# Patient Record
Sex: Female | Born: 1950
Health system: Southern US, Community
[De-identification: ages and names within clinical notes are randomized; demographics above are authoritative.]

## PROBLEM LIST (undated history)

## (undated) DIAGNOSIS — D45 Polycythemia vera: Secondary | ICD-10-CM

## (undated) DIAGNOSIS — I10 Essential (primary) hypertension: Secondary | ICD-10-CM

---

## 2001-06-12 ENCOUNTER — Emergency Department (HOSPITAL_COMMUNITY): Admission: EM | Admit: 2001-06-12 | Discharge: 2001-06-12 | Payer: Self-pay | Admitting: Emergency Medicine

## 2001-06-12 ENCOUNTER — Encounter: Payer: Self-pay | Admitting: Emergency Medicine

## 2001-08-27 ENCOUNTER — Emergency Department (HOSPITAL_COMMUNITY): Admission: EM | Admit: 2001-08-27 | Discharge: 2001-08-28 | Payer: Self-pay | Admitting: Emergency Medicine

## 2001-08-28 ENCOUNTER — Encounter: Payer: Self-pay | Admitting: Emergency Medicine

## 2001-12-11 ENCOUNTER — Emergency Department (HOSPITAL_COMMUNITY): Admission: EM | Admit: 2001-12-11 | Discharge: 2001-12-12 | Payer: Self-pay | Admitting: Emergency Medicine

## 2003-12-09 ENCOUNTER — Emergency Department (HOSPITAL_COMMUNITY): Admission: EM | Admit: 2003-12-09 | Discharge: 2003-12-09 | Payer: Self-pay | Admitting: Family Medicine

## 2009-11-02 ENCOUNTER — Emergency Department (HOSPITAL_COMMUNITY): Admission: EM | Admit: 2009-11-02 | Discharge: 2009-11-02 | Payer: Self-pay | Admitting: Family Medicine

## 2010-05-01 ENCOUNTER — Inpatient Hospital Stay (HOSPITAL_COMMUNITY)
Admission: EM | Admit: 2010-05-01 | Discharge: 2010-05-04 | Payer: Self-pay | Source: Home / Self Care | Admitting: Emergency Medicine

## 2010-05-01 IMAGING — CR DG CHEST 2V
2 series · 2 of 2 positions shown · non-contrast
Comparison: None.

CLINICAL DATA: Cough and chest tightness

CHEST - 2 VIEW

[w chest lat]
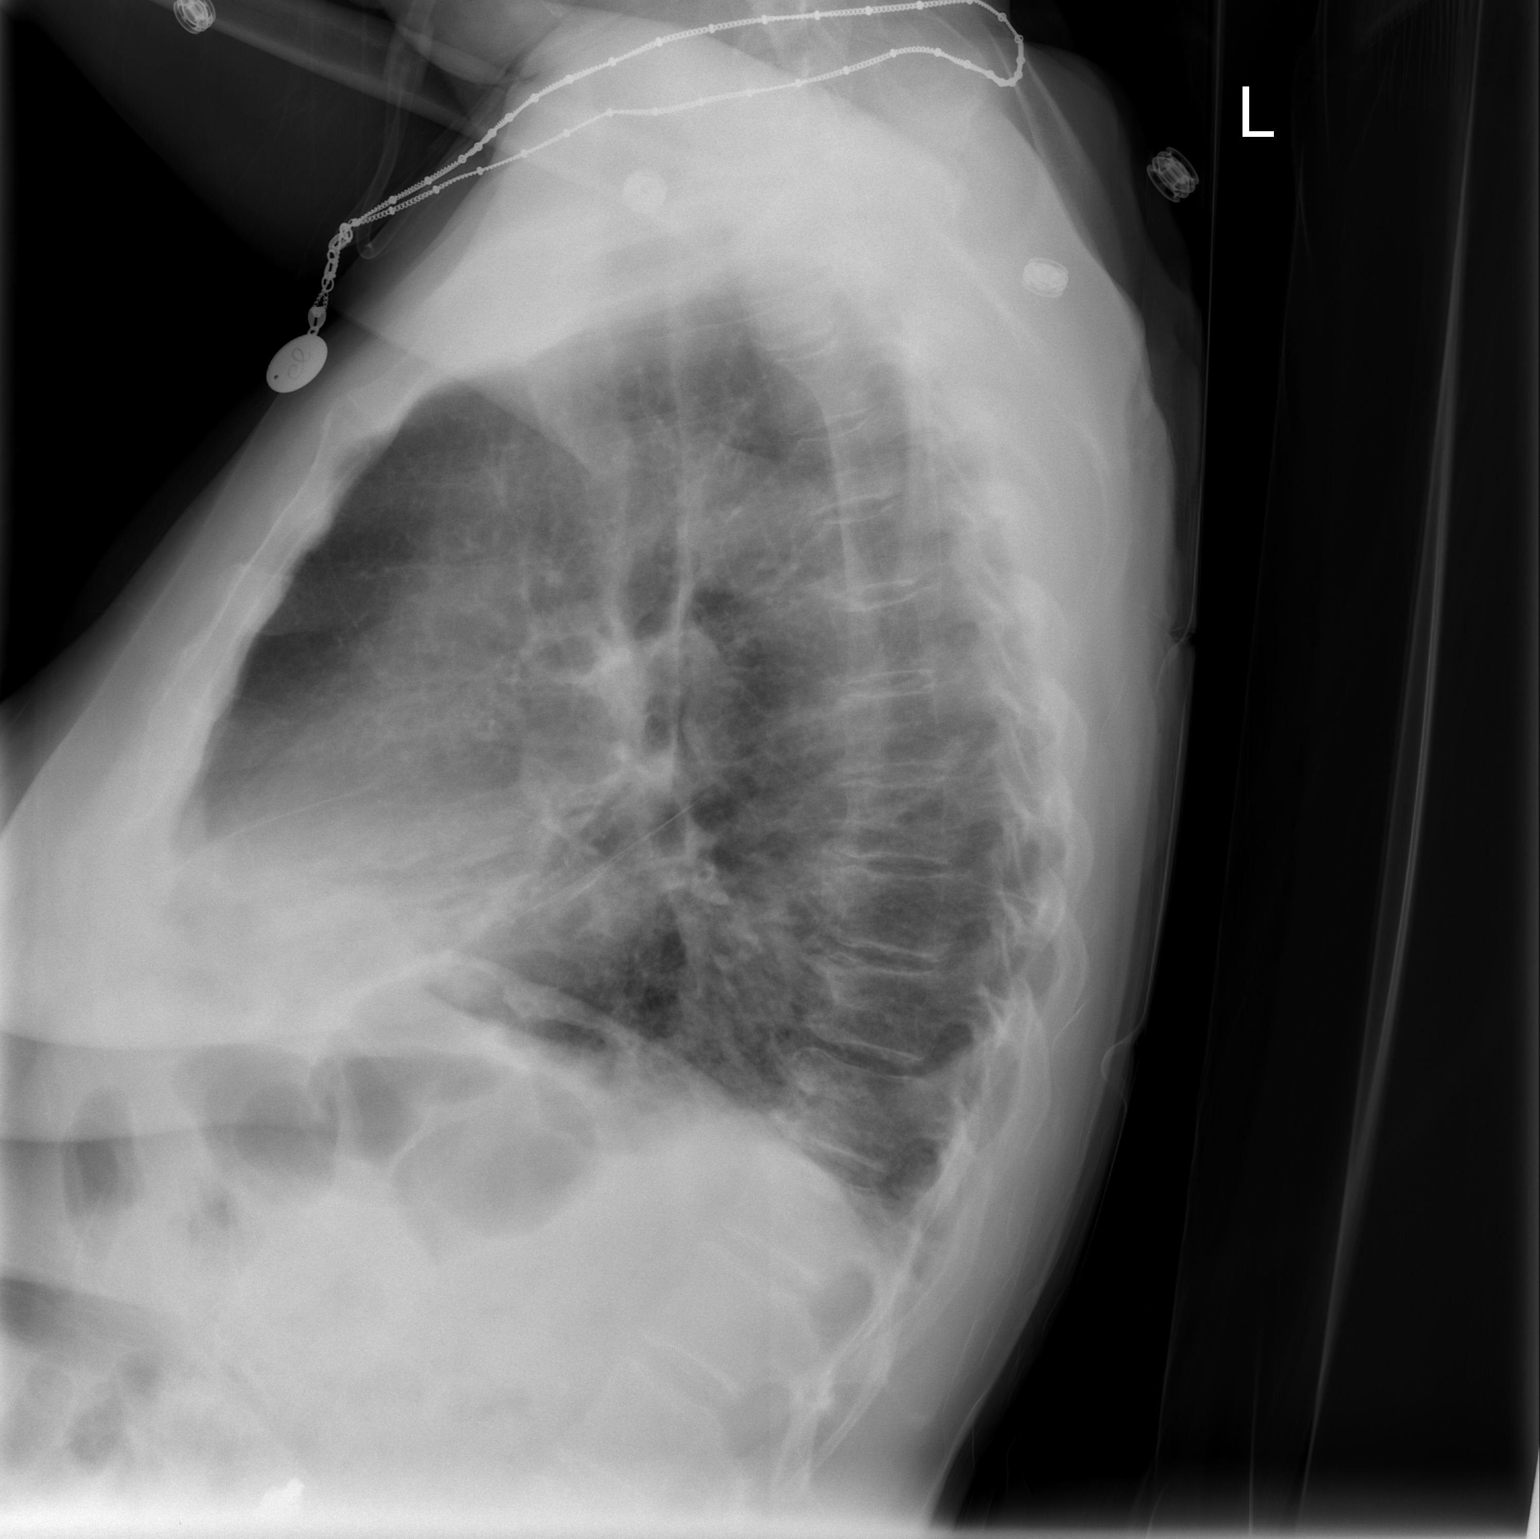

[view not recorded]
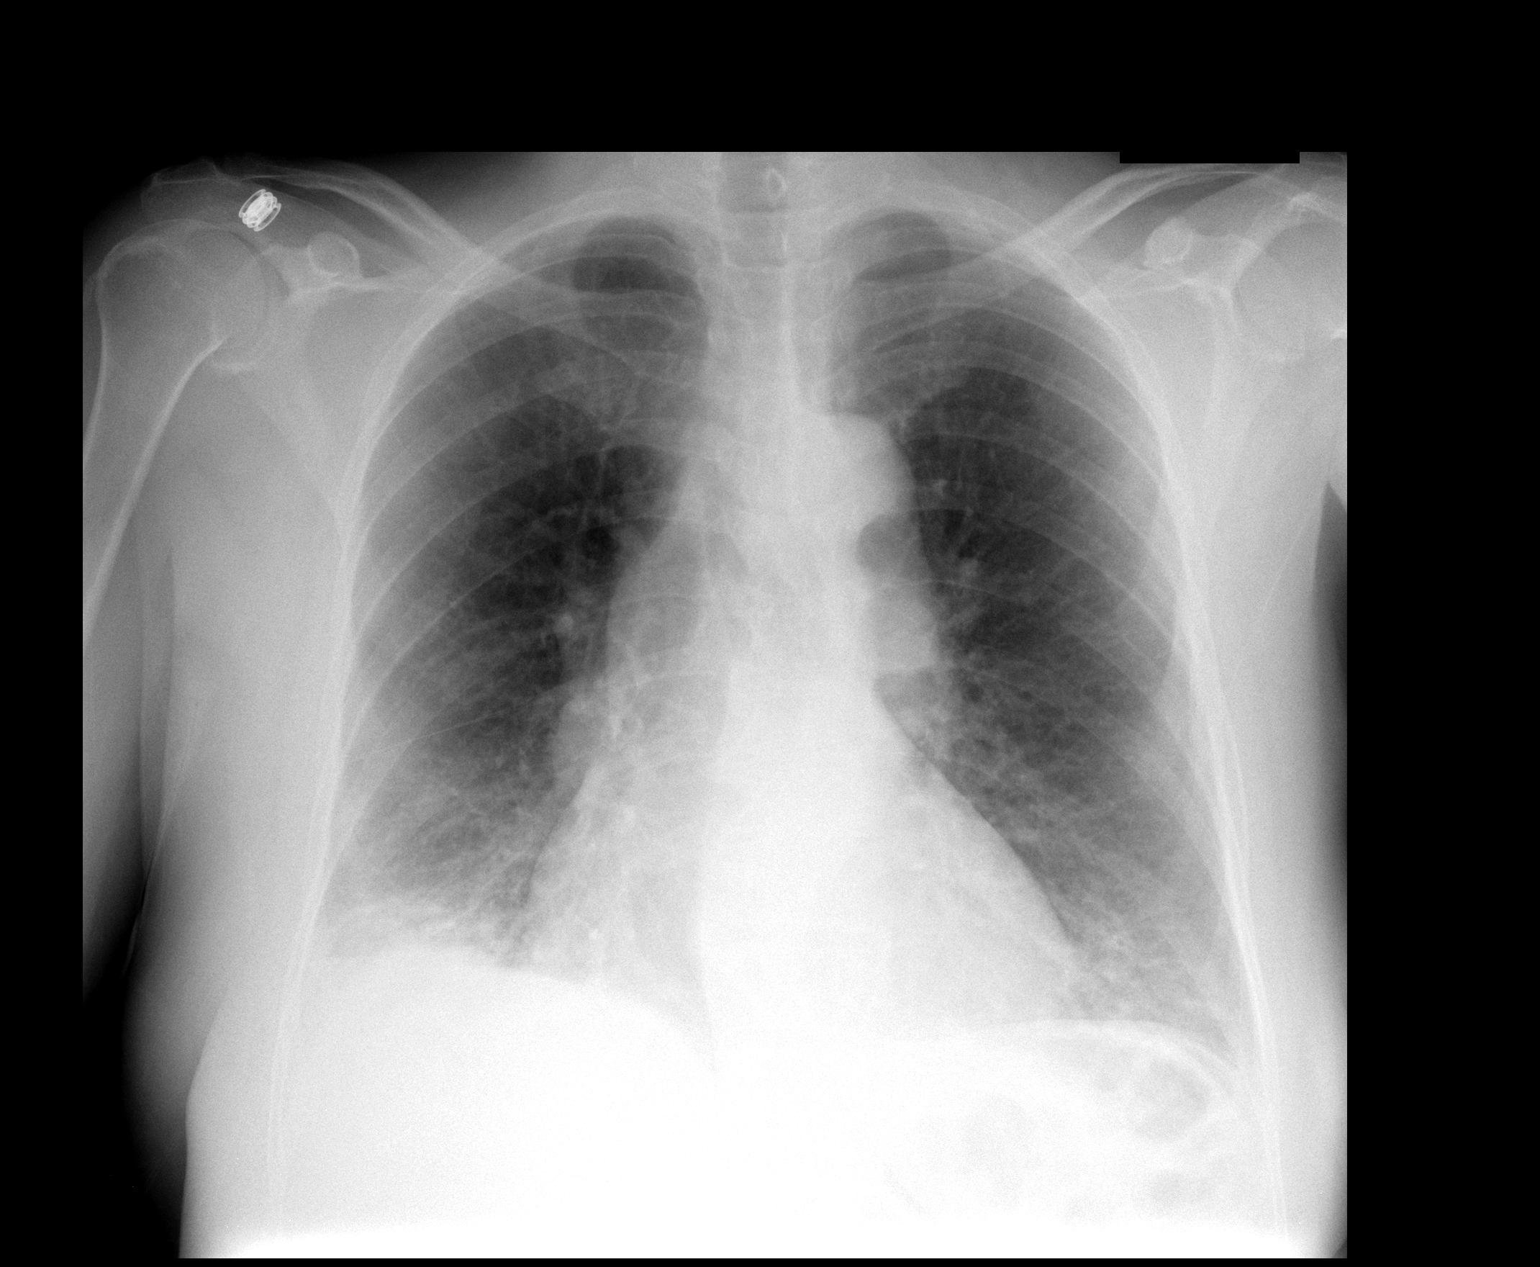

[2 of 2 positions shown; findings below may reference images not displayed]

FINDINGS: Increased opacity seen at the right lung base.  Vascular
congestion is present without overt edema.  The heart is normal in
size and contour.  The upper abdomen and osseous structures are
unremarkable.
IMPRESSION: Findings suggestive of right lower lobe pneumonia.  Radiographic
follow-up is recommended in 6-8 weeks to ensure clearing.

## 2010-05-02 IMAGING — CT CT CHEST W/O CM
2 of 4 series · 15 of 36 positions shown, 18 images · non-contrast
Comparison: Chest x-ray [DATE].

CLINICAL DATA: Abnormal chest x-ray.  Cough and shortness of
breath.

CT CHEST WITHOUT CONTRAST
TECHNIQUE: Multidetector CT imaging of the chest was performed
following the standard protocol without IV contrast.

[Series 2: chest w/o st · axial · non-contrast · 0.57mm/px · z∈[-262,-22]mm · 12 of 56 slices shown, 15 images]
[im 4/56  mediastinal]
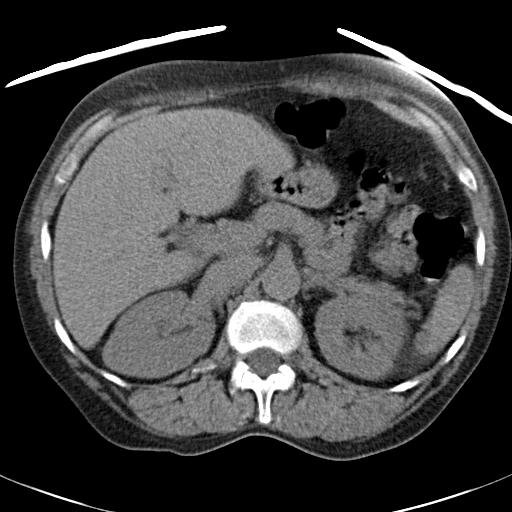
[im 4/56  lung]
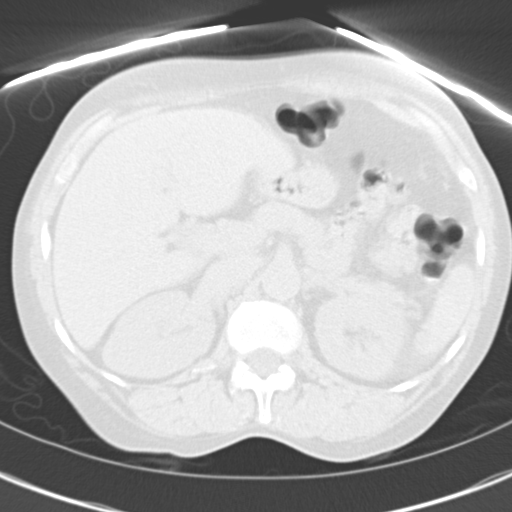
[im 8/56  lung]
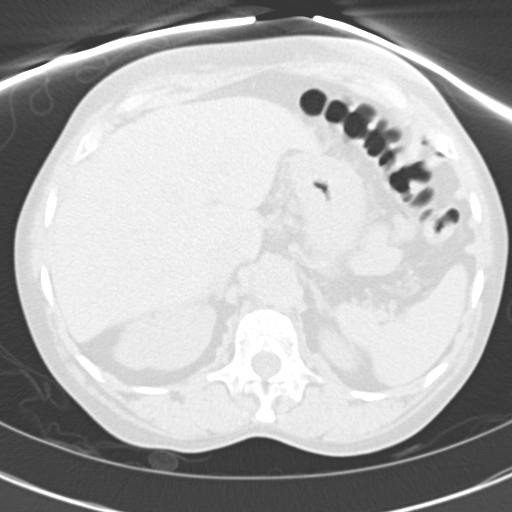
[im 12/56  lung]
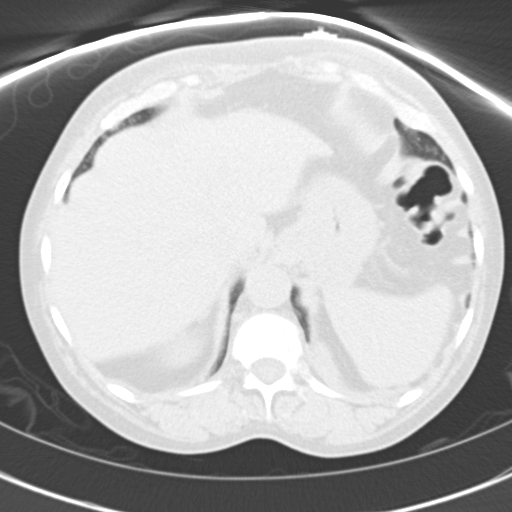
[im 16/56  lung]
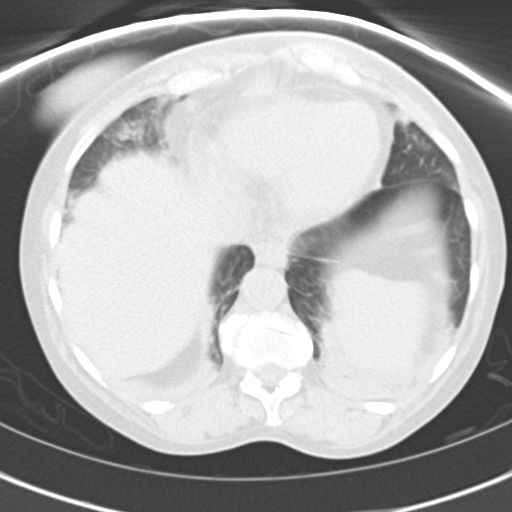
[im 20/56  mediastinal]
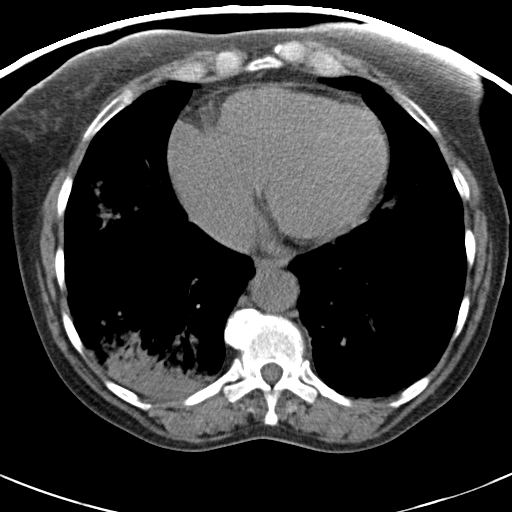
[im 20/56  lung]
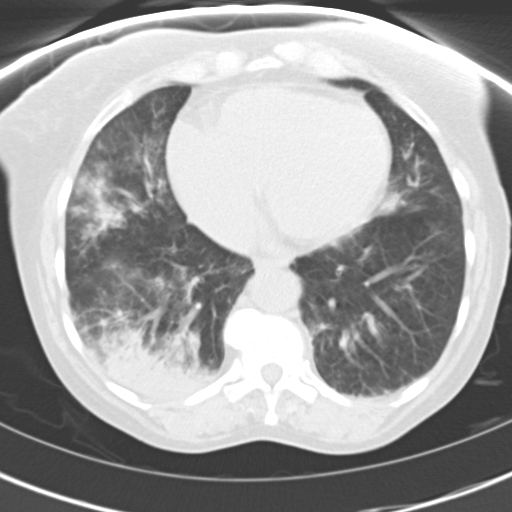
[im 24/56  lung]
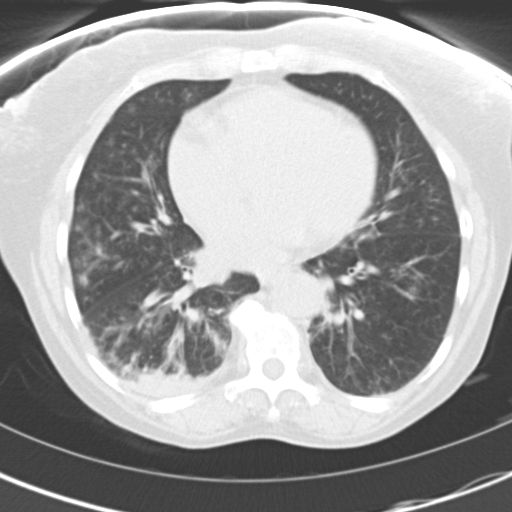
[im 32/56  lung]
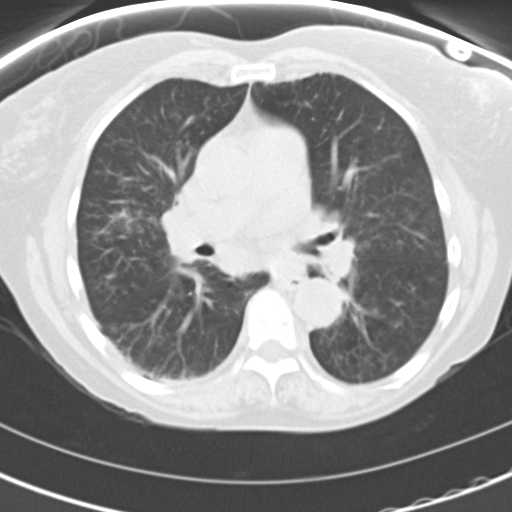
[im 36/56  lung]
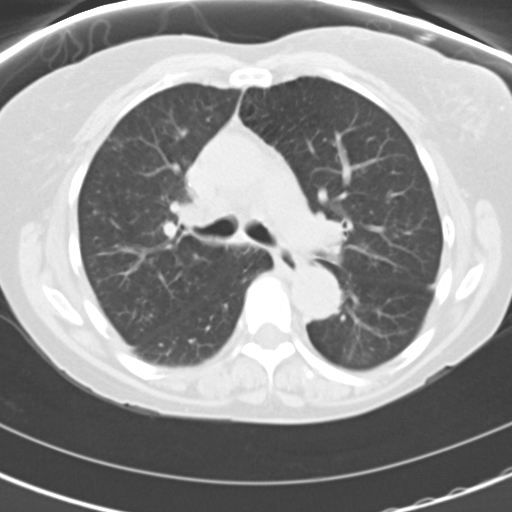
[im 40/56  mediastinal]
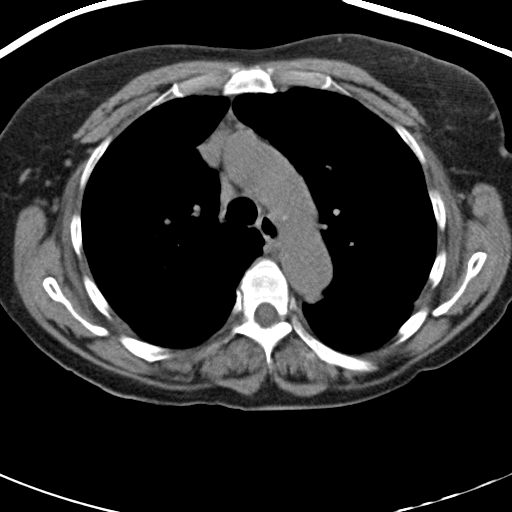
[im 40/56  lung]
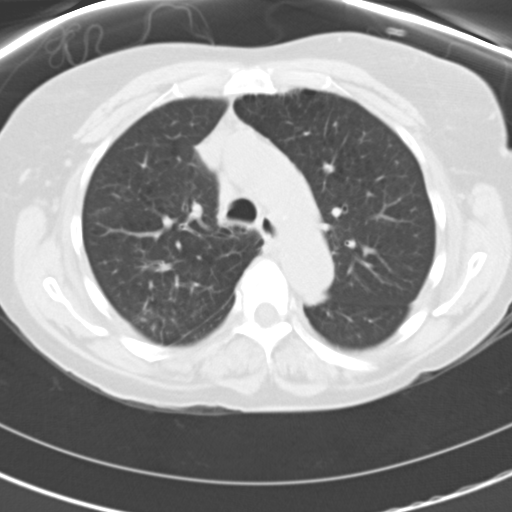
[im 44/56  lung]
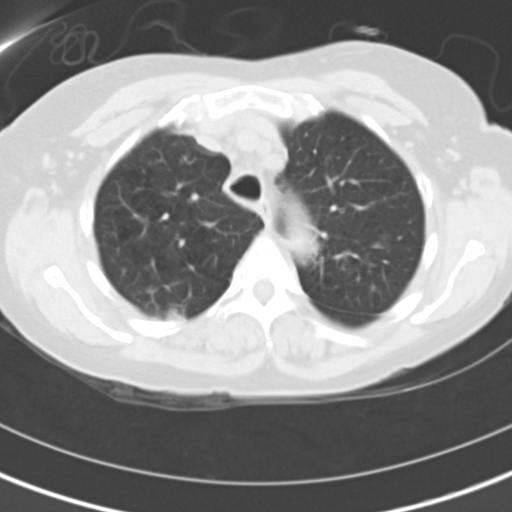
[im 48/56  lung]
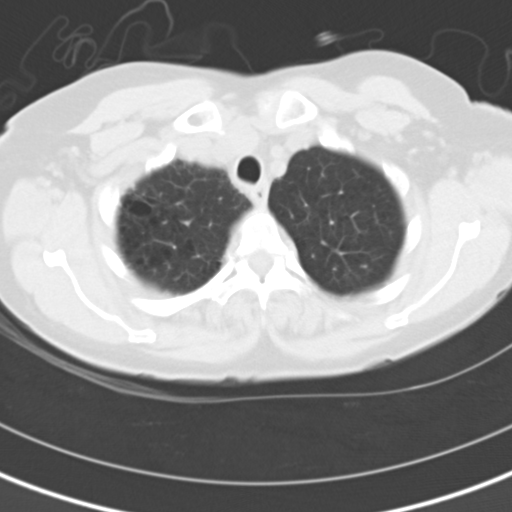
[im 52/56  lung]
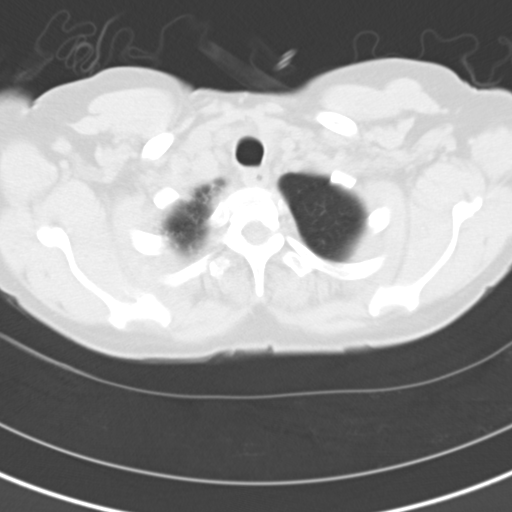

[Series 602: <mpr thick range> · coronal · 0.57mm/px · 3 of 103 slices shown]
[im 21/103  lung]
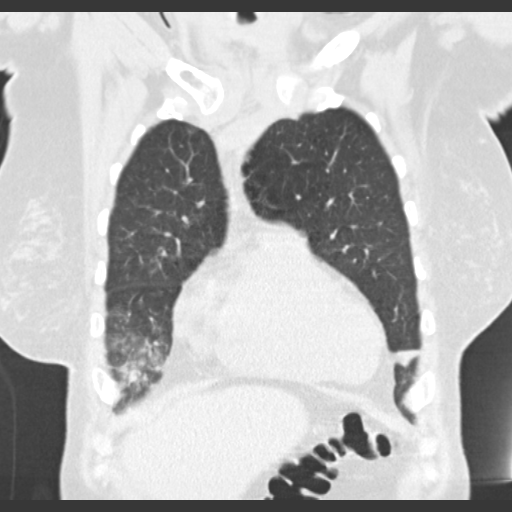
[im 41/103  lung]
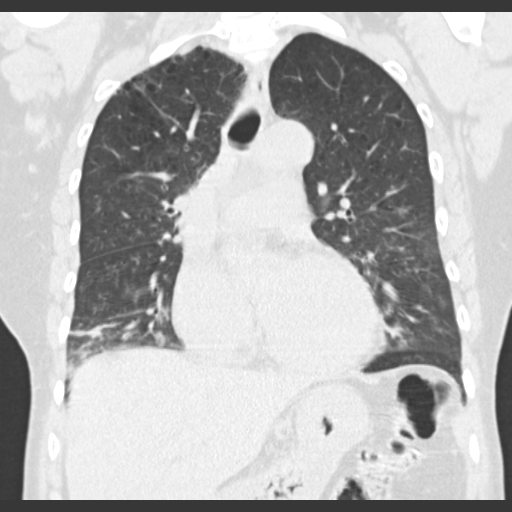
[im 62/103  lung]
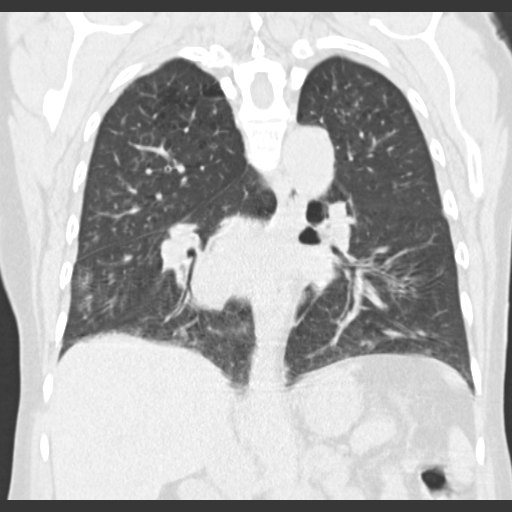

[15 of 36 positions shown; findings below may reference images not displayed]

FINDINGS: The chest wall is unremarkable.  No breast lesions,
supraclavicular or axillary adenopathy.  A 12 mm supraclavicular
node is noted on the right side and there are small scattered
axillary lymph nodes. The thyroid gland is grossly normal.

The bony thorax is intact.   No destructive bone lesions or spinal
canal compromise.  Moderate right-sided spurring changes involving
the lower thoracic vertebral bodies.

The heart is normal in size.  No pericardial effusion.  There are
borderline enlarged mediastinal lymph nodes.  The aorta is normal
in caliber.  Minimal atherosclerotic changes.  Coronary artery
calcifications are noted.  The esophagus is grossly normal.

Examination of the lung parenchyma demonstrates underlying changes
of emphysema.  There are fairly extensive patchy areas of tree in
bud appearance along with peribronchial thickening and more focal
lower lobe airspace consolidation.  Findings are almost certainly
due to an infectious process.  This could be a typical or atypical
infectious agent.  RUDOLPH would be a consideration.  No pulmonary
edema.  No pleural effusions.  No focal lesions.

The upper abdomen demonstrates no significant findings without
contrast.  The gastric fundal wall appears moderately thickened but
this may just be due to lack of distention.
IMPRESSION: 1.  Diffuse infectious process in the lungs with more focal lower
lobe airspace consolidation, right greater than left.
2.  Underlying changes of COPD.
3.  Borderline enlarged mediastinal lymph nodes likely due to the
lung findings.

## 2010-08-15 LAB — CBC
HCT: 44.2 % (ref 36.0–46.0)
Hemoglobin: 16.1 g/dL — ABNORMAL HIGH (ref 12.0–15.0)
MCH: 32 pg (ref 26.0–34.0)
MCHC: 33.8 g/dL (ref 30.0–36.0)
MCHC: 33.9 g/dL (ref 30.0–36.0)
Platelets: 161 10*3/uL (ref 150–400)
RDW: 13.8 % (ref 11.5–15.5)
RDW: 14.2 % (ref 11.5–15.5)

## 2010-08-15 LAB — POCT I-STAT, CHEM 8
BUN: 6 mg/dL (ref 6–23)
Chloride: 103 mEq/L (ref 96–112)
HCT: 54 % — ABNORMAL HIGH (ref 36.0–46.0)
Sodium: 136 mEq/L (ref 135–145)

## 2010-08-15 LAB — URINALYSIS, ROUTINE W REFLEX MICROSCOPIC
Glucose, UA: NEGATIVE mg/dL
Hgb urine dipstick: NEGATIVE
Nitrite: NEGATIVE
Protein, ur: 30 mg/dL — AB
Specific Gravity, Urine: 1.026 (ref 1.005–1.030)
Urobilinogen, UA: 1 mg/dL (ref 0.0–1.0)
pH: 5.5 (ref 5.0–8.0)

## 2010-08-15 LAB — DIFFERENTIAL
Basophils Absolute: 0 10*3/uL (ref 0.0–0.1)
Basophils Relative: 0 % (ref 0–1)
Neutro Abs: 8.6 10*3/uL — ABNORMAL HIGH (ref 1.7–7.7)
Neutrophils Relative %: 81 % — ABNORMAL HIGH (ref 43–77)

## 2010-08-15 LAB — RAPID URINE DRUG SCREEN, HOSP PERFORMED
Amphetamines: NOT DETECTED
Barbiturates: NOT DETECTED
Benzodiazepines: NOT DETECTED
Cocaine: NOT DETECTED
Opiates: NOT DETECTED

## 2010-08-15 LAB — CARDIAC PANEL(CRET KIN+CKTOT+MB+TROPI)
CK, MB: 1.3 ng/mL (ref 0.3–4.0)
CK, MB: 1.6 ng/mL (ref 0.3–4.0)
CK, MB: 1.9 ng/mL (ref 0.3–4.0)
Relative Index: INVALID (ref 0.0–2.5)
Total CK: 39 U/L (ref 7–177)
Total CK: 40 U/L (ref 7–177)
Total CK: 48 U/L (ref 7–177)
Troponin I: 0.01 ng/mL (ref 0.00–0.06)
Troponin I: 0.01 ng/mL (ref 0.00–0.06)

## 2010-08-15 LAB — CULTURE, BLOOD (ROUTINE X 2)
Culture  Setup Time: 201111290233
Culture: NO GROWTH

## 2010-08-15 LAB — COMPREHENSIVE METABOLIC PANEL
ALT: 10 U/L (ref 0–35)
CO2: 24 mEq/L (ref 19–32)
Calcium: 8.5 mg/dL (ref 8.4–10.5)
Creatinine, Ser: 0.65 mg/dL (ref 0.4–1.2)
GFR calc Af Amer: >60 mL/min (ref >60)
GFR calc non Af Amer: >60 mL/min (ref >60)
Glucose, Bld: 111 mg/dL — ABNORMAL HIGH (ref 70–99)
Sodium: 136 mEq/L (ref 135–145)
Total Protein: 6.6 g/dL (ref 6.0–8.3)

## 2010-08-15 LAB — ERYTHROPOIETIN: Erythropoietin: 9.6 m[IU]/mL (ref 2.6–34.0)

## 2010-08-15 LAB — LIPID PANEL
HDL: 56 mg/dL (ref >39)
LDL Cholesterol: 89 mg/dL (ref 0–99)
Triglycerides: 76 mg/dL (ref <150)

## 2010-08-15 LAB — URINE MICROSCOPIC-ADD ON

## 2010-08-15 LAB — URINE CULTURE

## 2010-08-15 LAB — TSH: TSH: 0.517 u[IU]/mL (ref 0.350–4.500)

## 2010-08-15 LAB — MAGNESIUM: Magnesium: 2 mg/dL (ref 1.5–2.5)

## 2010-08-15 LAB — POCT PREGNANCY, URINE: Preg Test, Ur: NEGATIVE

## 2010-08-21 LAB — POCT I-STAT, CHEM 8
Calcium, Ion: 1.09 mmol/L — ABNORMAL LOW (ref 1.12–1.32)
Chloride: 106 mEq/L (ref 96–112)
Glucose, Bld: 96 mg/dL (ref 70–99)
HCT: 48 % — ABNORMAL HIGH (ref 36.0–46.0)
TCO2: 27 mmol/L (ref 0–100)

## 2010-08-21 LAB — POCT URINALYSIS DIP (DEVICE)
Bilirubin Urine: NEGATIVE
Hgb urine dipstick: NEGATIVE
Nitrite: NEGATIVE
Protein, ur: NEGATIVE mg/dL
pH: 5.5 (ref 5.0–8.0)

## 2011-09-08 ENCOUNTER — Encounter (HOSPITAL_COMMUNITY): Payer: Self-pay | Admitting: *Deleted

## 2011-09-08 ENCOUNTER — Other Ambulatory Visit: Payer: Self-pay

## 2011-09-08 ENCOUNTER — Emergency Department (HOSPITAL_COMMUNITY)
Admission: EM | Admit: 2011-09-08 | Discharge: 2011-09-08 | Disposition: A | Discharge status: Home / Self Care | Payer: Managed Care, Other (non HMO) | Source: Home / Self Care | Attending: Emergency Medicine | Admitting: Emergency Medicine

## 2011-09-08 DIAGNOSIS — H6120 Impacted cerumen, unspecified ear: Secondary | ICD-10-CM

## 2011-09-08 DIAGNOSIS — I10 Essential (primary) hypertension: Secondary | ICD-10-CM

## 2011-09-08 DIAGNOSIS — F17200 Nicotine dependence, unspecified, uncomplicated: Secondary | ICD-10-CM

## 2011-09-08 HISTORY — DX: Polycythemia vera: D45

## 2011-09-08 HISTORY — DX: Essential (primary) hypertension: I10

## 2011-09-08 LAB — POCT I-STAT, CHEM 8
BUN: 6 mg/dL (ref 6–23)
Calcium, Ion: 1.16 mmol/L (ref 1.12–1.32)
Creatinine, Ser: 0.8 mg/dL (ref 0.50–1.10)
Hemoglobin: 17 g/dL — ABNORMAL HIGH (ref 12.0–15.0)
TCO2: 23 mmol/L (ref 0–100)

## 2011-09-08 MED ORDER — NEOMYCIN-POLYMYXIN-HC 3.5-10000-1 OT SUSP: 4.0000 [drp] | Freq: Three times a day (TID) | OTIC | Status: AC

## 2011-09-08 MED ORDER — DOCUSATE SODIUM 50 MG/5ML PO LIQD
ORAL | Status: AC
  Filled 2011-09-08: qty 10

## 2011-09-08 MED ORDER — TRIAMTERENE-HCTZ 37.5-25 MG PO TABS: 1.0000 | ORAL_TABLET | Freq: Every day | ORAL | Status: DC

## 2011-09-08 NOTE — ED Notes (Signed)
C/O right ear feeling plugged; denies pain.  Denies cold sxs.

## 2011-09-08 NOTE — Discharge Instructions (Signed)
We discuss several things today most importantly is the need for you to followup and establish her primary care Dr. information has been given to you as well for smoking cessation you are at high risk of a heart attack, stroke and even death. Have discussed with you your risk factors a blood pressure pill for the next 60 days she will need followup with continuity of care. We are going to be unable to refill your medicines in the future.   If YOU ARE READY TO QUIT SMOKING PLEASE CALL   [name] who is the hospital's smoking cessation specialist  [phone]     Arterial Hypertension Arterial hypertension (high blood pressure) is a condition of elevated pressure in your blood vessels. Hypertension over a long period of time is a risk factor for strokes, heart attacks, and heart failure. It is also the leading cause of kidney (renal) failure.  CAUSES   In Adults -- Over 90% of all hypertension has no known cause. This is called essential or primary hypertension. In the other 10% of people with hypertension, the increase in blood pressure is caused by another disorder. This is called secondary hypertension. Important causes of secondary hypertension are:   Heavy alcohol use.   Obstructive sleep apnea.   Hyperaldosterosim (Conn's syndrome).   Steroid use.   Chronic kidney failure.   Hyperparathyroidism.   Medications.   Renal artery stenosis.   Pheochromocytoma.   Cushing's disease.   Coarctation of the aorta.   Scleroderma renal crisis.   Licorice (in excessive amounts).   Drugs (cocaine, methamphetamine).  Your caregiver can explain any items above that apply to you.  In Children -- Secondary hypertension is more common and should always be considered.   Pregnancy -- Few women of childbearing age have high blood pressure. However, up to 10% of them develop hypertension of pregnancy. Generally, this will not harm the woman. It may be a sign of 3 complications of  pregnancy: preeclampsia, HELLP syndrome, and eclampsia. Follow up and control with medication is necessary.  SYMPTOMS   This condition normally does not produce any noticeable symptoms. It is usually found during a routine exam.   Malignant hypertension is a late problem of high blood pressure. It may have the following symptoms:   Headaches.   Blurred vision.   End-organ damage (this means your kidneys, heart, lungs, and other organs are being damaged).   Stressful situations can increase the blood pressure. If a person with normal blood pressure has their blood pressure go up while being seen by their caregiver, this is often termed "white coat hypertension." Its importance is not known. It may be related with eventually developing hypertension or complications of hypertension.   Hypertension is often confused with mental tension, stress, and anxiety.  DIAGNOSIS  The diagnosis is made by 3 separate blood pressure measurements. They are taken at least 1 week apart from each other. If there is organ damage from hypertension, the diagnosis may be made without repeat measurements. Hypertension is usually identified by having blood pressure readings:  Above 140/90 mmHg measured in both arms, at 3 separate times, over a couple weeks.   Over 130/80 mmHg should be considered a risk factor and may require treatment in patients with diabetes.  Blood pressure readings over 120/80 mmHg are called "pre-hypertension" even in non-diabetic patients. To get a true blood pressure measurement, use the following guidelines. Be aware of the factors that can alter blood pressure readings.  Take measurements  at least 1 hour after caffeine.   Take measurements 30 minutes after smoking and without any stress. This is another reason to quit smoking - it raises your blood pressure.   Use a proper cuff size. Ask your caregiver if you are not sure about your cuff size.   Most home blood pressure cuffs are  automatic. They will measure systolic and diastolic pressures. The systolic pressure is the pressure reading at the start of sounds. Diastolic pressure is the pressure at which the sounds disappear. If you are elderly, measure pressures in multiple postures. Try sitting, lying or standing.   Sit at rest for a minimum of 5 minutes before taking measurements.   You should not be on any medications like decongestants. These are found in many cold medications.   Record your blood pressure readings and review them with your caregiver.  If you have hypertension:  Your caregiver may do tests to be sure you do not have secondary hypertension (see "causes" above).   Your caregiver may also look for signs of metabolic syndrome. This is also called Syndrome X or Insulin Resistance Syndrome. You may have this syndrome if you have type 2 diabetes, abdominal obesity, and abnormal blood lipids in addition to hypertension.   Your caregiver will take your medical and family history and perform a physical exam.   Diagnostic tests may include blood tests (for glucose, cholesterol, potassium, and kidney function), a urinalysis, or an EKG. Other tests may also be necessary depending on your condition.  PREVENTION  There are important lifestyle issues that you can adopt to reduce your chance of developing hypertension:  Maintain a normal weight.   Limit the amount of salt (sodium) in your diet.   Exercise often.   Limit alcohol intake.   Get enough potassium in your diet. Discuss specific advice with your caregiver.   Follow a DASH diet (dietary approaches to stop hypertension). This diet is rich in fruits, vegetables, and low-fat dairy products, and avoids certain fats.  PROGNOSIS  Essential hypertension cannot be cured. Lifestyle changes and medical treatment can lower blood pressure and reduce complications. The prognosis of secondary hypertension depends on the underlying cause. Many people whose  hypertension is controlled with medicine or lifestyle changes can live a normal, healthy life.  RISKS AND COMPLICATIONS  While high blood pressure alone is not an illness, it often requires treatment due to its short- and long-term effects on many organs. Hypertension increases your risk for:  CVAs or strokes (cerebrovascular accident).   Heart failure due to chronically high blood pressure (hypertensive cardiomyopathy).   Heart attack (myocardial infarction).   Damage to the retina (hypertensive retinopathy).   Kidney failure (hypertensive nephropathy).  Your caregiver can explain list items above that apply to you. Treatment of hypertension can significantly reduce the risk of complications. TREATMENT   For overweight patients, weight loss and regular exercise are recommended. Physical fitness lowers blood pressure.   Mild hypertension is usually treated with diet and exercise. A diet rich in fruits and vegetables, fat-free dairy products, and foods low in fat and salt (sodium) can help lower blood pressure. Decreasing salt intake decreases blood pressure in a 1/3 of people.   Stop smoking if you are a smoker.  The steps above are highly effective in reducing blood pressure. While these actions are easy to suggest, they are difficult to achieve. Most patients with moderate or severe hypertension end up requiring medications to bring their blood pressure down to a normal  level. There are several classes of medications for treatment. Blood pressure pills (antihypertensives) will lower blood pressure by their different actions. Lowering the blood pressure by 10 mmHg may decrease the risk of complications by as much as 25%. The goal of treatment is effective blood pressure control. This will reduce your risk for complications. Your caregiver will help you determine the best treatment for you according to your lifestyle. What is excellent treatment for one person, may not be for you. HOME CARE  INSTRUCTIONS   Do not smoke.   Follow the lifestyle changes outlined in the "Prevention" section.   If you are on medications, follow the directions carefully. Blood pressure medications must be taken as prescribed. Skipping doses reduces their benefit. It also puts you at risk for problems.   Follow up with your caregiver, as directed.   If you are asked to monitor your blood pressure at home, follow the guidelines in the "Diagnosis" section above.  SEEK MEDICAL CARE IF:   You think you are having medication side effects.   You have recurrent headaches or lightheadedness.   You have swelling in your ankles.   You have trouble with your vision.  SEEK IMMEDIATE MEDICAL CARE IF:   You have sudden onset of chest pain or pressure, difficulty breathing, or other symptoms of a heart attack.   You have a severe headache.   You have symptoms of a stroke (such as sudden weakness, difficulty speaking, difficulty walking).  MAKE SURE YOU:   Understand these instructions.   Will watch your condition.   Will get help right away if you are not doing well or get worse.  Document Released: 05/21/2005 Document Revised: 05/10/2011 Document Reviewed: 12/19/2006 [facility] Patient Information 2012 [place], [place].Cerumen Impaction The structures of the external ear canal secrete a waxy substance known as cerumen. Excess cerumen can build up in the ear canal, causing a condition known as cerumen impaction. Cerumen impaction can cause ear pain as well as disrupt the function of the ear. The rate of cerumen production differs for each individual. For certain individuals, the configuration of one's ear canal may cause him or her to have a decreased ability to naturally remove cerumen. It is important to note that removing cerumen as a part of normal hygiene is not necessary, and the use of swabs in the ear canal is not recommended. SYMPTOMS   Diminished hearing.   Ear drainage.   Ear pain.   Ear  itch.  CAUSES  Excessive cerumen production.  RISK INCREASES WITH:  Frequent use of swabs to clean ears.   Narrow ear canals.   Eczema (a skin condition).   Dehydration.  PREVENTION  Do Not insert objects into the ear, even with the intent of cleaning the ear.   Maintain hydration.   Control eczema if present.  TREATMENT  Maintaining preventative measures is the best way to treat cerumen impaction. If symptoms of cerumen impaction develop the first step is to use over-the-counter or prescription ear drops that are intended to soften the cerumen. If the cerumen does not clear, then visit your caregiver to have the cerumen removed. The most common method for cerumen removal is through irrigation with warm water. Although, some caregivers use ear curettes and other instruments to remove the cerumen physically. In the most severe cases, cerumen may be removed surgically. Document Released: 05/21/2005 Document Revised: 05/10/2011 Document Reviewed: 09/02/2008 [facility] Patient Information 2012 [place], [place].Cerumen Impaction A cerumen impaction is when the wax in your ear  forms a plug. This plug usually causes reduced hearing. Sometimes it also causes an earache or dizziness. Removing a cerumen impaction can be difficult and painful. The wax sticks to the ear canal. The canal is sensitive and bleeds easily. If you try to remove a heavy wax buildup with a cotton tipped swab, you may push it in further. Irrigation with water, suction, and small ear curettes may be used to clear out the wax. If the impaction is fixed to the skin in the ear canal, ear drops may be needed for a few days to loosen the wax. People who build up a lot of wax frequently can use ear wax removal products available in your local drugstore. SEEK MEDICAL CARE IF:  You develop an earache, increased hearing loss, or marked dizziness. Document Released: 06/28/2004 Document Revised: 05/10/2011 Document Reviewed:  08/18/2009 [facility] Patient Information 2012 [place], [place].Cerumen Impaction A cerumen impaction is when the wax in your ear forms a plug. This plug usually causes reduced hearing. Sometimes it also causes an earache or dizziness. Removing a cerumen impaction can be difficult and painful. The wax sticks to the ear canal. The canal is sensitive and bleeds easily. If you try to remove a heavy wax buildup with a cotton tipped swab, you may push it in further. Irrigation with water, suction, and small ear curettes may be used to clear out the wax. If the impaction is fixed to the skin in the ear canal, ear drops may be needed for a few days to loosen the wax. People who build up a lot of wax frequently can use ear wax removal products available in your local drugstore. SEEK MEDICAL CARE IF:  You develop an earache, increased hearing loss, or marked dizziness. Document Released: 06/28/2004 Document Revised: 05/10/2011 Document Reviewed: 08/18/2009 [facility] Patient Information 2012 [place], [place].Cerumen Impaction A cerumen impaction is when the wax in your ear forms a plug. This plug usually causes reduced hearing. Sometimes it also causes an earache or dizziness. Removing a cerumen impaction can be difficult and painful. The wax sticks to the ear canal. The canal is sensitive and bleeds easily. If you try to remove a heavy wax buildup with a cotton tipped swab, you may push it in further. Irrigation with water, suction, and small ear curettes may be used to clear out the wax. If the impaction is fixed to the skin in the ear canal, ear drops may be needed for a few days to loosen the wax. People who build up a lot of wax frequently can use ear wax removal products available in your local drugstore. SEEK MEDICAL CARE IF:  You develop an earache, increased hearing loss, or marked dizziness. Document Released: 06/28/2004 Document Revised: 05/10/2011 Document Reviewed: 08/18/2009 [facility] Patient Information  2012 [place], [place].

## 2011-09-08 NOTE — ED Provider Notes (Signed)
History     CSN: [id number]  Arrival date & time 09/08/11  7829   First MD Initiated Contact with Patient 09/08/11 [id number]      Chief Complaint  Patient presents with  . Ear Fullness    (Consider location/radiation/quality/duration/timing/severity/associated sxs/prior treatment) HPI Comments: Patient presents to urgent care today complaining that she her right ear feels plugged and obstructed it's really not painful. Patient denies any recent cough congestion fevers or any dizziness or headaches.  Patient during interview denies any chronic medical problems further chart review we noticed several things and with her recent hospitalization. He also address her hypertension.  Patient is a 61 y.o. female presenting with plugged ear sensation. The history is provided by the patient.  Ear Fullness This is a new problem. The current episode started more than 1 week ago. The problem occurs constantly. Pertinent negatives include no chest pain, no headaches and no shortness of breath. The symptoms are aggravated by nothing. The symptoms are relieved by nothing. She has tried nothing for the symptoms.    Past Medical History  Diagnosis Date  . Hypertension   . Polycythemia vera     History reviewed. No pertinent past surgical history.  History reviewed. No pertinent family history.  History  Substance Use Topics  . Smoking status: Current Everyday Smoker -- 1.0 packs/day    Types: Cigarettes  . Smokeless tobacco: Not on file  . Alcohol Use: 4.2 oz/week    7 Cans of beer per week    OB History    Grav Para Term Preterm Abortions TAB SAB Ect Mult Living                  Review of Systems  Constitutional: Negative for chills, diaphoresis, activity change and fatigue.  HENT: Negative for hearing loss, ear pain, neck pain and ear discharge.   Eyes: Negative for pain.  Respiratory: Negative for cough and shortness of breath.   Cardiovascular: Negative for chest pain, palpitations  and leg swelling.  Genitourinary: Negative for dyspareunia.  Skin: Negative for rash.  Neurological: Negative for dizziness, seizures, syncope, weakness, light-headedness and headaches.    Allergies  Review of patient's allergies indicates no known allergies.  Home Medications   Current Outpatient Rx  Name Route Sig Dispense Refill  . NEOMYCIN-POLYMYXIN-HC 3.5-10000-1 OT SUSP Right Ear Place 4 drops into the right ear 3 (three) times daily. 7.45 mL 0  . TRIAMTERENE-HCTZ 37.5-25 MG PO TABS Oral Take 1 each (1 tablet total) by mouth daily. 60 tablet 0    BP 162/108  Pulse 110  Temp(Src) 99.4 F (37.4 C) (Oral)  Resp 18  SpO2 93%  Physical Exam  Nursing note and vitals reviewed. Constitutional: She appears well-developed and well-nourished. No distress.  HENT:  Head: Normocephalic.  Right Ear: Hearing, tympanic membrane and ear canal normal.  Ears:  Eyes: Conjunctivae are normal.  Neck: Neck supple.    ED Course  Procedures (including critical care time)  Labs Reviewed  POCT I-STAT, CHEM 8 - Abnormal; Notable for the following:    Hemoglobin 17.0 (*)    HCT 50.0 (*)    All other components within normal limits   No results found.   1. Cerumen impaction   2. Hypertension   3. Smoking addiction       MDM  Right ear cerumen impaction. Patient noted to be hypertensive and tachycardic. Patient revealed that she has never followup with any primary care Dr. even after hospitalization continues to  be a heavy smoker. Patient was started on antihypertensives further cardiovascular evaluation occur with long explanation of risks factors and risk for a stroke or heart attack she has several risk factors. We provided with a smoking cessation contact she expresses she might be willing to quit smoking. We also provided resources for to establish her primary care Dr. patient agree with treatment plan followup and is aware of her risk factors        Jimmie Molly,  MD 09/08/11 1642

## 2013-01-25 ENCOUNTER — Emergency Department (HOSPITAL_COMMUNITY): Payer: BC Managed Care – PPO

## 2013-01-25 ENCOUNTER — Encounter (HOSPITAL_COMMUNITY): Payer: Self-pay | Admitting: Cardiology

## 2013-01-25 ENCOUNTER — Emergency Department (HOSPITAL_COMMUNITY)
Admission: EM | Admit: 2013-01-25 | Discharge: 2013-01-25 | Disposition: A | Discharge status: Home / Self Care | Payer: BC Managed Care – PPO | Attending: Emergency Medicine | Admitting: Emergency Medicine

## 2013-01-25 DIAGNOSIS — M25551 Pain in right hip: Secondary | ICD-10-CM

## 2013-01-25 DIAGNOSIS — R296 Repeated falls: Secondary | ICD-10-CM | POA: Insufficient documentation

## 2013-01-25 DIAGNOSIS — S79919A Unspecified injury of unspecified hip, initial encounter: Secondary | ICD-10-CM | POA: Insufficient documentation

## 2013-01-25 DIAGNOSIS — Z87891 Personal history of nicotine dependence: Secondary | ICD-10-CM | POA: Insufficient documentation

## 2013-01-25 DIAGNOSIS — R269 Unspecified abnormalities of gait and mobility: Secondary | ICD-10-CM | POA: Insufficient documentation

## 2013-01-25 DIAGNOSIS — I10 Essential (primary) hypertension: Secondary | ICD-10-CM | POA: Insufficient documentation

## 2013-01-25 DIAGNOSIS — Y929 Unspecified place or not applicable: Secondary | ICD-10-CM | POA: Insufficient documentation

## 2013-01-25 DIAGNOSIS — Y939 Activity, unspecified: Secondary | ICD-10-CM | POA: Insufficient documentation

## 2013-01-25 IMAGING — CR DG HIP COMPLETE 2+V*R*
3 series · 3 of 3 positions shown · non-contrast
Comparison: None.

CLINICAL DATA: Right hip pain status post fall.

EXAM:
RIGHT HIP - COMPLETE 2+ VIEW

[t pelvis a.p.]
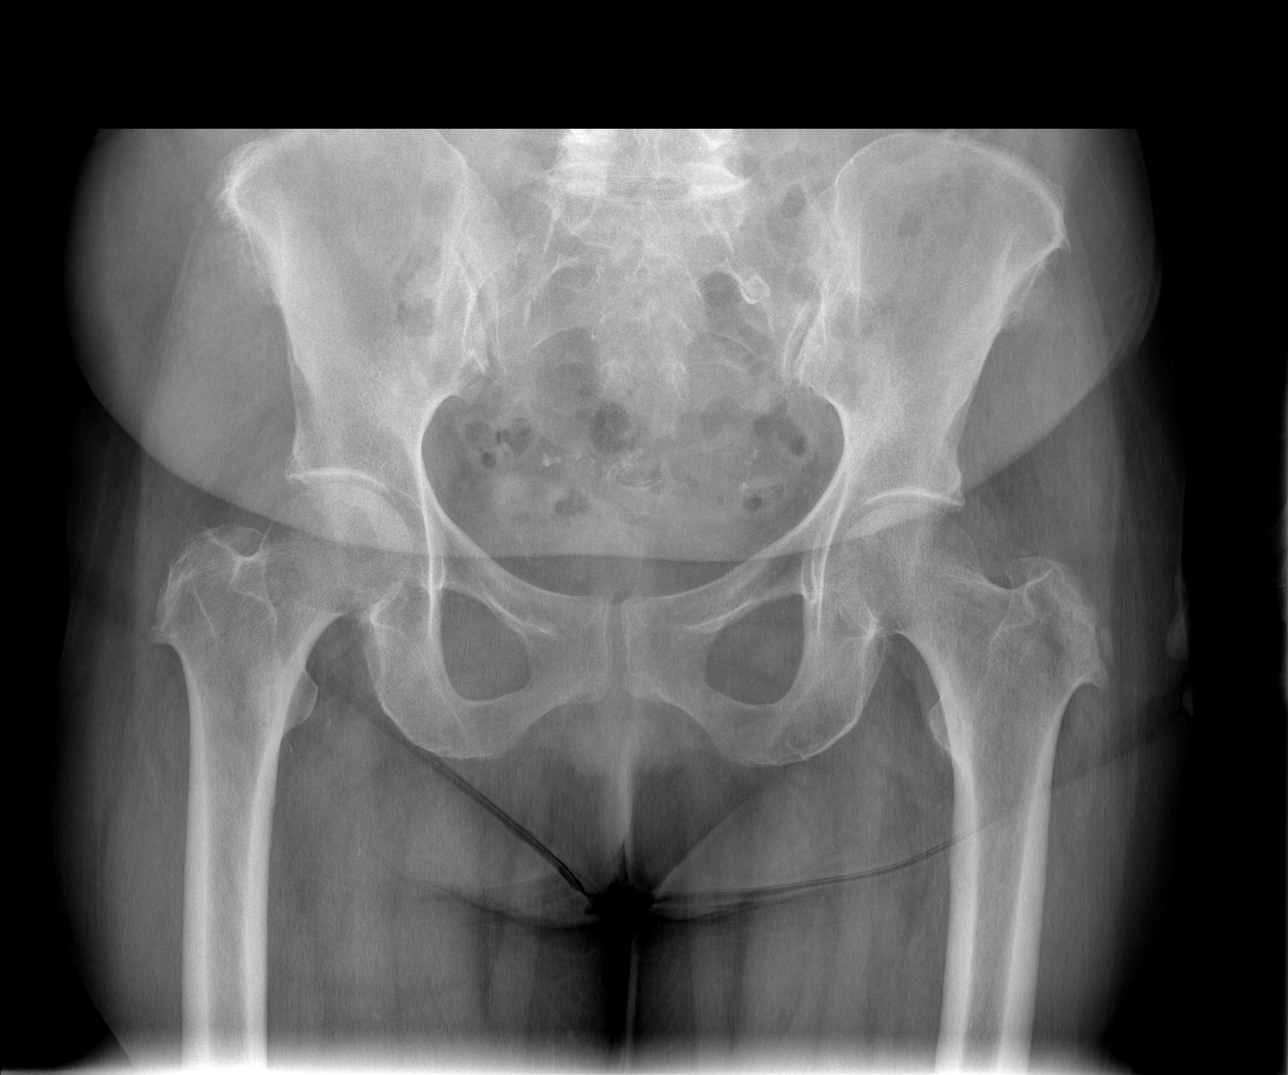

[t hip ap right]
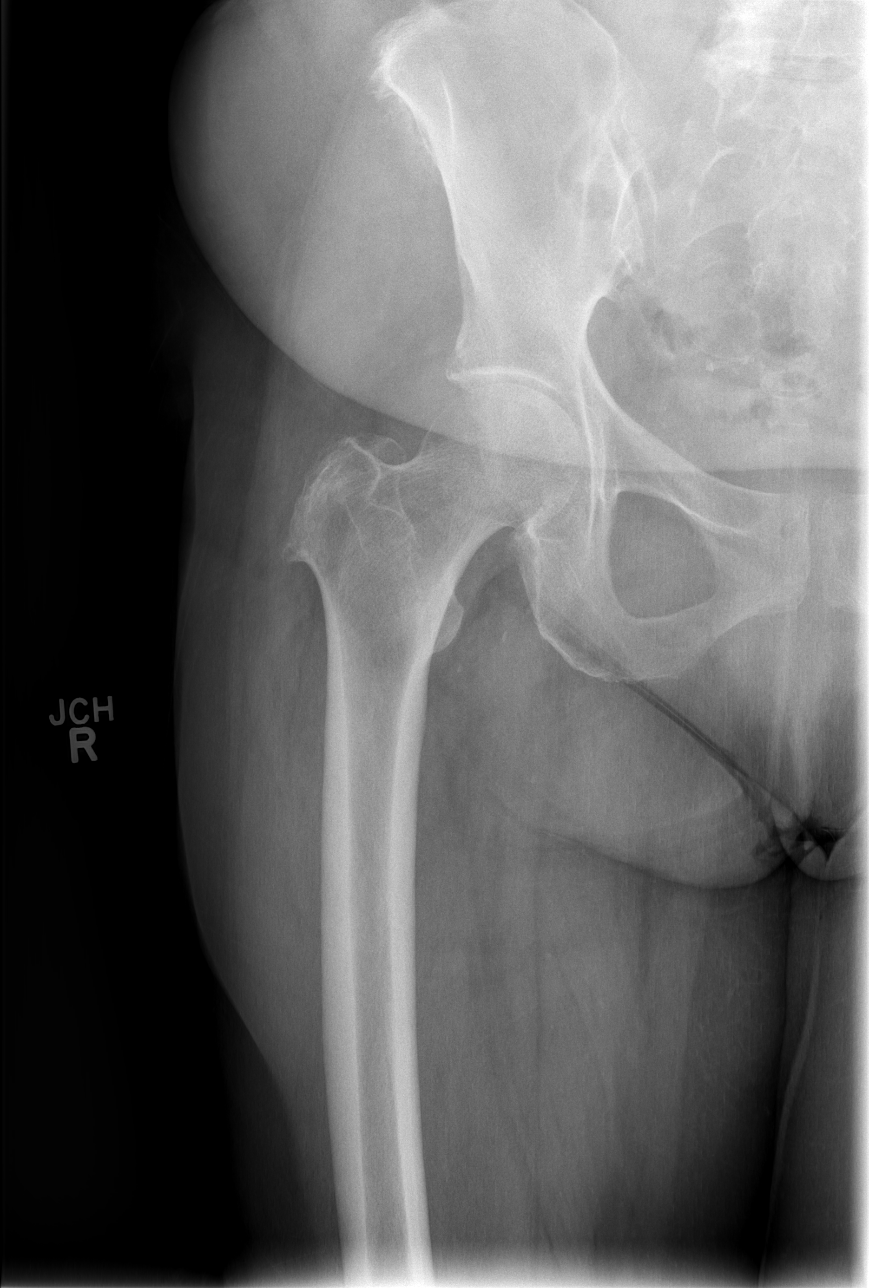

[t hip frog leg right]
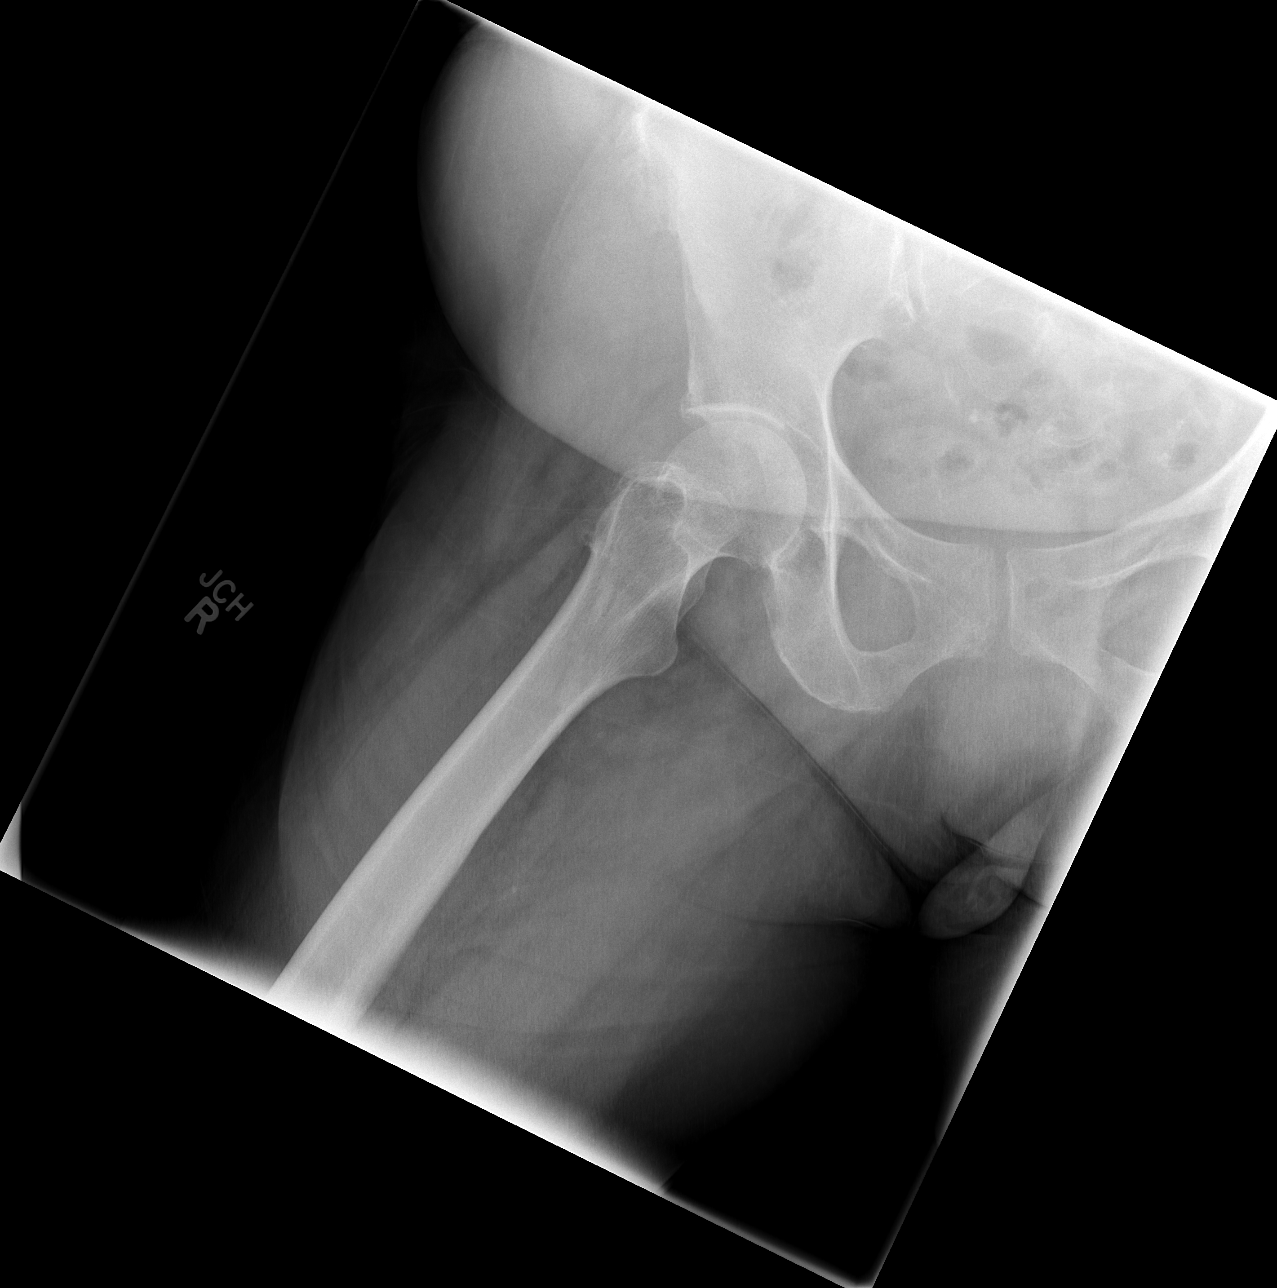

[3 of 3 positions shown; findings below may reference images not displayed]

FINDINGS: The bones appear mildly demineralized. There is no evidence of acute
fracture or dislocation. The hip joint spaces are preserved. There
are mild degenerative changes within the lower lumbar spine.
Scattered vascular calcifications are noted. There is mild spurring
of both greater trochanters.
IMPRESSION: No evidence of acute fracture or dislocation. If the patient has
persistent hip pain of or inability to bear weight, followup imaging
may be warranted as hip fractures can be radiographically occult in
the elderly.
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
## 2013-01-25 NOTE — Discharge Instructions (Signed)
Recommend rest, ice to your hip at least 4 times a day for the next 48-72 hours for 30 minutes each time, and ibuprofen or Tylenol as needed for discomfort. Follow up with an orthopedic specialist if symptoms do not improve recommended treatments. Return to the emergency department if symptoms worsen.  Arthralgia Your caregiver has diagnosed you as suffering from an arthralgia. Arthralgia means there is pain in a joint. This can come from many reasons including:  Bruising the joint which causes soreness (inflammation) in the joint.  Wear and tear on the joints which occur as we grow older (osteoarthritis).  Overusing the joint.  Various forms of arthritis.  Infections of the joint. Regardless of the cause of pain in your joint, most of these different pains respond to anti-inflammatory drugs and rest. The exception to this is when a joint is infected, and these cases are treated with antibiotics, if it is a bacterial infection. HOME CARE INSTRUCTIONS   Rest the injured area for as long as directed by your caregiver. Then slowly start using the joint as directed by your caregiver and as the pain allows. Crutches as directed may be useful if the ankles, knees or hips are involved. If the knee was splinted or casted, continue use and care as directed. If an stretchy or elastic wrapping bandage has been applied today, it should be removed and re-applied every 3 to 4 hours. It should not be applied tightly, but firmly enough to keep swelling down. Watch toes and feet for swelling, bluish discoloration, coldness, numbness or excessive pain. If any of these problems (symptoms) occur, remove the ace bandage and re-apply more loosely. If these symptoms persist, contact your caregiver or return to this location.  For the first 24 hours, keep the injured extremity elevated on pillows while lying down.  Apply ice for 15-20 minutes to the sore joint every couple hours while awake for the first half day. Then  3-4 times per day for the first 48 hours. Put the ice in a plastic bag and place a towel between the bag of ice and your skin.  Wear any splinting, casting, elastic bandage applications, or slings as instructed.  Only take over-the-counter or prescription medicines for pain, discomfort, or fever as directed by your caregiver. Do not use aspirin immediately after the injury unless instructed by your physician. Aspirin can cause increased bleeding and bruising of the tissues.  If you were given crutches, continue to use them as instructed and do not resume weight bearing on the sore joint until instructed. Persistent pain and inability to use the sore joint as directed for more than 2 to 3 days are warning signs indicating that you should see a caregiver for a follow-up visit as soon as possible. Initially, a hairline fracture (break in bone) may not be evident on X-rays. Persistent pain and swelling indicate that further evaluation, non-weight bearing or use of the joint (use of crutches or slings as instructed), or further X-rays are indicated. X-rays may sometimes not show a small fracture until a week or 10 days later. Make a follow-up appointment with your own caregiver or one to whom we have referred you. A radiologist (specialist in reading X-rays) may read your X-rays. Make sure you know how you are to obtain your X-ray results. Do not assume everything is normal if you do not hear from [place]. SEEK MEDICAL CARE IF: Bruising, swelling, or pain increases. SEEK IMMEDIATE MEDICAL CARE IF:   Your fingers or toes are  numb or blue.  The pain is not responding to medications and continues to stay the same or get worse.  The pain in your joint becomes severe.  You develop a fever over 102 F (38.9 C).  It becomes impossible to move or use the joint. MAKE SURE YOU:   Understand these instructions.  Will watch your condition.  Will get help right away if you are not doing well or get  worse. Document Released: 05/21/2005 Document Revised: 08/13/2011 Document Reviewed: 01/07/2008 [facility] Patient Information 2014 [place], [place]. RICE: Routine Care for Injuries Rest, Ice, Compression, and Elevation (RICE) are often used to care for injuries. HOME CARE  Rest your injury.  Put ice on the injury.  Put ice in a plastic bag.  Place a towel between your skin and the bag.  Leave the ice on for 15-20 minutes, 3-4 times a day. Do this for as long as told by your doctor.  Apply pressure (compression) with an elastic bandage. Remove and reapply the bandage every 3 to 4 hours. Do not wrap the bandage too tight. Wrap the bandage looser if the fingers or toes are puffy (swollen), blue, cold, painful, or lose feeling (numb).  Raise (elevate) your injury. Raise your injury above the heart if you can. GET HELP RIGHT AWAY IF:  You have lasting pain or puffiness.  Your injury is red, weak, or loses feeling.  Your problems get worse, not better, after several days. MAKE SURE YOU:  Understand these instructions.  Will watch your condition.  Will get help right away if you are not doing well or get worse. Document Released: 11/07/2007 Document Revised: 08/13/2011 Document Reviewed: 10/20/2010 [facility] Patient Information 2014 [place], [place].

## 2013-01-25 NOTE — ED Notes (Signed)
Pt returned from xray
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
## 2013-01-26 NOTE — ED Provider Notes (Signed)
Medical screening examination/treatment/procedure(s) were performed by non-physician practitioner and as supervising physician I was immediately available for consultation/collaboration.   Shanna Cisco, MD 01/26/13 [phone]

## 2019-07-13 ENCOUNTER — Inpatient Hospital Stay (HOSPITAL_COMMUNITY)
Admission: EM | Admit: 2019-07-13 | Discharge: 2019-07-18 | DRG: 439 | Disposition: A | Discharge status: Home / Self Care | Payer: Medicare Other | Attending: Family Medicine | Admitting: Family Medicine

## 2019-07-13 DIAGNOSIS — K8500 Idiopathic acute pancreatitis without necrosis or infection: Secondary | ICD-10-CM

## 2019-07-13 DIAGNOSIS — Z7289 Other problems related to lifestyle: Secondary | ICD-10-CM

## 2019-07-13 DIAGNOSIS — R64 Cachexia: Secondary | ICD-10-CM | POA: Diagnosis present

## 2019-07-13 DIAGNOSIS — R030 Elevated blood-pressure reading, without diagnosis of hypertension: Secondary | ICD-10-CM | POA: Diagnosis present

## 2019-07-13 DIAGNOSIS — J9600 Acute respiratory failure, unspecified whether with hypoxia or hypercapnia: Secondary | ICD-10-CM

## 2019-07-13 DIAGNOSIS — K859 Acute pancreatitis without necrosis or infection, unspecified: Secondary | ICD-10-CM | POA: Diagnosis present

## 2019-07-13 DIAGNOSIS — K852 Alcohol induced acute pancreatitis without necrosis or infection: Secondary | ICD-10-CM | POA: Diagnosis not present

## 2019-07-13 DIAGNOSIS — D696 Thrombocytopenia, unspecified: Secondary | ICD-10-CM | POA: Diagnosis present

## 2019-07-13 DIAGNOSIS — D6959 Other secondary thrombocytopenia: Secondary | ICD-10-CM | POA: Diagnosis present

## 2019-07-13 DIAGNOSIS — Z87891 Personal history of nicotine dependence: Secondary | ICD-10-CM

## 2019-07-13 DIAGNOSIS — R059 Cough, unspecified: Secondary | ICD-10-CM

## 2019-07-13 DIAGNOSIS — Z789 Other specified health status: Secondary | ICD-10-CM

## 2019-07-13 DIAGNOSIS — K838 Other specified diseases of biliary tract: Secondary | ICD-10-CM | POA: Diagnosis present

## 2019-07-13 DIAGNOSIS — I10 Essential (primary) hypertension: Secondary | ICD-10-CM | POA: Diagnosis present

## 2019-07-13 DIAGNOSIS — J432 Centrilobular emphysema: Secondary | ICD-10-CM | POA: Diagnosis present

## 2019-07-13 DIAGNOSIS — Z79899 Other long term (current) drug therapy: Secondary | ICD-10-CM

## 2019-07-13 DIAGNOSIS — Z20822 Contact with and (suspected) exposure to covid-19: Secondary | ICD-10-CM | POA: Diagnosis present

## 2019-07-13 DIAGNOSIS — K029 Dental caries, unspecified: Secondary | ICD-10-CM | POA: Diagnosis present

## 2019-07-13 DIAGNOSIS — K760 Fatty (change of) liver, not elsewhere classified: Secondary | ICD-10-CM | POA: Diagnosis present

## 2019-07-13 DIAGNOSIS — D45 Polycythemia vera: Secondary | ICD-10-CM | POA: Diagnosis present

## 2019-07-13 DIAGNOSIS — F1010 Alcohol abuse, uncomplicated: Secondary | ICD-10-CM | POA: Diagnosis present

## 2019-07-13 DIAGNOSIS — R05 Cough: Secondary | ICD-10-CM

## 2019-07-13 MED ORDER — SODIUM CHLORIDE 0.9% FLUSH
3.0000 mL | Freq: Once | INTRAVENOUS | Status: AC
  Administered 2019-07-14: 3 mL via INTRAVENOUS

## 2019-07-13 NOTE — ED Triage Notes (Signed)
BIB EMS from home. Reports abd pain, N/V since this AM. Abd pain improved after vomiting 1 time en route. VSS.

## 2019-07-14 ENCOUNTER — Other Ambulatory Visit: Payer: Self-pay

## 2019-07-14 ENCOUNTER — Encounter (HOSPITAL_COMMUNITY): Payer: Self-pay | Admitting: Family Medicine

## 2019-07-14 ENCOUNTER — Emergency Department (HOSPITAL_COMMUNITY): Payer: Medicare Other

## 2019-07-14 ENCOUNTER — Inpatient Hospital Stay (HOSPITAL_COMMUNITY): Payer: Medicare Other

## 2019-07-14 DIAGNOSIS — Z79899 Other long term (current) drug therapy: Secondary | ICD-10-CM | POA: Diagnosis not present

## 2019-07-14 DIAGNOSIS — K859 Acute pancreatitis without necrosis or infection, unspecified: Secondary | ICD-10-CM | POA: Diagnosis present

## 2019-07-14 DIAGNOSIS — I10 Essential (primary) hypertension: Secondary | ICD-10-CM | POA: Diagnosis present

## 2019-07-14 DIAGNOSIS — R64 Cachexia: Secondary | ICD-10-CM | POA: Diagnosis present

## 2019-07-14 DIAGNOSIS — K852 Alcohol induced acute pancreatitis without necrosis or infection: Secondary | ICD-10-CM | POA: Diagnosis present

## 2019-07-14 DIAGNOSIS — R030 Elevated blood-pressure reading, without diagnosis of hypertension: Secondary | ICD-10-CM

## 2019-07-14 DIAGNOSIS — J432 Centrilobular emphysema: Secondary | ICD-10-CM | POA: Diagnosis present

## 2019-07-14 DIAGNOSIS — Z7289 Other problems related to lifestyle: Secondary | ICD-10-CM | POA: Diagnosis not present

## 2019-07-14 DIAGNOSIS — D6959 Other secondary thrombocytopenia: Secondary | ICD-10-CM | POA: Diagnosis present

## 2019-07-14 DIAGNOSIS — J42 Unspecified chronic bronchitis: Secondary | ICD-10-CM | POA: Diagnosis not present

## 2019-07-14 DIAGNOSIS — R918 Other nonspecific abnormal finding of lung field: Secondary | ICD-10-CM | POA: Diagnosis not present

## 2019-07-14 DIAGNOSIS — D45 Polycythemia vera: Secondary | ICD-10-CM | POA: Diagnosis present

## 2019-07-14 DIAGNOSIS — D696 Thrombocytopenia, unspecified: Secondary | ICD-10-CM

## 2019-07-14 DIAGNOSIS — K029 Dental caries, unspecified: Secondary | ICD-10-CM | POA: Diagnosis present

## 2019-07-14 DIAGNOSIS — K760 Fatty (change of) liver, not elsewhere classified: Secondary | ICD-10-CM | POA: Diagnosis present

## 2019-07-14 DIAGNOSIS — Z87891 Personal history of nicotine dependence: Secondary | ICD-10-CM | POA: Diagnosis not present

## 2019-07-14 DIAGNOSIS — R0902 Hypoxemia: Secondary | ICD-10-CM | POA: Diagnosis not present

## 2019-07-14 DIAGNOSIS — J9811 Atelectasis: Secondary | ICD-10-CM

## 2019-07-14 DIAGNOSIS — J189 Pneumonia, unspecified organism: Secondary | ICD-10-CM | POA: Diagnosis not present

## 2019-07-14 DIAGNOSIS — J690 Pneumonitis due to inhalation of food and vomit: Secondary | ICD-10-CM | POA: Diagnosis not present

## 2019-07-14 DIAGNOSIS — Z20822 Contact with and (suspected) exposure to covid-19: Secondary | ICD-10-CM | POA: Diagnosis present

## 2019-07-14 DIAGNOSIS — K838 Other specified diseases of biliary tract: Secondary | ICD-10-CM

## 2019-07-14 DIAGNOSIS — F1010 Alcohol abuse, uncomplicated: Secondary | ICD-10-CM | POA: Diagnosis present

## 2019-07-14 DIAGNOSIS — Z789 Other specified health status: Secondary | ICD-10-CM

## 2019-07-14 LAB — COMPREHENSIVE METABOLIC PANEL
ALT: 65 U/L — ABNORMAL HIGH (ref 0–44)
AST: 168 U/L — ABNORMAL HIGH (ref 15–41)
Albumin: 3.2 g/dL — ABNORMAL LOW (ref 3.5–5.0)
Alkaline Phosphatase: 88 U/L (ref 38–126)
Anion gap: 15 (ref 5–15)
BUN: 7 mg/dL — ABNORMAL LOW (ref 8–23)
CO2: 20 mmol/L — ABNORMAL LOW (ref 22–32)
Calcium: 8.7 mg/dL — ABNORMAL LOW (ref 8.9–10.3)
Chloride: 98 mmol/L (ref 98–111)
Creatinine, Ser: 0.58 mg/dL (ref 0.44–1.00)
GFR calc Af Amer: >60 mL/min (ref >60)
GFR calc non Af Amer: >60 mL/min (ref >60)
Glucose, Bld: 116 mg/dL — ABNORMAL HIGH (ref 70–99)
Potassium: 4.5 mmol/L (ref 3.5–5.1)
Sodium: 133 mmol/L — ABNORMAL LOW (ref 135–145)
Total Bilirubin: 1.1 mg/dL (ref 0.3–1.2)
Total Protein: 7.5 g/dL (ref 6.5–8.1)

## 2019-07-14 LAB — CREATININE, SERUM
Creatinine, Ser: 0.74 mg/dL (ref 0.44–1.00)
GFR calc Af Amer: >60 mL/min (ref >60)
GFR calc non Af Amer: >60 mL/min (ref >60)

## 2019-07-14 LAB — CBC
HCT: 51.1 % — ABNORMAL HIGH (ref 36.0–46.0)
HCT: 45.5 % (ref 36.0–46.0)
Hemoglobin: 16.5 g/dL — ABNORMAL HIGH (ref 12.0–15.0)
Hemoglobin: 15.1 g/dL — ABNORMAL HIGH (ref 12.0–15.0)
MCH: 31.7 pg (ref 26.0–34.0)
MCH: 32 pg (ref 26.0–34.0)
MCHC: 32.3 g/dL (ref 30.0–36.0)
MCHC: 33.2 g/dL (ref 30.0–36.0)
MCV: 98.3 fL (ref 80.0–100.0)
MCV: 96.4 fL (ref 80.0–100.0)
Platelets: 101 10*3/uL — ABNORMAL LOW (ref 150–400)
Platelets: 124 10*3/uL — ABNORMAL LOW (ref 150–400)
RBC: 5.2 MIL/uL — ABNORMAL HIGH (ref 3.87–5.11)
RBC: 4.72 MIL/uL (ref 3.87–5.11)
RDW: 13.4 % (ref 11.5–15.5)
RDW: 13.3 % (ref 11.5–15.5)
WBC: 5.8 10*3/uL (ref 4.0–10.5)
WBC: 7.2 10*3/uL (ref 4.0–10.5)
nRBC: 0 % (ref 0.0–0.2)
nRBC: 0 % (ref 0.0–0.2)

## 2019-07-14 LAB — MAGNESIUM: Magnesium: 1.7 mg/dL (ref 1.7–2.4)

## 2019-07-14 LAB — HIV ANTIBODY (ROUTINE TESTING W REFLEX): HIV Screen 4th Generation wRfx: NONREACTIVE

## 2019-07-14 LAB — SARS CORONAVIRUS 2 (TAT 6-24 HRS): SARS Coronavirus 2: NEGATIVE

## 2019-07-14 LAB — LIPID PANEL
Cholesterol: 212 mg/dL — ABNORMAL HIGH (ref 0–200)
HDL: 77 mg/dL (ref >40)
LDL Cholesterol: 128 mg/dL — ABNORMAL HIGH (ref 0–99)
Total CHOL/HDL Ratio: 2.8 RATIO
Triglycerides: 37 mg/dL (ref <150)
VLDL: 7 mg/dL (ref 0–40)

## 2019-07-14 LAB — HEMOGLOBIN A1C
Hgb A1c MFr Bld: 4.9 % (ref 4.8–5.6)
Mean Plasma Glucose: 94 mg/dL

## 2019-07-14 LAB — LIPASE, BLOOD: Lipase: 979 U/L — ABNORMAL HIGH (ref 11–51)

## 2019-07-14 LAB — PHOSPHORUS: Phosphorus: 4.6 mg/dL (ref 2.5–4.6)

## 2019-07-14 IMAGING — CT CT CHEST W/ CM
2 of 3 series · 14 of 36 positions shown, 17 images · IV contrast (Omni 300)
Comparison: Abdominal CT [DATE].  Chest CT [DATE]

CLINICAL DATA: 68-year-old with abdominal pain. Airspace disease
and consolidation in right lung on recent abdominal CT.

EXAM:
CT CHEST WITH CONTRAST
TECHNIQUE: Multidetector CT imaging of the chest was performed during
intravenous contrast administration.
CONTRAST:  100mL OMNIPAQUE IOHEXOL 300 MG/ML  SOLN

[Series 2: chest with 2mm st · axial · 0.82mm/px · z∈[+952,+1192]mm · 11 of 142 slices shown, 14 images]
[im 11/142  mediastinal]
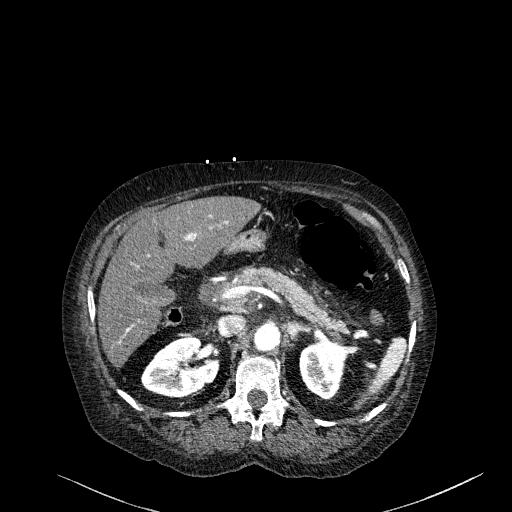
[im 11/142  lung]
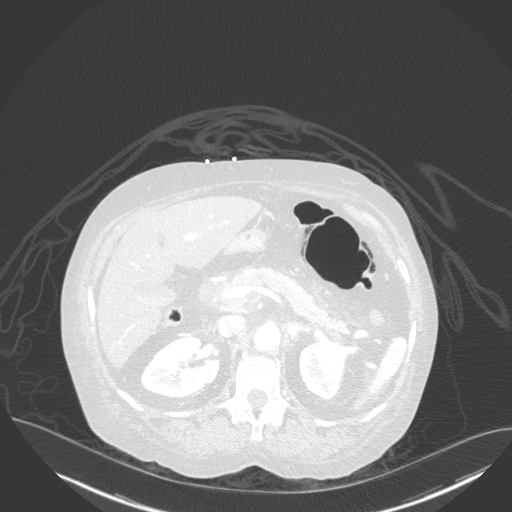
[im 21/142  lung]
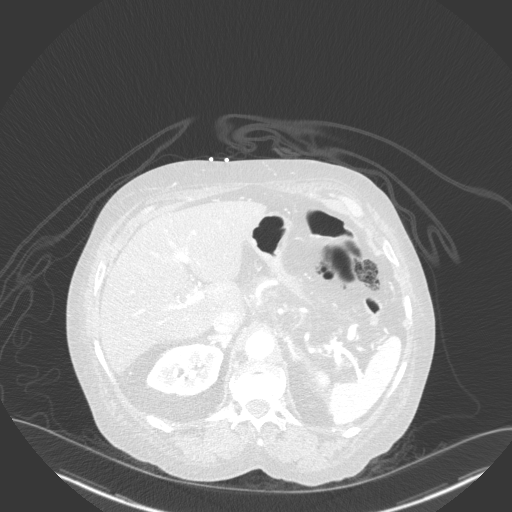
[im 32/142  lung]
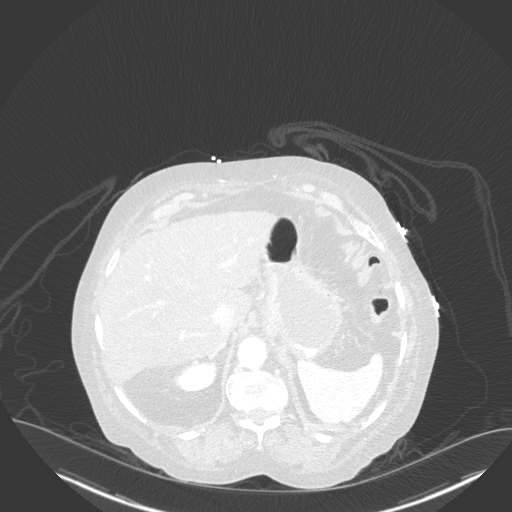
[im 48/142  lung]
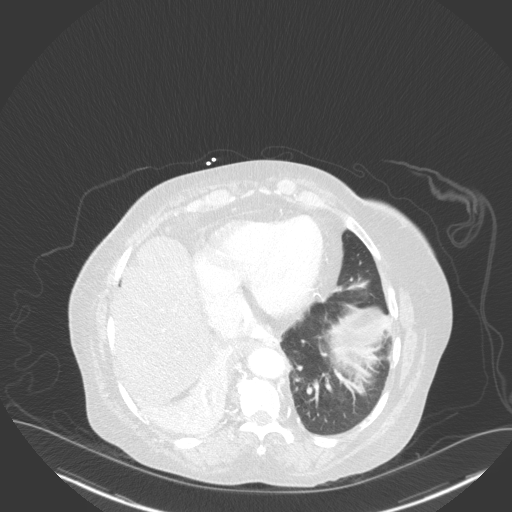
[im 58/142  mediastinal]
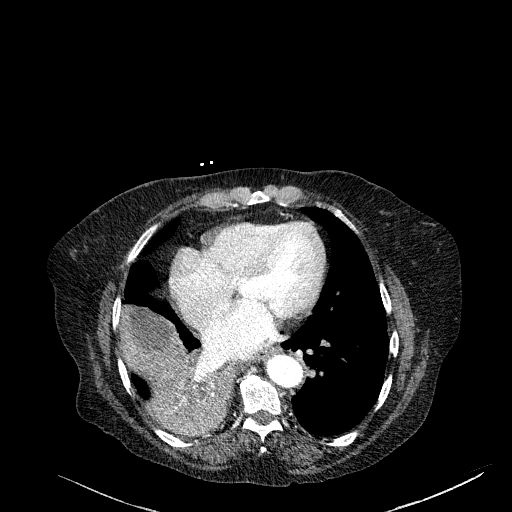
[im 58/142  lung]
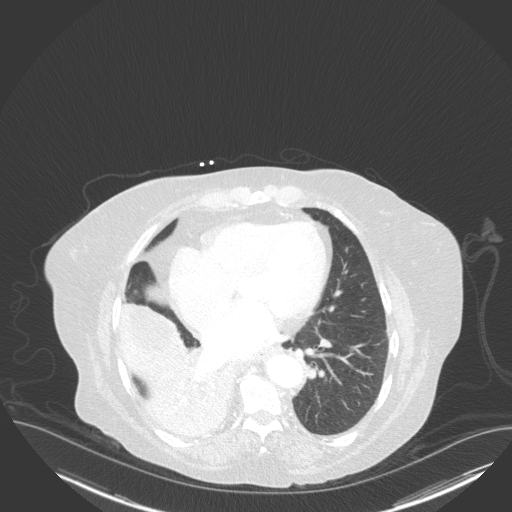
[im 74/142  lung]
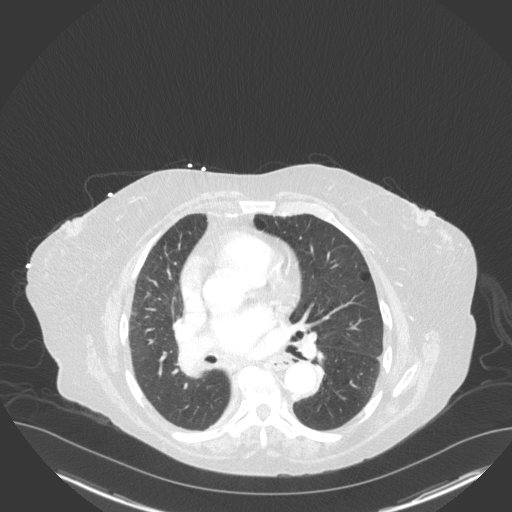
[im 84/142  lung]
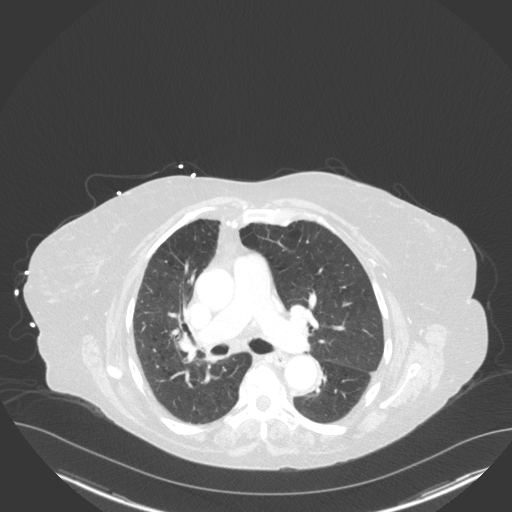
[im 95/142  lung]
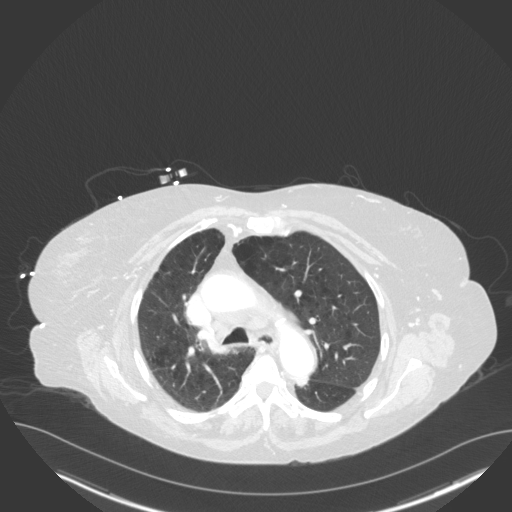
[im 110/142  mediastinal]
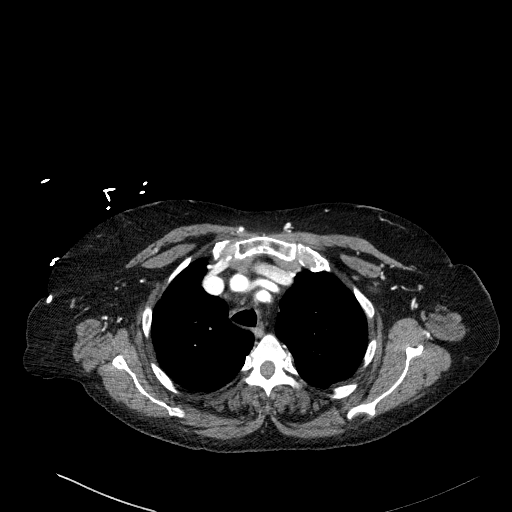
[im 110/142  lung]
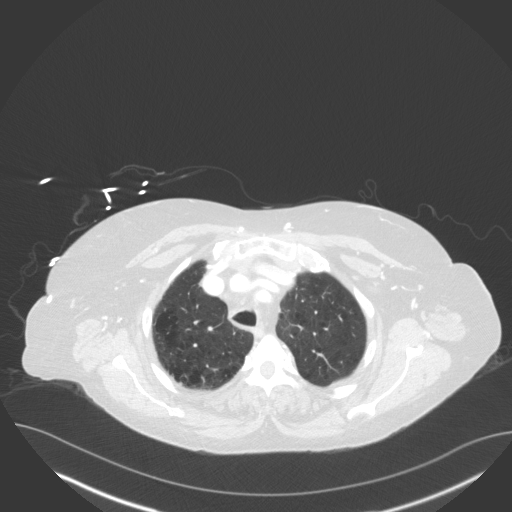
[im 121/142  lung]
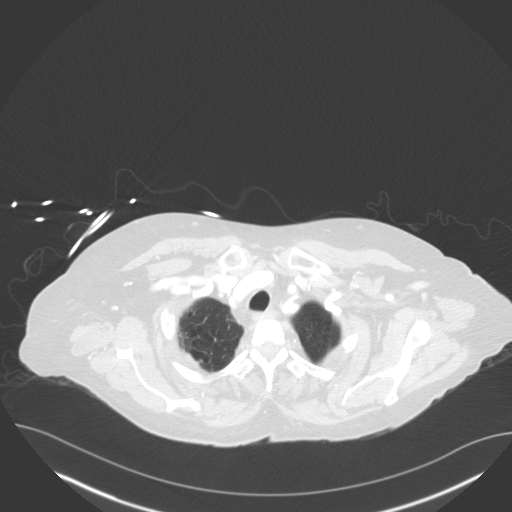
[im 131/142  lung]
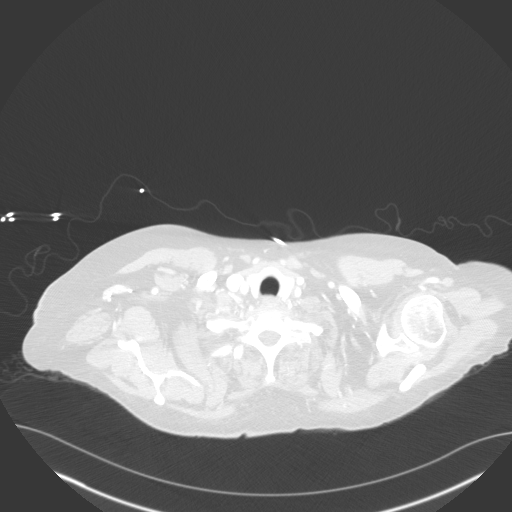

[Series 5: chest with 3mm st cor · coronal · 0.59mm/px · 3 of 101 slices shown]
[im 21/101  lung]
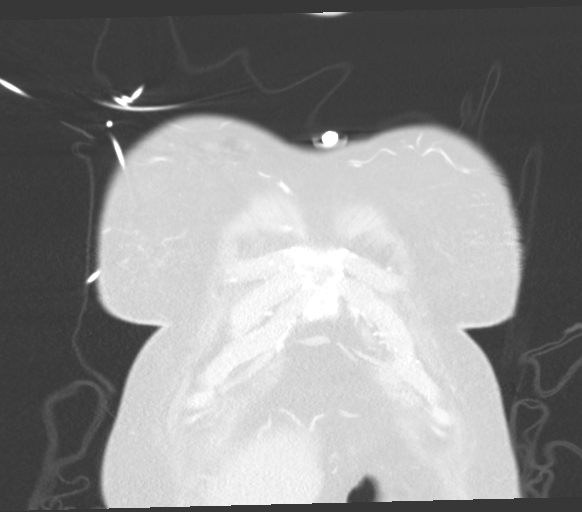
[im 41/101  lung]
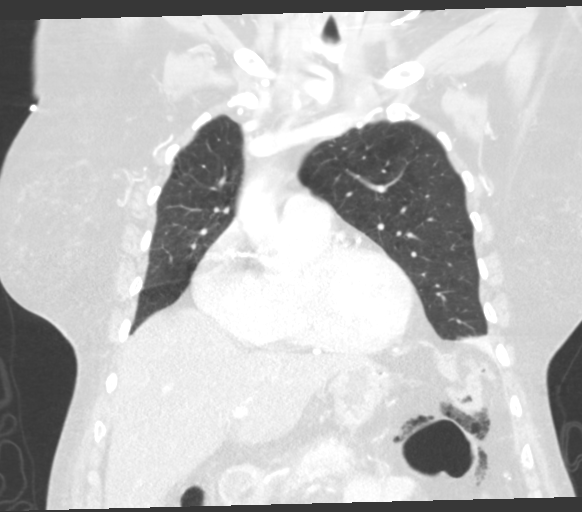
[im 61/101  lung]
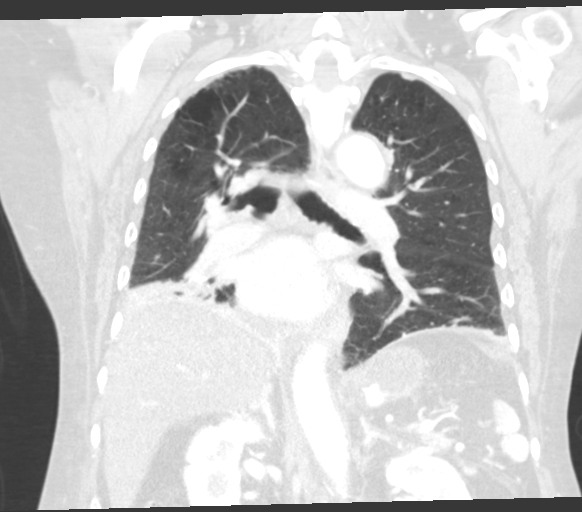

[14 of 36 positions shown; findings below may reference images not displayed]

FINDINGS: Cardiovascular: Coronary artery calcifications. Normal caliber of
the thoracic aorta. No evidence for an aortic dissection. Great
vessels patent. Celiac trunk, SMA and right renal artery are patent.
Atherosclerotic disease in the proximal abdominal aorta and at the
aortic arch.

Mediastinum/Nodes: Borderline enlarged mediastinal lymph nodes.
Lymph node in the pretracheal region measures 1.1 cm in short axis
on sequence 2, image 52. No definite hilar lymphadenopathy. No
axillary or supraclavicular lymphadenopathy. The visualized thyroid
tissue is unremarkable. No gross abnormality to the esophagus.

Lungs/Pleura: Trachea and mainstem bronchi are patent. Volume loss
in the right hemithorax with mediastinal shift towards the right.
There is collapse or obstruction in the right lower lobe bronchus
just beyond the origin. Right middle lobe bronchus is patent but
there is distal consolidation or volume loss in right middle lobe.
Underlying centrilobular emphysema most conspicuous in the right
upper lobe. Left lung is clear. Minimal volume loss at the left lung
base.

Upper Abdomen: Diffusely low attenuation of the liver is compatible
with steatosis. Again noted are inflammatory changes and edema
centered around the pancreas. Common bile duct is slightly prominent
measuring 1.0 cm. No significant intrahepatic biliary dilatation.
Normal appearance of the stomach. There is extraluminal gas in the
left upper abdomen. In retrospect, there was irregular gas in this
region on the recent abdominal CT. The extraluminal gas appears to
be confined within the mesocolon surrounding the sigmoid colon and
splenic flexure. Gas does not clearly represent pneumatosis. Sigmoid
colon extends up to the left upper abdomen and sigmoid colon is
dilated with gas without wall thickening. No evidence for free
intraperitoneal air on the right side of the abdomen.

Musculoskeletal: Old right posterior eleventh rib fracture.
IMPRESSION: 1. Extraluminal gas in left upper abdomen centered around the
sigmoid colon and splenic flexure area. Sigmoid colon is distended
with gas in this area. This extraluminal gas appears to be confined
to the pericolonic tissue or mesocolon. Findings could be related to
a bowel perforation but indeterminate based on the recent abdominal
CT.
2. Complete collapse of the right lower lobe. Etiology for the
collapse is unknown. An obstructing endobronchial lesion cannot be
excluded. Consider further characterization with bronchoscopy. Small
amount of volume loss in the right middle lobe.
3. Edema and inflammatory changes in the upper abdomen centered
around the pancreas. Findings are suggestive for pancreatitis.
Distal common bile duct is prominent measuring up to 1.0 cm and
cannot exclude a distal biliary obstruction.
4. Hepatic steatosis.
5. Coronary artery calcifications.

These results were called by telephone at the time of interpretation
on [DATE] at [DATE] to provider MOEN , who verbally
acknowledged these results.

## 2019-07-14 IMAGING — DX DG CHEST 2V
2 series · 2 of 2 positions shown · non-contrast
Comparison: [DATE]
COMPARISON: [DATE]

Addendum:
CLINICAL DATA: Abdominal pain, cough history of hypertension

EXAM:
CHEST - 2 VIEW

[x chest ap]
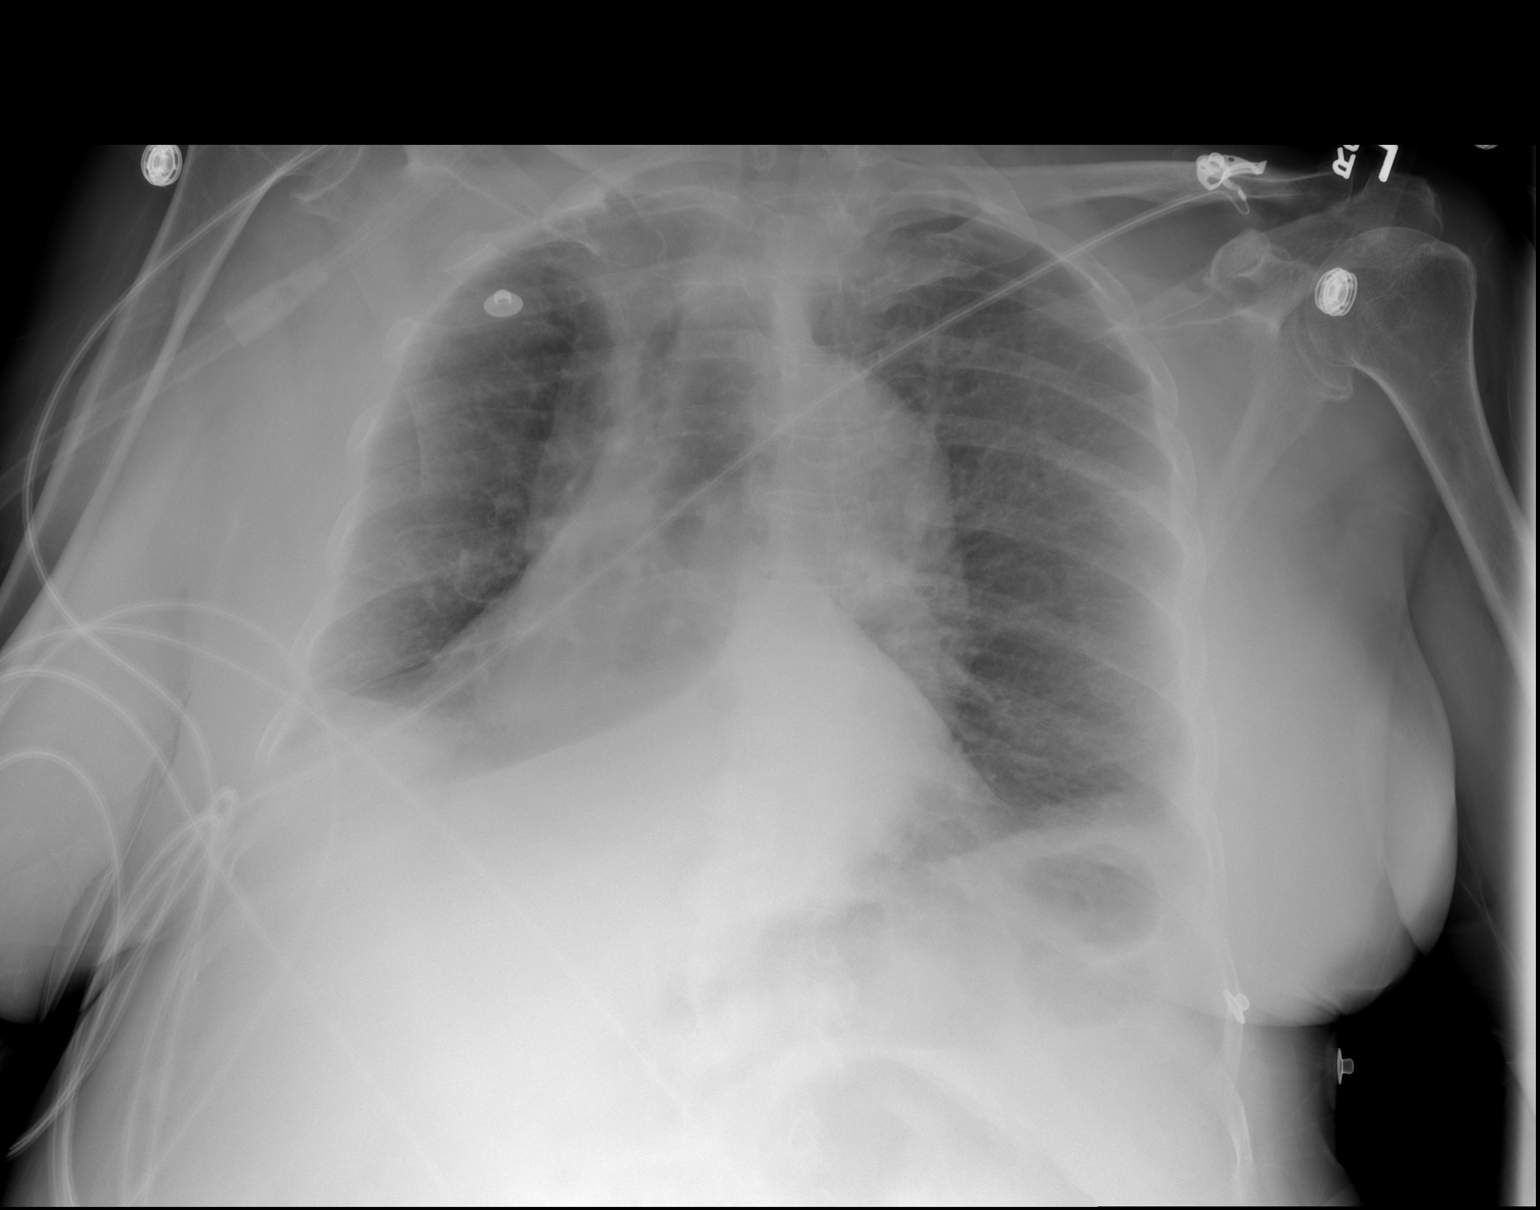

[w chest lat]
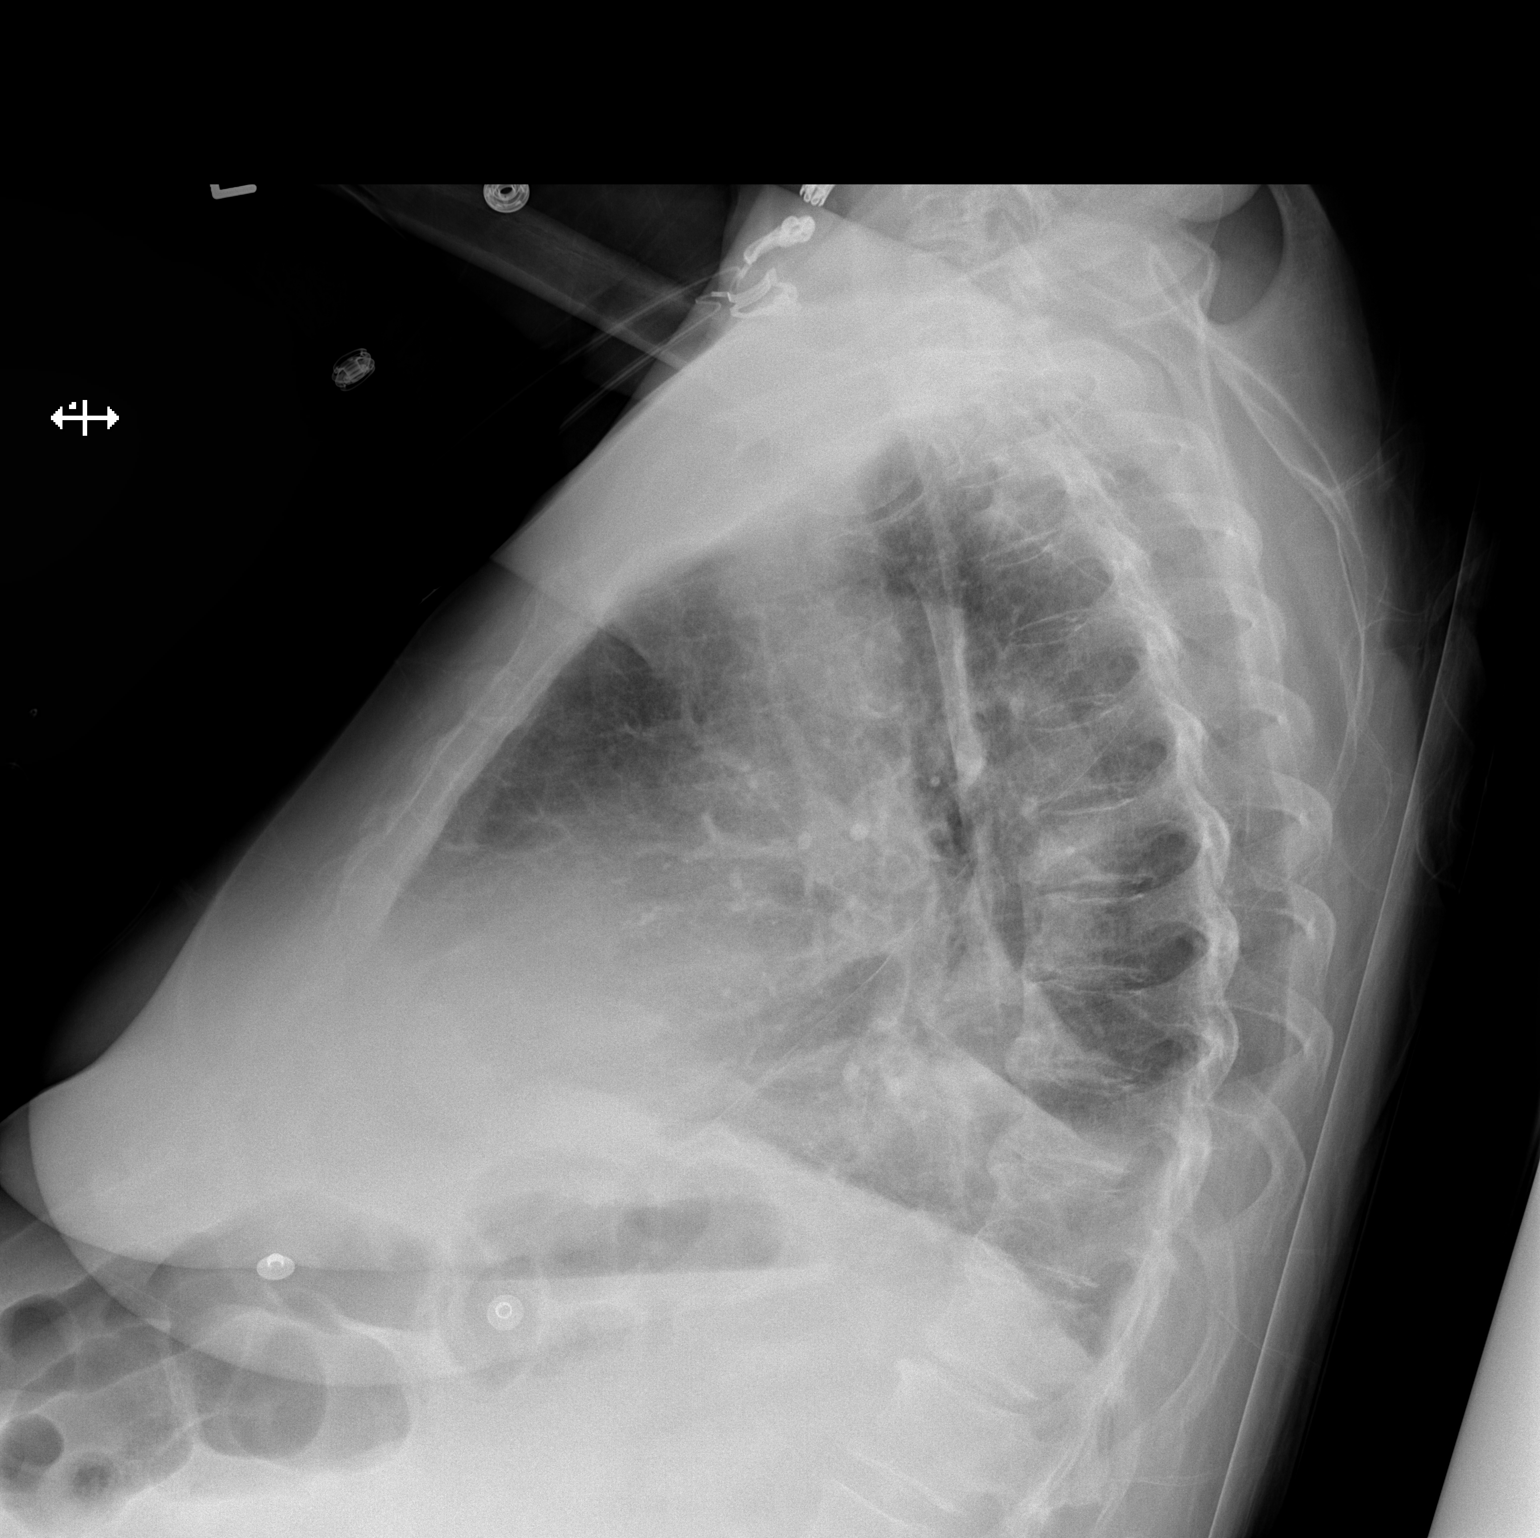

[2 of 2 positions shown; findings below may reference images not displayed]

FINDINGS: Film is rotated markedly to the right, accounting for this
cardiomediastinal contours are stable.

Dense basilar consolidation suggested at right lung base
silhouetting portion of medial right hemidiaphragm on this
projection.

No signs of additional consolidative change though study limited by
rotation.

No acute bone process.
IMPRESSION: Dense basilar consolidation suggested at the right lung base
silhouetting the medial right hemidiaphragm. Based on appearance
findings could be due to central obstructing pulmonary lesion and/or
pneumonia. Suggest chest CT for further evaluation.

A call is out to the referring provider to further discuss findings
in the above case.

ADDENDUM:
These results were called by telephone at the time of interpretation
on [DATE] at [DATE] to provider CATHRIN , who verbally
acknowledged these results.

*** End of Addendum ***
FINDINGS: Film is rotated markedly to the right, accounting for this
cardiomediastinal contours are stable.

Dense basilar consolidation suggested at right lung base
silhouetting portion of medial right hemidiaphragm on this
projection.

No signs of additional consolidative change though study limited by
rotation.

No acute bone process.
IMPRESSION: Dense basilar consolidation suggested at the right lung base
silhouetting the medial right hemidiaphragm. Based on appearance
findings could be due to central obstructing pulmonary lesion and/or
pneumonia. Suggest chest CT for further evaluation.

A call is out to the referring provider to further discuss findings
in the above case.

## 2019-07-14 IMAGING — CT CT ABD-PELV W/ CM
2 of 5 series · 16 of 46 positions shown, 18 images · IV contrast (omnipaque)
Comparison: CT chest [DATE]

CLINICAL DATA: Lower abdominal pain nausea vomiting

EXAM:
CT ABDOMEN AND PELVIS WITH CONTRAST
TECHNIQUE: Multidetector CT imaging of the abdomen and pelvis was performed
using the standard protocol following bolus administration of
intravenous contrast.
CONTRAST:  100mL OMNIPAQUE IOHEXOL 300 MG/ML  SOLN

[Series 3: a/p w/ 5mm · axial · 0.82mm/px · z∈[+850,+1230]mm · 13 of 86 slices shown, 15 images]
[im 5/86  soft-tissue]
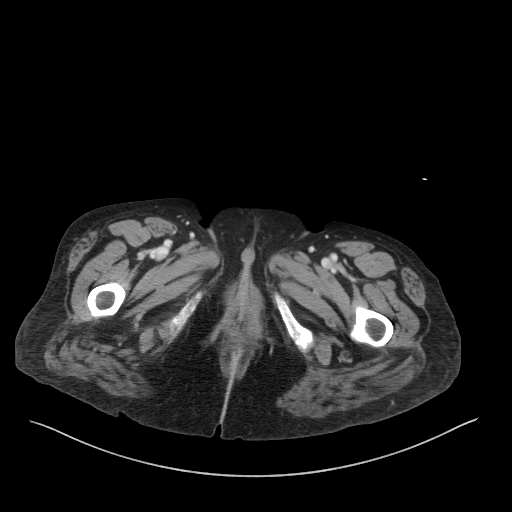
[im 5/86  bone]
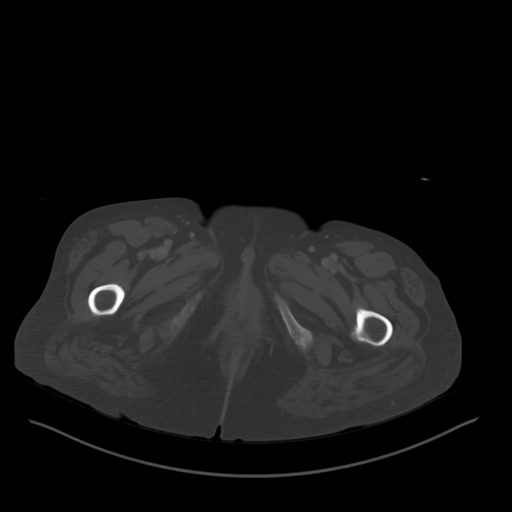
[im 13/86  soft-tissue]
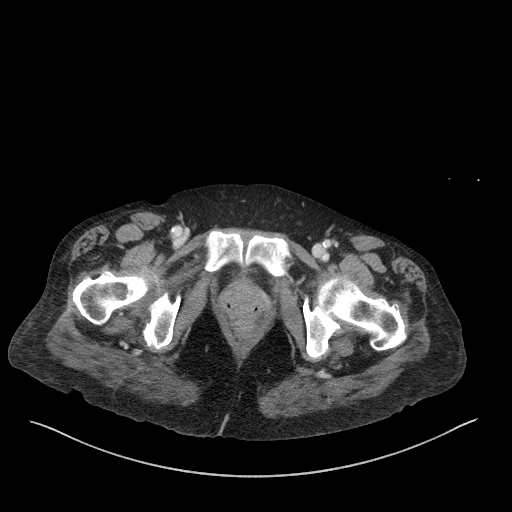
[im 18/86  soft-tissue]
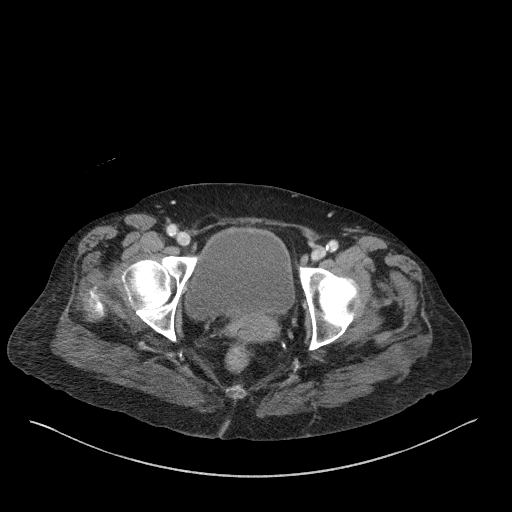
[im 26/86  soft-tissue]
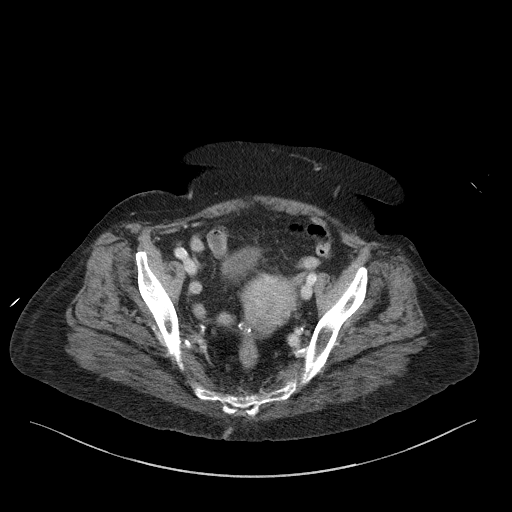
[im 30/86  soft-tissue]
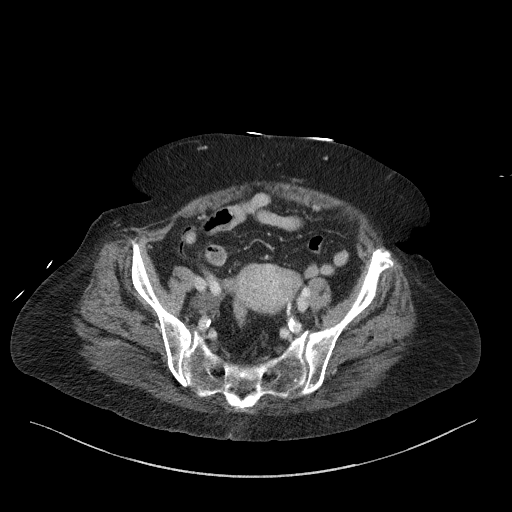
[im 39/86  soft-tissue]
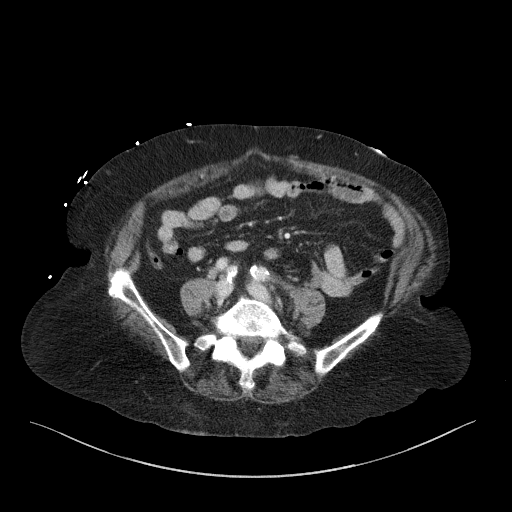
[im 43/86  soft-tissue]
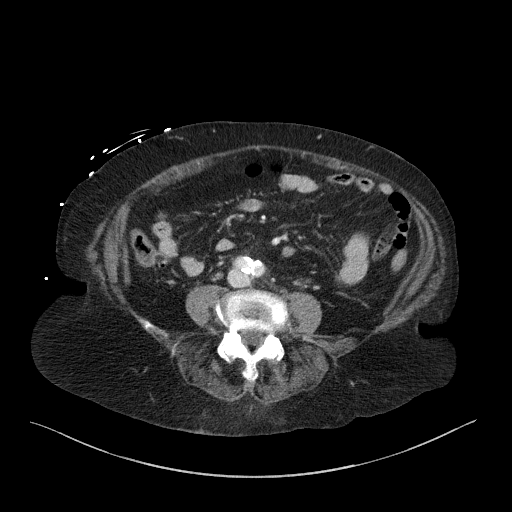
[im 47/86  soft-tissue]
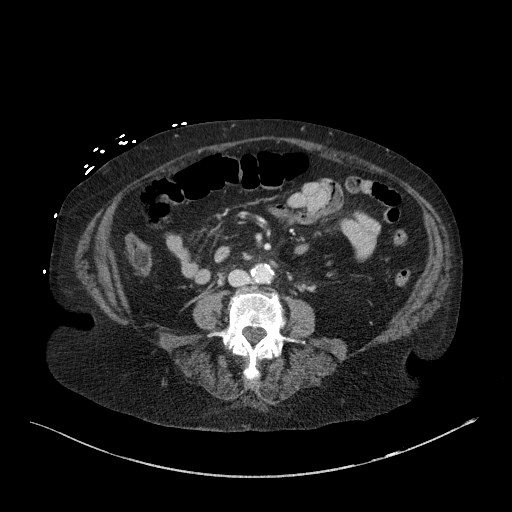
[im 56/86  soft-tissue]
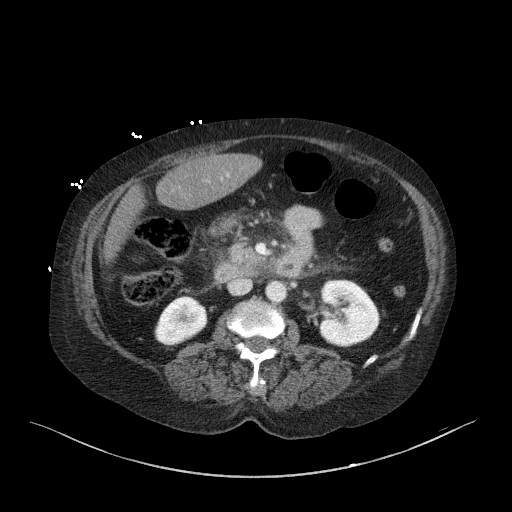
[im 56/86  bone]
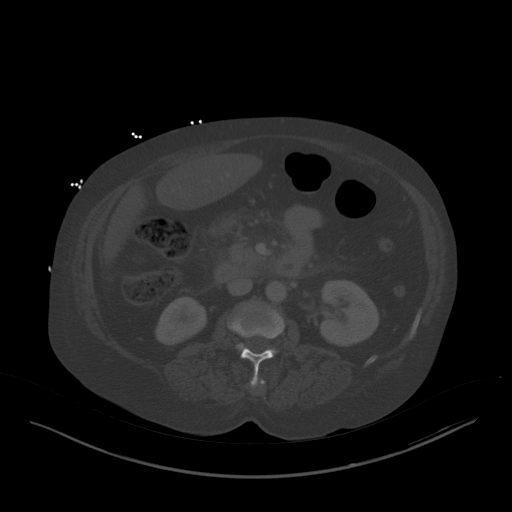
[im 60/86  soft-tissue]
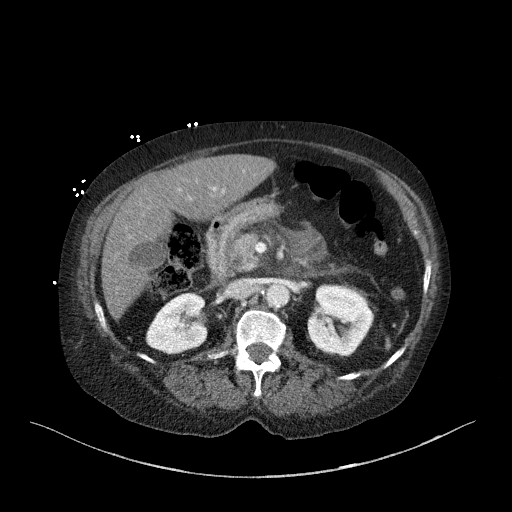
[im 69/86  soft-tissue]
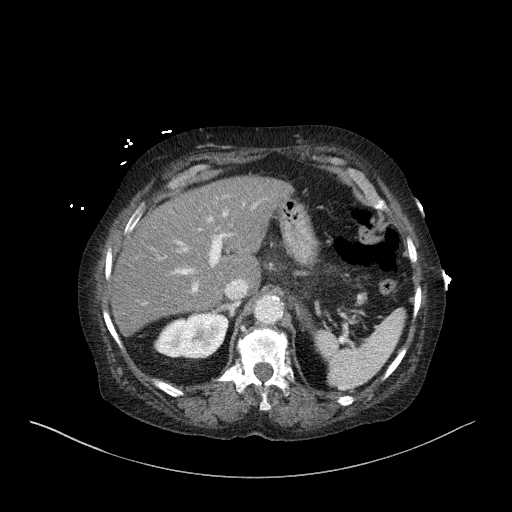
[im 73/86  soft-tissue]
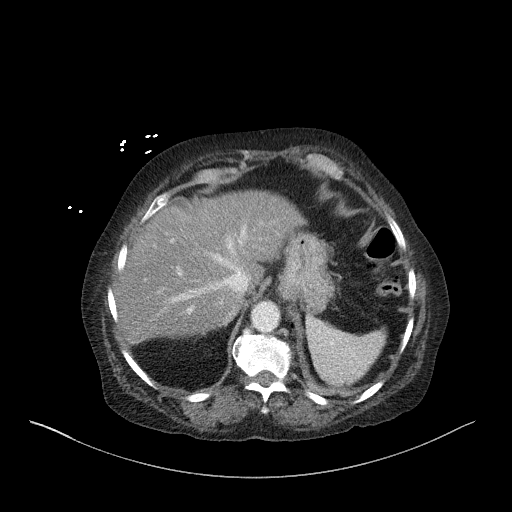
[im 81/86  soft-tissue]
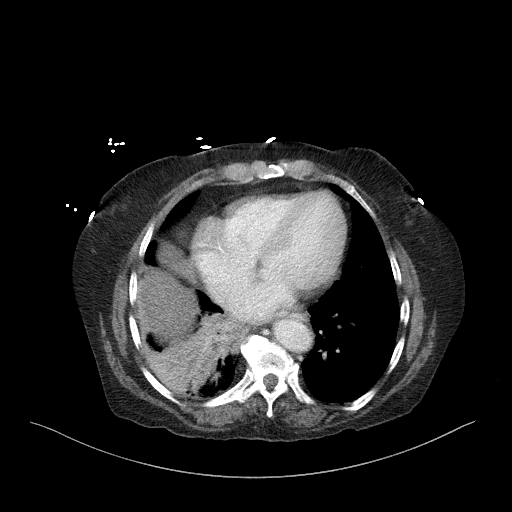

[Series 6: a/p w/ cor · coronal · 0.76mm/px · 3 of 151 slices shown]
[im 51/151  soft-tissue]
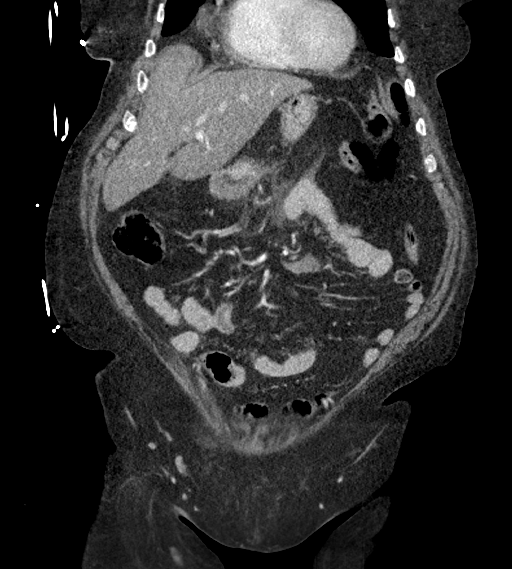
[im 67/151  soft-tissue]
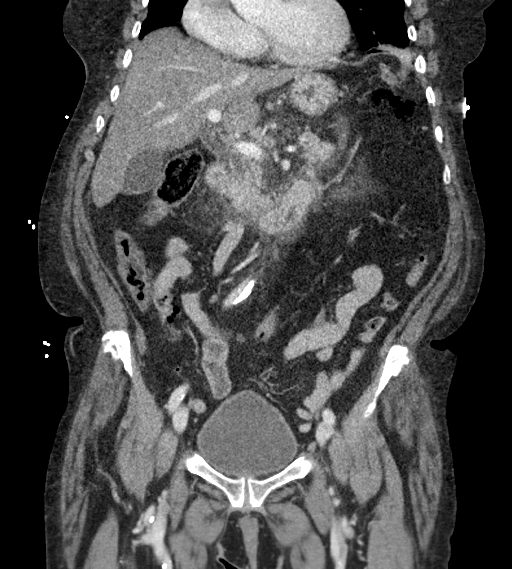
[im 84/151  soft-tissue]
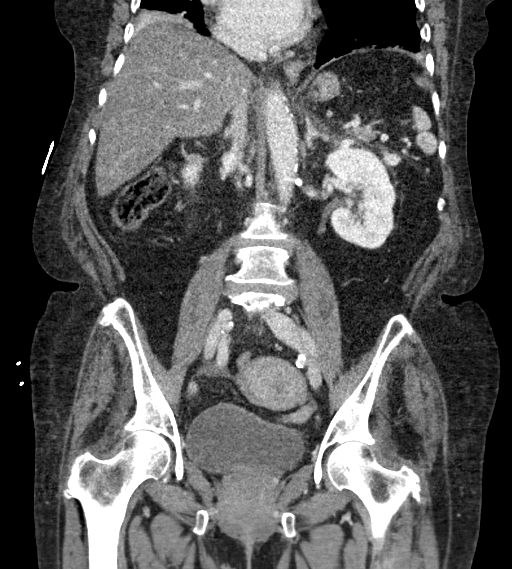

[16 of 46 positions shown; findings below may reference images not displayed]

FINDINGS: Lower chest: The visualized heart size within normal limits. No
pericardial fluid/thickening.

No hiatal hernia.

There is patchy airspace consolidation in the posterior right lung
base with small air bronchograms.

Hepatobiliary: There is diffuse low density seen throughout the
liver parenchyma.The main portal vein is patent. No evidence of
calcified gallstones, gallbladder wall thickening. There appears to
be mild dilation of the common bile duct at the level of the ampulla
measuring up to 1 cm. However no calcified stones are seen.

Pancreas: There is significant mesenteric fat stranding changes seen
surrounding the entirety of the pancreas, predominantly around the
proximal and mid body. No loculated fluid collections are seen. The
SMV is patent. Normal pancreatic enhancement seen throughout.

Spleen: Normal in size without focal abnormality.

Adrenals/Urinary Tract: Both adrenal glands appear normal. The
kidneys and collecting system appear normal without evidence of
urinary tract calculus or hydronephrosis. Bladder is unremarkable.

Stomach/Bowel: The stomach is normal in appearance. There appears to
be mild wall thickening seen surrounding the first and second
portion of the duodenum. The remainder of the small bowel and colon
are normal in size and contour.The appendix is normal.

Vascular/Lymphatic: There are no enlarged mesenteric,
retroperitoneal, or pelvic lymph nodes. Scattered aortic
atherosclerotic calcifications are seen without aneurysmal
dilatation.

Reproductive: The uterus and adnexa are unremarkable.

Other: No evidence of abdominal wall mass or hernia.

Musculoskeletal: No acute or significant osseous findings.
IMPRESSION: 1. Findings consistent with acute pancreatitis with diffuse
inflammatory changes, however predominantly around the proximal and
mid body. No evidence of pancreatic necrosis or pseudocyst.
2. Mildly dilated distal common bile duct at the level of the
ampulla, however no calcified stones are seen.
3. Hepatic steatosis
4. Inflammatory changes surrounding the first and second portion of
the duodenum.
5. Patchy airspace consolidation with air bronchogram seen at the
posterior right lung base which could be due to atelectasis and/or
infectious etiology.
6.  Aortic Atherosclerosis ([UG]-[UG]).

## 2019-07-14 IMAGING — MR MR ABDOMEN WO/W CM MRCP
21 of 24 series · 44 of 48 positions shown · IV contrast (gadavist)
Comparison: CT abdomen/pelvis dated [DATE]

CLINICAL DATA: Acute pancreatitis

EXAM:
MRI ABDOMEN WITHOUT AND WITH CONTRAST (INCLUDING MRCP)
TECHNIQUE: Multiplanar multisequence MR imaging of the abdomen was performed
both before and after the administration of intravenous contrast.
Heavily T2-weighted images of the biliary and pancreatic ducts were
obtained, and three-dimensional MRCP images were rendered by post
processing.
CONTRAST:  7mL GADAVIST GADOBUTROL 1 MMOL/ML IV SOLN

[Series 5: bSSFP · coronal · 6.0mm · 0.74mm/px · 2 of 30 slices shown]
[im 1/30]
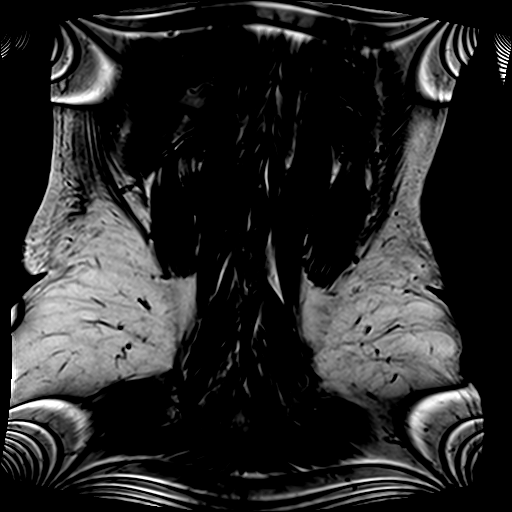
[im 30/30]
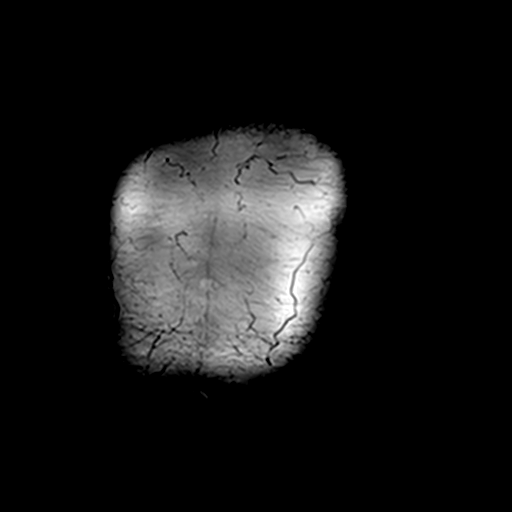

[Series 6: T2 fat-sat · axial · 6.0mm · 1.12mm/px · z∈[-160,+49]mm · 2 of 30 slices shown]
[im 1/30]
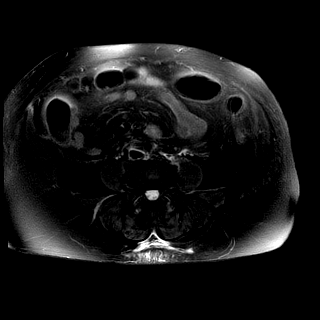
[im 30/30]
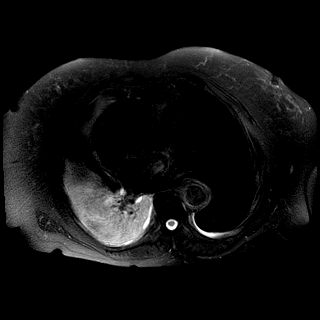

[Series 7: DWI · axial · 6.0mm · 1.34mm/px · z∈[-160,+49]mm · 4 of 90 slices shown (1 of 2)]
[im 1/90]
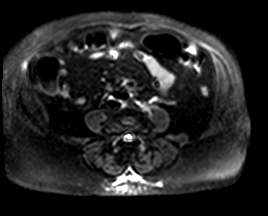
[im 30/90]
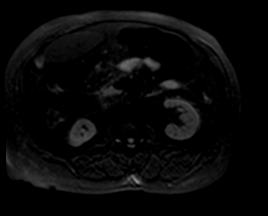
[im 60/90]
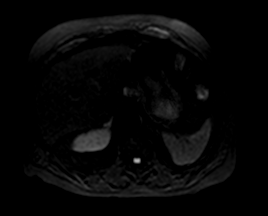
[im 90/90]
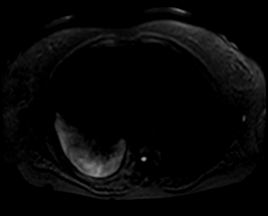

[Series 8: DWI · axial · 6.0mm · 1.34mm/px · z∈[-160,+49]mm · 2 of 30 slices shown (2 of 2)]
[im 1/30]
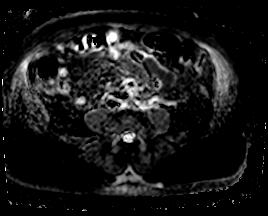
[im 30/30]
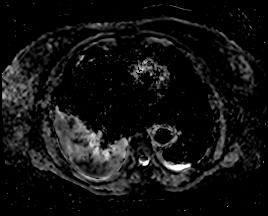

[Series 9: ax in and · axial · 3.0mm · 1.12mm/px · z∈[-157,+56]mm · 3 of 72 slices shown (1 of 4)]
[im 1/72]
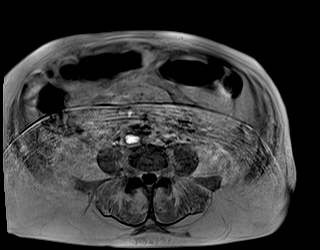
[im 36/72]
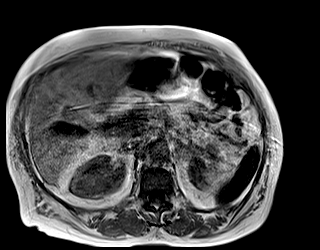
[im 72/72]
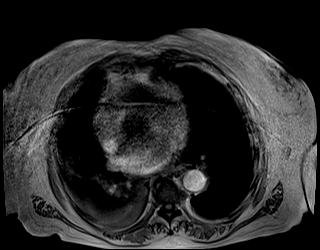

[Series 9: ax in and · axial · 3.0mm · 1.12mm/px · z∈[-157,+56]mm · 3 of 72 slices shown (2 of 4)]
[im 1/72]
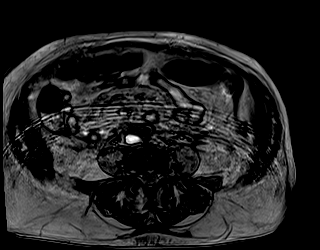
[im 36/72]
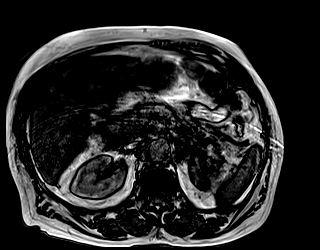
[im 72/72]
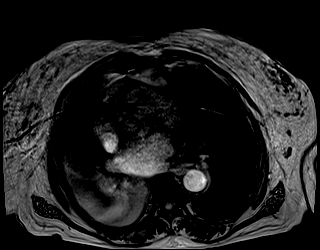

[Series 12: MRCP · coronal · 4.0mm · 1.12mm/px · 1 of 15 slices shown]
[im 1/15]
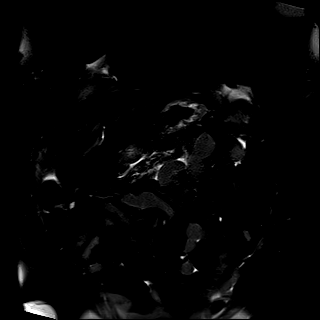

[Series 13: radials · coronal · 50.0mm · 0.78mm/px · 1 of 3 slices shown]
[im 1/3]
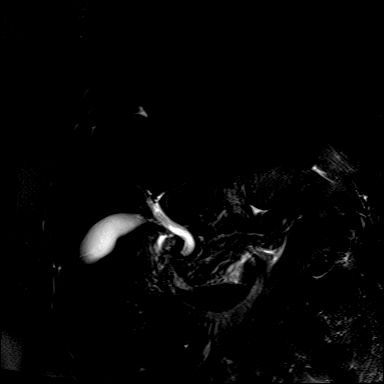

[Series 14: ax in and · axial · 3.0mm · 1.12mm/px · z∈[-157,+56]mm · 2 of 72 slices shown (3 of 4)]
[im 1/72]
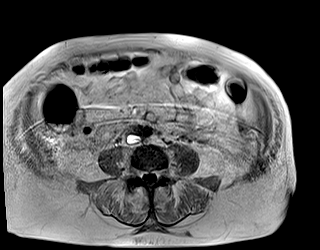
[im 72/72]
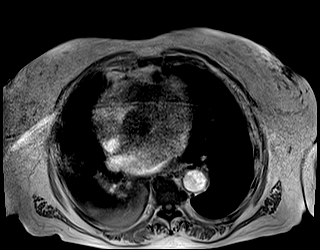

[Series 14: ax in and · axial · 3.0mm · 1.12mm/px · z∈[-157,+56]mm · 2 of 72 slices shown (4 of 4)]
[im 1/72]
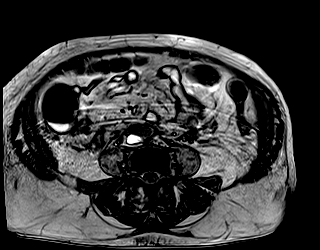
[im 72/72]
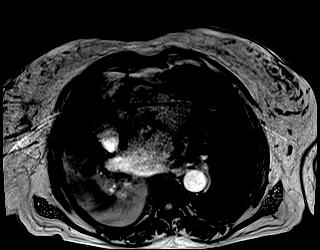

[Series 15: T1 dynamic · axial · non-contrast · 3.0mm · 1.19mm/px · z∈[-157,+56]mm · 2 of 72 slices shown (1 of 5)]
[im 1/72]
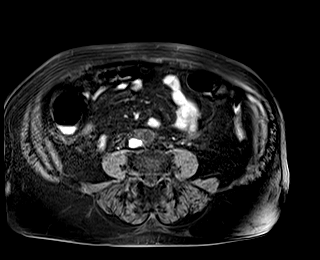
[im 72/72]
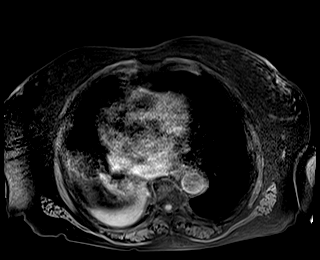

[Series 17: T1 dynamic post-contrast · axial · 3.0mm · 1.19mm/px · z∈[-157,+56]mm · 2 of 72 slices shown (1 of 5)]
[im 1/72]
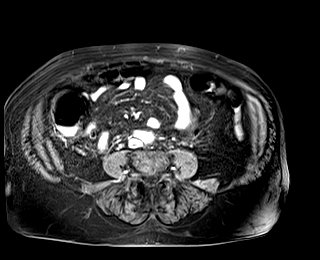
[im 72/72]
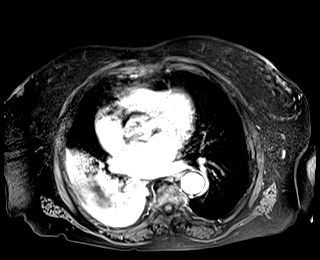

[Series 18: T1 dynamic · axial · 3.0mm · 1.19mm/px · z∈[-157,+56]mm · 2 of 72 slices shown (2 of 5)]
[im 1/72]
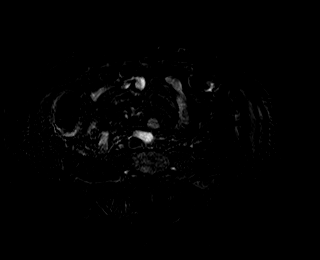
[im 72/72]
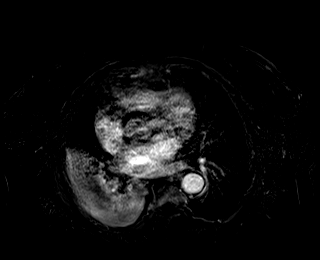

[Series 19: T1 dynamic post-contrast · axial · 3.0mm · 1.19mm/px · z∈[-157,+56]mm · 2 of 72 slices shown (2 of 5)]
[im 1/72]
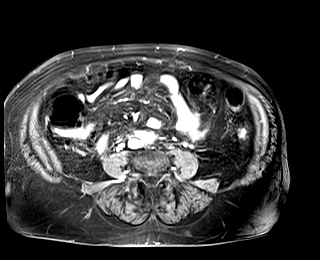
[im 72/72]
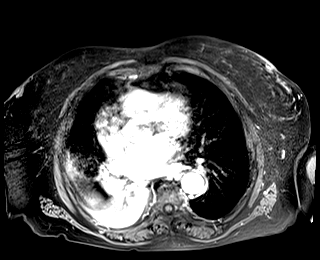

[Series 20: T1 dynamic · axial · 3.0mm · 1.19mm/px · z∈[-157,+56]mm · 2 of 72 slices shown (3 of 5)]
[im 1/72]
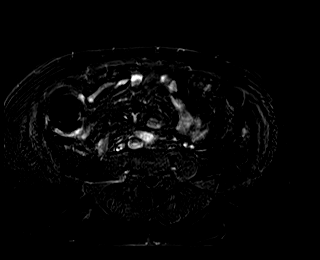
[im 72/72]
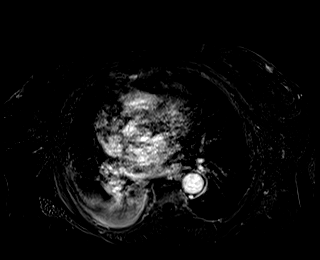

[Series 21: T1 dynamic post-contrast · axial · 3.0mm · 1.19mm/px · z∈[-157,+56]mm · 2 of 72 slices shown (3 of 5)]
[im 1/72]
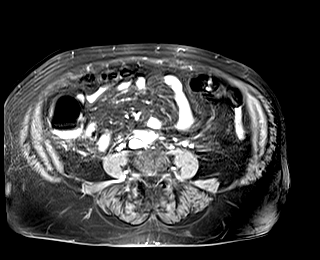
[im 72/72]
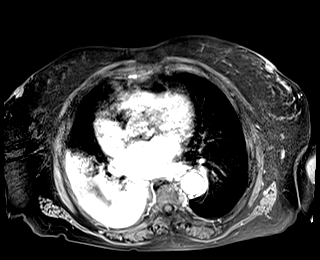

[Series 22: T1 dynamic · axial · 3.0mm · 1.19mm/px · z∈[-157,+56]mm · 2 of 72 slices shown (4 of 5)]
[im 1/72]
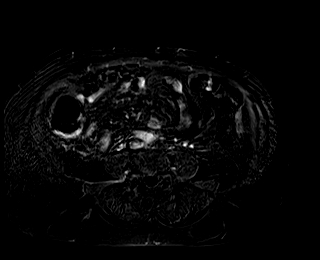
[im 72/72]
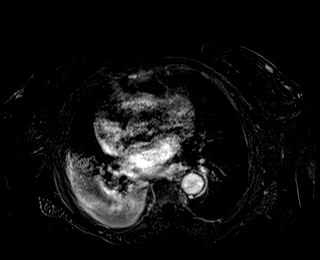

[Series 23: T1 dynamic post-contrast · axial · 3.0mm · 1.19mm/px · z∈[-157,+56]mm · 2 of 72 slices shown (4 of 5)]
[im 1/72]
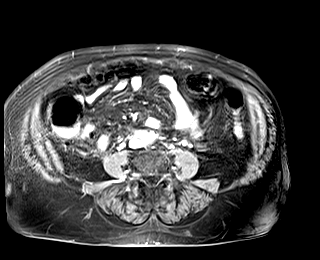
[im 72/72]
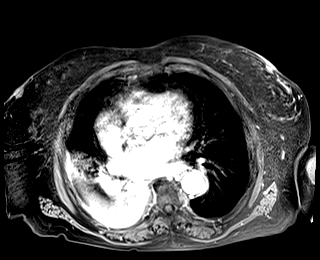

[Series 24: T1 dynamic · axial · 3.0mm · 1.19mm/px · z∈[-157,+56]mm · 2 of 72 slices shown (5 of 5)]
[im 1/72]
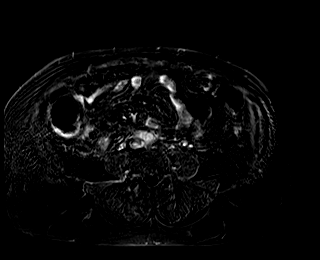
[im 72/72]
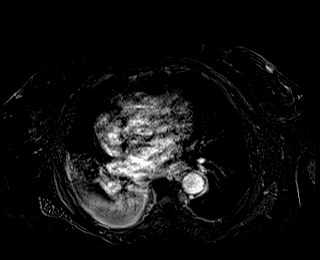

[Series 25: T1 dynamic post-contrast · coronal · 3.0mm · 1.19mm/px · 2 of 72 slices shown (5 of 5)]
[im 1/72]
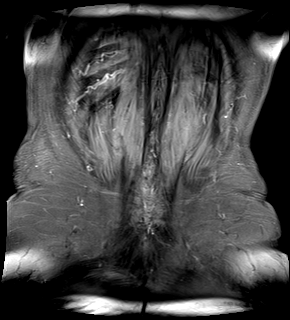
[im 72/72]
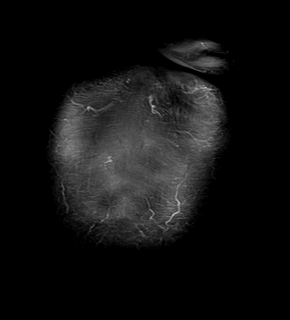

[Series 1042: out of phase · axial · 3.0mm · 1.12mm/px · z∈[-157,+56]mm · 2 of 72 slices shown]
[im 1/72]
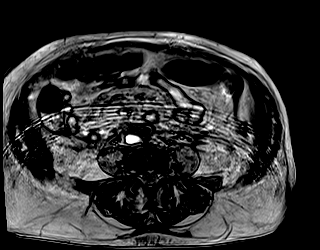
[im 72/72]
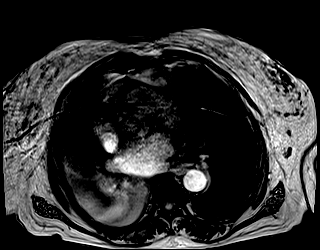

[44 of 48 positions shown; findings below may reference images not displayed]

FINDINGS: Lower chest: Right lower lobe atelectasis/collapse, better evaluated
on recent CT.

Hepatobiliary: Moderate hepatic steatosis. No suspicious/enhancing
hepatic lesions.

Gallbladder is unremarkable. No cholelithiasis is seen.

No intrahepatic ductal dilatation. Common duct measures 6 mm and
tapers at the ampulla. No choledocholithiasis is seen.

Pancreas: Peripancreatic inflammatory changes, reflecting acute
pancreatitis. No pancreatic mass or atrophy. No pancreatic necrosis
or hemorrhage. No well-defined peripancreatic fluid
collection/pseudocyst.

Spleen:  Within normal limits.

Adrenals/Urinary Tract:  Adrenal glands are within normal limits.

Kidneys are within normal limits. No hydronephrosis.

Stomach/Bowel: Stomach is within normal limits.

Wall thickening with inflammatory changes involving the 2nd/3rd
portion of the duodenum (series 5/image 15), suggesting secondary
duodenitis.

Localized pericolonic mesenteric gas/pneumatosis in the left upper
abdomen involving a loop of left colon is not well visualized on MR.

Vascular/Lymphatic:  No evidence of aneurysm.

No suspicious abdominal lymphadenopathy.

Other: No abdominal ascites.

Musculoskeletal: No focal osseous lesions.
IMPRESSION: Acute pancreatitis, without pancreatic necrosis or hemorrhage. No
peripancreatic fluid collection/pseudocyst.

Secondary wall thickening involving the duodenum, suggesting
duodenitis.

No intrahepatic or extrahepatic ductal dilatation. No cholelithiasis
or choledocholithiasis is seen.

## 2019-07-14 MED ORDER — GADOBUTROL 1 MMOL/ML IV SOLN
7.0000 mL | Freq: Once | INTRAVENOUS | Status: AC | PRN
  Administered 2019-07-14: 7 mL via INTRAVENOUS

## 2019-07-14 MED ORDER — FENTANYL CITRATE (PF) 100 MCG/2ML IJ SOLN: 50.0000 ug | INTRAMUSCULAR | Status: DC

## 2019-07-14 MED ORDER — IOHEXOL 300 MG/ML  SOLN
100.0000 mL | Freq: Once | INTRAMUSCULAR | Status: AC | PRN
  Administered 2019-07-14: 100 mL via INTRAVENOUS

## 2019-07-14 MED ORDER — ENOXAPARIN SODIUM 40 MG/0.4ML ~~LOC~~ SOLN
40.0000 mg | SUBCUTANEOUS | Status: DC
  Administered 2019-07-14 – 2019-07-16 (×3): 40 mg via SUBCUTANEOUS
  Filled 2019-07-14 (×3): qty 0.4

## 2019-07-14 MED ORDER — FENTANYL CITRATE (PF) 100 MCG/2ML IJ SOLN
50.0000 ug | Freq: Once | INTRAMUSCULAR | Status: AC
  Administered 2019-07-14: 50 ug via INTRAVENOUS
  Filled 2019-07-14: qty 2

## 2019-07-14 MED ORDER — THIAMINE HCL 100 MG/ML IJ SOLN: 100.0000 mg | Freq: Every day | INTRAMUSCULAR | Status: DC

## 2019-07-14 MED ORDER — ONDANSETRON HCL 4 MG/2ML IJ SOLN
4.0000 mg | Freq: Once | INTRAMUSCULAR | Status: AC
  Administered 2019-07-14: 02:00:00 4 mg via INTRAVENOUS
  Filled 2019-07-14: qty 2

## 2019-07-14 MED ORDER — LACTATED RINGERS IV SOLN: INTRAVENOUS | Status: DC

## 2019-07-14 MED ORDER — ADULT MULTIVITAMIN W/MINERALS CH
1.0000 | ORAL_TABLET | Freq: Every day | ORAL | Status: DC
  Administered 2019-07-14 – 2019-07-18 (×5): 1 via ORAL
  Filled 2019-07-14 (×5): qty 1

## 2019-07-14 MED ORDER — ACETYLCYSTEINE 20 % IN SOLN
4.0000 mL | RESPIRATORY_TRACT | Status: AC
  Administered 2019-07-15 – 2019-07-16 (×5): 4 mL via RESPIRATORY_TRACT
  Filled 2019-07-14 (×9): qty 4

## 2019-07-14 MED ORDER — THIAMINE HCL 100 MG PO TABS
100.0000 mg | ORAL_TABLET | Freq: Every day | ORAL | Status: DC
  Administered 2019-07-14 – 2019-07-18 (×5): 100 mg via ORAL
  Filled 2019-07-14 (×5): qty 1

## 2019-07-14 MED ORDER — FOLIC ACID 1 MG PO TABS
1.0000 mg | ORAL_TABLET | Freq: Every day | ORAL | Status: DC
  Administered 2019-07-14 – 2019-07-18 (×5): 1 mg via ORAL
  Filled 2019-07-14 (×5): qty 1

## 2019-07-14 MED ORDER — SODIUM CHLORIDE 0.9 % IV SOLN: INTRAVENOUS | Status: DC

## 2019-07-14 MED ORDER — ACETAMINOPHEN 650 MG RE SUPP: 650.0000 mg | Freq: Four times a day (QID) | RECTAL | Status: DC | PRN

## 2019-07-14 MED ORDER — SODIUM CHLORIDE 0.9 % IV SOLN
3.0000 g | Freq: Four times a day (QID) | INTRAVENOUS | Status: DC
  Administered 2019-07-15 – 2019-07-18 (×14): 3 g via INTRAVENOUS
  Filled 2019-07-14 (×3): qty 3
  Filled 2019-07-14 (×2): qty 8
  Filled 2019-07-14 (×3): qty 3
  Filled 2019-07-14: qty 8
  Filled 2019-07-14 (×3): qty 3
  Filled 2019-07-14: qty 8
  Filled 2019-07-14 (×3): qty 3
  Filled 2019-07-14: qty 8
  Filled 2019-07-14 (×2): qty 3

## 2019-07-14 MED ORDER — MAGNESIUM SULFATE 2 GM/50ML IV SOLN
2.0000 g | Freq: Once | INTRAVENOUS | Status: AC
  Administered 2019-07-14: 2 g via INTRAVENOUS
  Filled 2019-07-14: qty 50

## 2019-07-14 MED ORDER — OXYCODONE HCL ER 15 MG PO T12A
15.0000 mg | EXTENDED_RELEASE_TABLET | Freq: Two times a day (BID) | ORAL | Status: DC
  Administered 2019-07-14 – 2019-07-15 (×4): 15 mg via ORAL
  Filled 2019-07-14 (×5): qty 1

## 2019-07-14 MED ORDER — ACETAMINOPHEN 325 MG PO TABS
650.0000 mg | ORAL_TABLET | Freq: Four times a day (QID) | ORAL | Status: DC
  Administered 2019-07-14 – 2019-07-17 (×10): 650 mg via ORAL
  Filled 2019-07-14 (×13): qty 2

## 2019-07-14 MED ORDER — ALBUTEROL SULFATE (2.5 MG/3ML) 0.083% IN NEBU
2.5000 mg | INHALATION_SOLUTION | RESPIRATORY_TRACT | Status: DC | PRN
  Administered 2019-07-15 – 2019-07-16 (×5): 2.5 mg via RESPIRATORY_TRACT
  Filled 2019-07-14 (×6): qty 3

## 2019-07-14 MED ORDER — ACETAMINOPHEN 325 MG PO TABS: 650.0000 mg | ORAL_TABLET | Freq: Four times a day (QID) | ORAL | Status: DC | PRN

## 2019-07-14 MED ORDER — OXYCODONE HCL 5 MG PO TABS
5.0000 mg | ORAL_TABLET | ORAL | Status: DC | PRN
  Administered 2019-07-14: 5 mg via ORAL
  Filled 2019-07-14: qty 1

## 2019-07-14 NOTE — ED Notes (Signed)
Lunch Tray Ordered @ 1017. 

## 2019-07-14 NOTE — H&P (Addendum)
[facility] Admission History and Physical Service Pager: [phone]  Patient name: Natasha Holland Medical record number: [id number] Date of birth: 09/27/1950 Age: 69 y.o. Gender: female  Primary Care Provider: Patient, No Pcp Per Consultants: None Code Status: DNI Preferred Emergency Contact: Jamyla Montera (Sister) [phone]  Chief Complaint: Abdominal pain  Assessment and Plan: Natasha Holland is a 69 y.o. female presenting with abdominal pain. PMH is significant for HTN, hyperglycemia, and ETOH abuse.   Abdomina Pain 2/2 Acute pancreatits Presented to the ED following abdominal and food emesis x1 which started yesterday after a meal of pizza and 2 eggs. She describes it as a diffuse aching pain that spans the upper quadrants of the abdomen. She drinks 4-5 cans of 8oz/day for a "long time". Her last drink was last night. Has had decreased PO intake since yesterday. In the ED she was given fentanyl 62mcg x2 and zofran. BP 144/87. HR 102. 92% on 2LNC (Does not require oxygen at home and is not experiencing dyspnea at this time). Hgb 16.5. Plt 101. Na 133. Glu 116. Albumin 3.2. AST 168. ALT 65. Lipase 979. COVID pending. CT Abd/pelvis acute pancreatitis, no evidence of pancreatic necrosis or pseudocyst, mildly dilated distal common bile duct, no calcified stones are seen. Hepatic steatosis. Etiology highly likely associated with chronic ETOH use as liver function enzymes suggest this. Could consider possible gallstone not seen on imaging due to the visualized dilated common bile duct. For possible hypertriglyceridemia we will obtain a full lipid panel. No surgical hx so unlikely to be post-ERCP pancreatitis or pancreatic duct injury. Unlikely to be an anatomic anomaly presenting this late and presumed normal anatomy with imaging. We will admit and give IVF for decrease PO intake and consider consulting GI with further recommendations. -Admit to FPTS, Attending Dr. McDiarmid   -Consider GI consult, CLD for now -Obtain full lipid panel, hgb A1c, Mg, Phos -AM CBC -f/u COVID (contact and airborne precautions) -AM Cr, UA -IVF NS 62ml/h -Tylenol [id number]  -Start folic acid, multi vitamin, and thiamine -Vital signs per protocol -Wean off of O2 as tolerated  ETOH Abuse Long time heavy drinker. Endorses 4-5 8oz cans of beer daily. Could be likely upwards of 7-8 cans. Last drink was yesterday. Has never withdrawn before.  -Start folic acid, multi vitamin, and thiamine -CIWAs w/o PRN ativan -Consider ativan in subsequent days with increasing CIWA scores  HTN BP range on admission SBP 134-172 DBP 84-95. Not on any home medication. Not currently experiencing headache or changes in vision. -Consider started an antihypertensive outpatient or a PRN medication  Polycythemia Vera  Hgb 16.5. although low platelet count of 101 likely due to alcohol use. Does not currently endorse itching.  -Continue to monitor  Hx of hyperglycemia Glucose on admission BMP 116.  -Obtain hemoglobin A1c  GOC: does not currently have pcp and would llike to start seeing one regularly -consult TOC for pcp  FEN/GI: clear liquid, ADAT Prophylaxis: Lovenox 40mg   Disposition: Med-Surg, FPTS  History of Present Illness:  Natasha Holland is a 69 y.o. female presenting with abdominal pain x1 day. Her abdominal discomfort started yesterday associated with emesis x1 that was of food content. She ate pizza and 2 eggs prior to the emesis episode. She has been drinking 4-5 cans of beer 8oz/day for a "long time". Her last drink was last night.   She does not endorse associated nausea, diarrhea, or constipation. She has had decreased appetite since yesterday and has not  eaten anything since the emesis episode. Last bowel movement was yesterday morning. No other concerns at this time. She does not take any medications at home. No other known health issue. No abdominal surgeries.    Denies illicit drug use. CPR  but DNI. Seen her mother on a vent and does not want that.  No current PCP but would like one.    Review Of Systems: Per HPI with the following additions:   Review of Systems  Constitutional: Negative for fever.  Eyes: Negative for blurred vision.  Respiratory: Negative for cough and shortness of breath.   Cardiovascular: Negative for chest pain.  Gastrointestinal: Positive for abdominal pain and vomiting. Negative for constipation, diarrhea, melena and nausea.    There are no problems to display for this patient.   Past Medical History: Past Medical History:  Diagnosis Date  . Hypertension   . Polycythemia vera(238.4)     Past Surgical History: No past surgical history on file.  Social History: Social History   Tobacco Use  . Smoking status: Former Smoker    Packs/day: 1.00    Types: Cigarettes  Substance Use Topics  . Alcohol use: Yes    Alcohol/week: 7.0 standard drinks    Types: 7 Cans of beer per week  . Drug use: No   Additional social history: Quit smoking 4 yeas ago Please also refer to relevant sections of EMR.  Family History: No family history on file. Mom was on vent for an unknown reason  Allergies and Medications: No Known Allergies No current facility-administered medications on file prior to encounter.   Current Outpatient Medications on File Prior to Encounter  Medication Sig Dispense Refill  . triamterene-hydrochlorothiazide (MAXZIDE-25) 37.5-25 MG per tablet Take 1 each (1 tablet total) by mouth daily. (Patient not taking: Reported on 07/14/2019) 60 tablet 0    Objective: BP (!) 172/94   Pulse (!) 102   Temp 97.6 F (36.4 C) (Oral)   Resp 15   SpO2 94%  Exam: General: Well appearing, no acute distress. Age appropriate. Lying in bed. Cardiac: RRR, normal heart sounds, no murmurs Respiratory: CTAB, normal effort Abdomen: soft, tender to palpation, nondistended, +BS Extremities: No edema or cyanosis. 5/5 U+L ext. Skin: Warm and dry,  no rashes noted Neuro: PERRL, alert and oriented, no focal deficits Psych: normal affect   Labs and Imaging: CBC BMET  Recent Labs  Lab 07/14/19 0138  WBC 5.8  HGB 16.5*  HCT 51.1*  PLT 101*   Recent Labs  Lab 07/14/19 0138  NA 133*  K 4.5  CL 98  CO2 20*  BUN 7*  CREATININE 0.58  GLUCOSE 116*  CALCIUM 8.7*     EKG: Pending  CT ABDOMEN AND PELVIS WITH CONTRAST COMPARISON:  CT chest May 02, 2010 IMPRESSION: 1. Findings consistent with acute pancreatitis with diffuse inflammatory changes, however predominantly around the proximal and mid body. No evidence of pancreatic necrosis or pseudocyst. 2. Mildly dilated distal common bile duct at the level of the ampulla, however no calcified stones are seen. 3. Hepatic steatosis 4. Inflammatory changes surrounding the first and second portion of the duodenum. 5. Patchy airspace consolidation with air bronchogram seen at the posterior right lung base which could be due to atelectasis and/or infectious etiology. 6.  Aortic Atherosclerosis (ICD10-I70.0).    Gerlene Fee, DO 07/14/2019, 5:49 AM PGY-1, [place] Intern pager: [phone], text pages welcome  FPTS Upper-Level Resident Addendum   I have independently interviewed and examined  the patient. I have discussed the above with the original author and agree with their documentation. My edits for correction/addition/clarification are in blue. Please see also any attending notes.    Sherene Sires, DO PGY-3, [facility] 07/14/2019 8:05 AM  FPTS Service pager: [phone] (text pages welcome through [facility])

## 2019-07-14 NOTE — Progress Notes (Signed)
Family Medicine Teaching Service Daily Progress Note Intern Pager: [phone]  Patient name: Natasha Holland Medical record number: [id number] Date of birth: [birth year] Age: 69 y.o. Gender: female  Primary Care Provider: Patient, No Pcp Per Consultants: None Code Status: DNI  Pt Overview and Major Events to Date:  02/09-admitted  Assessment and Plan: Natasha Holland is a 69 y.o. female presenting with abdominal pain. PMH is significant for HTN, hyperglycemia, and ETOH abuse.   Acute Pancreatitis likely secondary to ETOH Hypertensive and tachycardic.  Continues to have diffuse abdominal pain.  Received 2 doses of fentanyl 50 mics IV in the ED.  Patient has not requested anything for pain since 4:30 AM.  Patient is hypertensive, systolic blood pressure 0000000 to 170s and tachycardic, heart rate low 100s.  Labs consistent with acute pancreatitis.  CT abdomen findings are consistent with acute pancreatitis with diffuse inflammatory changes.  No evidence of pancreatic necrosis or pseudocyst.  Mild dilated distal common bile duct at the level of the ampulla but no calcified stone seen.  Hepatic steatosis.  Inflammatory changes at the first and second portion of the duodenum.  Patchy airspace consolidation seen in the posterior right lung base possibly due to atelectasis or infectious etiology.  Lipid panel significant for elevated LDL of 128 and cholesterol 212.  On exam patient well-appearing.  Diffuse abdominal tenderness to light palpation. -Clear liquids, advance diet as tolerated -IV fluids N/S at 75cc/hr -Start Oxycodone IR 5 mg every 4 as needed -Start OxyContin 15 mg twice daily -Tylenol 650 mg every 6h -Consider Chest xray -Follow-up Covid, contact antibiotic precautions -BMP in a.m. -Vital signs per protocol -Continuous cardiac and pulse ox monitoring -Wean O2 as tolerated -Incentive spirometer  ETOH Abuse Last EtOH 02/08.  CIWA score 1.  Patient alert and orientated x4.  Denies any headaches,  auditory or visual hallucinations.  On exam nontremulous. -Continue folic acid, multivitamins and thiamine -CIWA precautions -Notify MD if CIWA score greater than 5  HTN Systolic blood pressures [id number].  Patient does not take any home medication.  Is currently having abdominal pain.  Denies any chest pain, shortness of breath, headache or visual changes. -Continue to monitor -Continues to have hypertension with pain control can consider adding antihypertensive  Polycythemia Vera with thrombocytopenia  Hgb 16.5. although low platelet count of 101 likely due to alcohol use. -Continue to monitor  Hx of hyperglycemia Serum glucose 116 on admission -HbA1c pending  GOC: does not currently have pcp and would llike to start seeing one regularly -consult TOC for pcp  FEN/GI:  -Clear liquids, advance diet as tolerated  Prophylaxis:  -Lovenox 40 mg subcu  Disposition: Home when medically stable  Subjective:  Patient is recently admitted.  No acute events.  Continues to have diffuse abdominal pain.  Denies any chest pain, shortness of breath, headaches or visual disturbances.  Does endorse having a cough.  Objective: Temp:  [97.6 F (36.4 C)] 97.6 F (36.4 C) (02/08 2010) Pulse Rate:  [95-106] 103 (02/09 0500) Resp:  [15-26] 20 (02/09 0500) BP: (116-176)/(76-95) 144/87 (02/09 0500) SpO2:  [92 %-97 %] 92 % (02/09 0500) Physical Exam: General: 69 year old female in no acute distress Cardiovascular: Tachycardia, heart rate 100s, regular rate, no murmurs or gallops appreciated Respiratory: Chest clear to auscultation bilaterally diminished at the bases.  No increased work of breathing. Abdomen: Diffuse tenderness on light palpation, bowel sounds present Extremities: No lower extremity edema  Laboratory: Recent Labs  Lab 07/14/19 0138  WBC 5.8  HGB  16.5*  HCT 51.1*  PLT 101*   Recent Labs  Lab 07/14/19 0138  NA 133*  K 4.5  CL 98  CO2 20*  BUN 7*  CREATININE 0.58   CALCIUM 8.7*  PROT 7.5  BILITOT 1.1  ALKPHOS 88  ALT 65*  AST 168*  GLUCOSE 116*      Imaging/Diagnostic Tests: CT ABDOMEN PELVIS W CONTRAST  Result Date: 07/14/2019 CLINICAL DATA:  Lower abdominal pain nausea vomiting EXAM: CT ABDOMEN AND PELVIS WITH CONTRAST TECHNIQUE: Multidetector CT imaging of the abdomen and pelvis was performed using the standard protocol following bolus administration of intravenous contrast. CONTRAST:  152mL OMNIPAQUE IOHEXOL 300 MG/ML  SOLN COMPARISON:  CT chest May 02, 2010 FINDINGS: Lower chest: The visualized heart size within normal limits. No pericardial fluid/thickening. No hiatal hernia. There is patchy airspace consolidation in the posterior right lung base with small air bronchograms. Hepatobiliary: There is diffuse low density seen throughout the liver parenchyma.The main portal vein is patent. No evidence of calcified gallstones, gallbladder wall thickening. There appears to be mild dilation of the common bile duct at the level of the ampulla measuring up to 1 cm. However no calcified stones are seen. Pancreas: There is significant mesenteric fat stranding changes seen surrounding the entirety of the pancreas, predominantly around the proximal and mid body. No loculated fluid collections are seen. The SMV is patent. Normal pancreatic enhancement seen throughout. Spleen: Normal in size without focal abnormality. Adrenals/Urinary Tract: Both adrenal glands appear normal. The kidneys and collecting system appear normal without evidence of urinary tract calculus or hydronephrosis. Bladder is unremarkable. Stomach/Bowel: The stomach is normal in appearance. There appears to be mild wall thickening seen surrounding the first and second portion of the duodenum. The remainder of the small bowel and colon are normal in size and contour.The appendix is normal. Vascular/Lymphatic: There are no enlarged mesenteric, retroperitoneal, or pelvic lymph nodes. Scattered aortic  atherosclerotic calcifications are seen without aneurysmal dilatation. Reproductive: The uterus and adnexa are unremarkable. Other: No evidence of abdominal wall mass or hernia. Musculoskeletal: No acute or significant osseous findings. IMPRESSION: 1. Findings consistent with acute pancreatitis with diffuse inflammatory changes, however predominantly around the proximal and mid body. No evidence of pancreatic necrosis or pseudocyst. 2. Mildly dilated distal common bile duct at the level of the ampulla, however no calcified stones are seen. 3. Hepatic steatosis 4. Inflammatory changes surrounding the first and second portion of the duodenum. 5. Patchy airspace consolidation with air bronchogram seen at the posterior right lung base which could be due to atelectasis and/or infectious etiology. 6.  Aortic Atherosclerosis (ICD10-I70.0). Electronically Signed   By: Prudencio Pair M.D.   On: 07/14/2019 03:17    Carollee Leitz, MD 07/14/2019, 6:13 AM PGY-1, [place] Intern pager: [phone], text pages welcome

## 2019-07-14 NOTE — ED Provider Notes (Signed)
[facility] DEPART[facility] Provider Note   CSN: [id number] Arrival date & time: 07/13/19  1747     History Chief Complaint  Patient presents with  . Abdominal Pain    Natasha Holland is a 69 y.o. female.  The history is provided by the patient and medical records.    69 y.o. F with hx of HTN, polycythemia vera, presenting to the ED for for abdominal pain.  Patient reports symptoms began this morning and have been worsening throughout the day.  Pain all the way across her lower abdomen, described as a aching pain with some intermittent sharp, stabbing sensations.  She does report nausea throughout the day, threw up during transport with EMS.  States nausea is better but pain is worse after vomiting.  She is not had any diarrhea.  She was able to eat pizza and some double eggs today.  She denies any history of similar symptoms in the past.  She is not had any noted fevers.  No urinary symptoms.  No prior abdominal surgeries.  Past Medical History:  Diagnosis Date  . Hypertension   . Polycythemia vera(238.4)     There are no problems to display for this patient.   No past surgical history on file.   OB History   No obstetric history on file.     No family history on file.  Social History   Tobacco Use  . Smoking status: Former Smoker    Packs/day: 1.00    Types: Cigarettes  Substance Use Topics  . Alcohol use: Yes    Alcohol/week: 7.0 standard drinks    Types: 7 Cans of beer per week  . Drug use: No    Home Medications Prior to Admission medications   Medication Sig Start Date End Date Taking? Authorizing Provider  triamterene-hydrochlorothiazide (MAXZIDE-25) 37.5-25 MG per tablet Take 1 each (1 tablet total) by mouth daily. 09/08/11 01/25/13  Rosana Hoes, MD    Allergies    Patient has no known allergies.  Review of Systems   Review of Systems  Gastrointestinal: Positive for abdominal pain, nausea and vomiting.  All other systems reviewed  and are negative.   Physical Exam Updated Vital Signs BP (!) 134/95 (BP Location: Left Arm)   Pulse (!) 101   Temp 97.6 F (36.4 C) (Oral)   Resp 16   SpO2 97%   Physical Exam Vitals and nursing note reviewed.  Constitutional:      Appearance: She is well-developed.  HENT:     Head: Normocephalic and atraumatic.  Eyes:     Conjunctiva/sclera: Conjunctivae normal.     Pupils: Pupils are equal, round, and reactive to light.  Cardiovascular:     Rate and Rhythm: Normal rate and regular rhythm.     Heart sounds: Normal heart sounds.  Pulmonary:     Effort: Pulmonary effort is normal.     Breath sounds: Normal breath sounds.  Abdominal:     General: Bowel sounds are normal.     Palpations: Abdomen is soft.     Tenderness: There is abdominal tenderness.     Comments: Very mild tenderness across lower abdomen, no distention noted, no peritoneal signs, bowel sounds normal  Musculoskeletal:        General: Normal range of motion.     Cervical back: Normal range of motion.  Skin:    General: Skin is warm and dry.  Neurological:     Mental Status: She is alert and oriented to  person, place, and time.     ED Results / Procedures / Treatments   Labs (all labs ordered are listed, but only abnormal results are displayed) Labs Reviewed  LIPASE, BLOOD - Abnormal; Notable for the following components:      Result Value   Lipase 979 (*)    All other components within normal limits  COMPREHENSIVE METABOLIC PANEL - Abnormal; Notable for the following components:   Sodium 133 (*)    CO2 20 (*)    Glucose, Bld 116 (*)    BUN 7 (*)    Calcium 8.7 (*)    Albumin 3.2 (*)    AST 168 (*)    ALT 65 (*)    All other components within normal limits  CBC - Abnormal; Notable for the following components:   RBC 5.20 (*)    Hemoglobin 16.5 (*)    HCT 51.1 (*)    Platelets 101 (*)    All other components within normal limits  SARS CORONAVIRUS 2 (TAT 6-24 HRS)  URINALYSIS, ROUTINE W  REFLEX MICROSCOPIC    EKG None  Radiology CT ABDOMEN PELVIS W CONTRAST  Result Date: 07/14/2019 CLINICAL DATA:  Lower abdominal pain nausea vomiting EXAM: CT ABDOMEN AND PELVIS WITH CONTRAST TECHNIQUE: Multidetector CT imaging of the abdomen and pelvis was performed using the standard protocol following bolus administration of intravenous contrast. CONTRAST:  170m OMNIPAQUE IOHEXOL 300 MG/ML  SOLN COMPARISON:  CT chest May 02, 2010 FINDINGS: Lower chest: The visualized heart size within normal limits. No pericardial fluid/thickening. No hiatal hernia. There is patchy airspace consolidation in the posterior right lung base with small air bronchograms. Hepatobiliary: There is diffuse low density seen throughout the liver parenchyma.The main portal vein is patent. No evidence of calcified gallstones, gallbladder wall thickening. There appears to be mild dilation of the common bile duct at the level of the ampulla measuring up to 1 cm. However no calcified stones are seen. Pancreas: There is significant mesenteric fat stranding changes seen surrounding the entirety of the pancreas, predominantly around the proximal and mid body. No loculated fluid collections are seen. The SMV is patent. Normal pancreatic enhancement seen throughout. Spleen: Normal in size without focal abnormality. Adrenals/Urinary Tract: Both adrenal glands appear normal. The kidneys and collecting system appear normal without evidence of urinary tract calculus or hydronephrosis. Bladder is unremarkable. Stomach/Bowel: The stomach is normal in appearance. There appears to be mild wall thickening seen surrounding the first and second portion of the duodenum. The remainder of the small bowel and colon are normal in size and contour.The appendix is normal. Vascular/Lymphatic: There are no enlarged mesenteric, retroperitoneal, or pelvic lymph nodes. Scattered aortic atherosclerotic calcifications are seen without aneurysmal dilatation.  Reproductive: The uterus and adnexa are unremarkable. Other: No evidence of abdominal wall mass or hernia. Musculoskeletal: No acute or significant osseous findings. IMPRESSION: 1. Findings consistent with acute pancreatitis with diffuse inflammatory changes, however predominantly around the proximal and mid body. No evidence of pancreatic necrosis or pseudocyst. 2. Mildly dilated distal common bile duct at the level of the ampulla, however no calcified stones are seen. 3. Hepatic steatosis 4. Inflammatory changes surrounding the first and second portion of the duodenum. 5. Patchy airspace consolidation with air bronchogram seen at the posterior right lung base which could be due to atelectasis and/or infectious etiology. 6.  Aortic Atherosclerosis (ICD10-I70.0). Electronically Signed   By: BPrudencio PairM.D.   On: 07/14/2019 03:17    Procedures Procedures (including critical care time)  Medications Ordered in ED Medications  sodium chloride flush (NS) 0.9 % injection 3 mL (3 mLs Intravenous Given 07/14/19 0157)  fentaNYL (SUBLIMAZE) injection 50 mcg (50 mcg Intravenous Given 07/14/19 0156)  ondansetron (ZOFRAN) injection 4 mg (4 mg Intravenous Given 07/14/19 0153)  iohexol (OMNIPAQUE) 300 MG/ML solution 100 mL (100 mLs Intravenous Contrast Given 07/14/19 0308)  fentaNYL (SUBLIMAZE) injection 50 mcg (50 mcg Intravenous Given 07/14/19 0427)    ED Course  I have reviewed the triage vital signs and the nursing notes.  Pertinent labs & imaging results that were available during my care of the patient were reviewed by me and considered in my medical decision making (see chart for details).    MDM Rules/Calculators/A&P  69 year old female presenting to the ED with abdominal pain.  Began this morning upon awakening, has been worsening throughout the day.  She reports nausea throughout the day and vomiting x1.  She is afebrile and nontoxic, points to her lower abdomen as area of pain, some mild tenderness noted.   No peritoneal signs.  Bowel sounds are normal.  Labs are pending.  We will plan for CT scan for further evaluation.  Labs consistent with acute pancreatitis lipase 979, LFT's 168 and 65, bili and alk phos WNL.  CT with findings of acute pancreatitis with diffuse inflammatory changes, no necrosis or pseudocyst.  She also has a mildly dilated distal common bile duct however no calcified stones are seen.  When discussing with patient, she does report daily alcohol use for several years now.  She reports 4-6 beers every night.  Denies drinking to the point of blacking out.  This is likely source of her pancreatitis.  After medications here she did feel somewhat better, however given her age and current findings, I am concerned that she will go home and continue to drink worsening her symptoms.  There is also the possibility that she will stop drinking abruptly and go into acute withdrawal.  Feel it would be in her benefit for admission for ongoing care.  May also benefit from MRCP given dilated CBD.  Discussed with family practice residents-- will admit for ongoing care.  Final Clinical Impression(s) / ED Diagnoses Final diagnoses:  Alcohol-induced acute pancreatitis without infection or necrosis    Rx / DC Orders ED Discharge Orders    None       Larene Pickett, PA-C 07/14/19 [place], [place], DO 07/14/19 [phone]

## 2019-07-14 NOTE — ED Notes (Signed)
Pt 88% on room air. Placed on 2L [place].

## 2019-07-14 NOTE — ED Notes (Signed)
Pt declines this RN's offer to call her family for an update, stating she already has with her phone.

## 2019-07-14 NOTE — ED Notes (Signed)
Pt given water for PO challenge 

## 2019-07-14 NOTE — Consult Note (Signed)
NAME:  Natasha Holland, MRN:  [id number], DOB:  [birth year], LOS: 0 ADMISSION DATE:  07/13/2019, CONSULTATION DATE:  2/9 REFERRING MD:  Teaching service, CHIEF COMPLAINT:  RLL collapase   Brief History   X  History of present illness -obtained from the patient and review of the chart   69 year old female known regular alcoholic drinking 4 to 5 cans of beer 8 ounces per day of alcohol for many years..  Last drink 1 day before admission presented with emesis for 1 day after meal of pizza and 2 eggs diffuse abdominal pain in the upper quadrants of the abdomen along with diminished p.o. intake.  Her lipase at admission was 979.  Abdominal imaging with a CT scan suggested acute pancreatitis but no evidence of pancreatic necrosis or pseudocyst and mildly dilated 1 cm distal common bile duct without any stones.  Admitting clinician has suspected alcoholic pancreatitis with secondary differential diagnosis of gallstone pancreatitis and admitted the patient for the same associated with monitoring for alcohol withdrawal.  Imaging did not show any evidence of cirrhosis but just a fatty liver.  There is also concern for extraluminal air around the sigmoid colon.  MRCP did not show any evidence of choledocholithiasis.  Due to the extraluminal air she was seen by Dr. Donne Hazel of surgery who noticed epigastric and right upper quadrant tenderness but given his stable vital signs both GI Dr. Benson Norway and surgeon Dr. Donne Hazel have advised conservative management with n.p.o.  As part of her admission she had chest x-ray which resulted in a CT scan of the chest that shows complete right lower lobe collapse with possible extension to the right middle lobe.  Therefore pulmonary has been consulted.  Upon personal visualization and interpretation of the image would not agree with the same official report.  In addition she had a CT scan of the chest 9 years ago [around the same time she also had history of poor dentition and had upper  teeth surgery] and there is early onset of right lower lobe consolidation and some postinflammatory atelectatic/fibrotic changes at that time.  Currently she is not endorsing any fever or sputum production or cough.  She is unaware she might be having a pulmonary problem  Past Medical History    has a past medical history of Hypertension and Polycythemia vera(238.4).   reports that she has quit smoking. Her smoking use included cigarettes. She smoked 1.00 pack per day. She has never used smokeless tobacco.  History reviewed. No pertinent surgical history.  No Known Allergies   There is no immunization history on file for this patient.  History reviewed. No pertinent family history.   Current Facility-Administered Medications:  .  acetaminophen (TYLENOL) tablet 650 mg, 650 mg, Oral, Q6H, McDiarmid, Blane Ohara, MD, 650 mg at 07/14/19 1623 .  enoxaparin (LOVENOX) injection 40 mg, 40 mg, Subcutaneous, Q24H, Bland, Scott, DO, 40 mg at 07/14/19 1624 .  folic acid (FOLVITE) tablet 1 mg, 1 mg, Oral, Daily, Bland, Scott, DO, 1 mg at 07/14/19 0951 .  lactated ringers infusion, , Intravenous, Continuous, Carol Ada, MD, Last Rate: 150 mL/hr at 07/14/19 1733, New Bag at 07/14/19 1733 .  multivitamin with minerals tablet 1 tablet, 1 tablet, Oral, Daily, Bland, Scott, DO, 1 tablet at 07/14/19 0951 .  oxyCODONE (Oxy IR/ROXICODONE) immediate release tablet 5 mg, 5 mg, Oral, Q4H PRN, Lockamy, Timothy, DO, 5 mg at 07/14/19 0901 .  oxyCODONE (OXYCONTIN) 12 hr tablet 15 mg, 15 mg, Oral, Q12H, Lockamy, Timothy,  DO, 15 mg at 07/14/19 0951 .  thiamine tablet 100 mg, 100 mg, Oral, Daily, 100 mg at 07/14/19 0901 **OR** thiamine (B-1) injection 100 mg, 100 mg, Intravenous, Daily, Sherene Sires, DO    Significant Hospital Events   07/13/2019  - admit   Consults:  x  Procedures:  x  Significant Diagnostic Tests:  x  Micro Data:  x  Antimicrobials:     Interim history/subjective:  2/9 - seen in  14m09  Objective   Blood pressure (!) 171/86, pulse (!) 101, temperature 98.8 F (37.1 C), temperature source Oral, resp. rate 19, height 5\' 2"  (1.575 m), weight 62 kg, SpO2 96 %.        Intake/Output Summary (Last 24 hours) at 07/14/2019 1747 Last data filed at 07/14/2019 1206 Gross per 24 hour  Intake 50 ml  Output --  Net 50 ml   Filed Weights   07/14/19 1523  Weight: 62 kg    Examination: General: Frail cachectic female without any distress wearing a mask and also head scarf watching TV HENT: No elevated JVP no neck nodes.  Oral cavity examined.  No upper dentition.  Lower dentition is poor and appears to have carious teeth Lungs: Clear to auscultation bilaterally with good air entry.  Possibly diminished air entry in the right lower lobe Cardiovascular: Normal heart sounds Abdomen: Soft, but nonspecifically tender without any reboundno organomegaly Extremities: No cyanosis no clubbing no edema Neuro: Alert and oriented x3 speech normal GU: Not examined  Resolved Hospital Problem list   X  Assessment & Plan:  Right lower lobe collapse in the setting of baseline previous smoking, ongoing alcohol history of poor dentition, right lower lobe consolidative process in the lung 9 years ago and currently with vomiting and acute pancreatitis  Most likely she has had an aspiration event and probably has mucous plugging  Plan -Can check for urine Streptococcus and urine Legionella and serum procalcitonin -Check pro calcitonin -Check lactic acid -Aggressive pulmonary toilet -Empiric Unasyn -Might have to consider flexible video bronchoscopy to evaluate the right lower lobe   #Mild hypomagnesemia -Plan replete magnesium  Best practice:  According to primary service Code Status: Full Family Communication: Patient updated about pulmonary plan Disposition: 5M09     LABS    PULMONARY No results for input(s): PHART, PCO2ART, PO2ART, HCO3, TCO2, O2SAT in the last 168  hours.  Invalid input(s): PCO2, PO2  CBC Recent Labs  Lab 07/14/19 0138 07/14/19 0618  HGB 16.5* 15.1*  HCT 51.1* 45.5  WBC 5.8 7.2  PLT 101* 124*    COAGULATION No results for input(s): INR in the last 168 hours.  CARDIAC  No results for input(s): TROPONINI in the last 168 hours. No results for input(s): PROBNP in the last 168 hours.   CHEMISTRY Recent Labs  Lab 07/14/19 0138 07/14/19 0618  NA 133*  --   K 4.5  --   CL 98  --   CO2 20*  --   GLUCOSE 116*  --   BUN 7*  --   CREATININE 0.58 0.74  CALCIUM 8.7*  --   MG  --  1.7  PHOS  --  4.6   Estimated Creatinine Clearance: 57.5 mL/min (by C-G formula based on SCr of 0.74 mg/dL).   LIVER Recent Labs  Lab 07/14/19 0138  AST 168*  ALT 65*  ALKPHOS 88  BILITOT 1.1  PROT 7.5  ALBUMIN 3.2*     INFECTIOUS No results for input(s): LATICACIDVEN, PROCALCITON in  the last 168 hours.   ENDOCRINE CBG (last 3)  No results for input(s): GLUCAP in the last 72 hours.       IMAGING x48h  - image(s) personally visualized  -   highlighted in bold DG Chest 2 View  Addendum Date: 07/14/2019   ADDENDUM REPORT: 07/14/2019 11:21 ADDENDUM: These results were called by telephone at the time of interpretation on 07/14/2019 at 11:21 am to provider TODD MCDIARMID , who verbally acknowledged these results. Electronically Signed   By: Zetta Bills M.D.   On: 07/14/2019 11:21   Result Date: 07/14/2019 CLINICAL DATA:  Abdominal pain, cough history of hypertension EXAM: CHEST - 2 VIEW COMPARISON:  05/01/2010 FINDINGS: Film is rotated markedly to the right, accounting for this cardiomediastinal contours are stable. Dense basilar consolidation suggested at right lung base silhouetting portion of medial right hemidiaphragm on this projection. No signs of additional consolidative change though study limited by rotation. No acute bone process. IMPRESSION: Dense basilar consolidation suggested at the right lung base silhouetting the  medial right hemidiaphragm. Based on appearance findings could be due to central obstructing pulmonary lesion and/or pneumonia. Suggest chest CT for further evaluation. A call is out to the referring provider to further discuss findings in the above case. Electronically Signed: By: Zetta Bills M.D. On: 07/14/2019 11:01   CT CHEST W CONTRAST  Result Date: 07/14/2019 CLINICAL DATA:  69 year old with abdominal pain. Airspace disease and consolidation in right lung on recent abdominal CT. EXAM: CT CHEST WITH CONTRAST TECHNIQUE: Multidetector CT imaging of the chest was performed during intravenous contrast administration. CONTRAST:  159mL OMNIPAQUE IOHEXOL 300 MG/ML  SOLN COMPARISON:  Abdominal CT 07/14/2019.  Chest CT 05/02/2010 FINDINGS: Cardiovascular: Coronary artery calcifications. Normal caliber of the thoracic aorta. No evidence for an aortic dissection. Great vessels patent. Celiac trunk, SMA and right renal artery are patent. Atherosclerotic disease in the proximal abdominal aorta and at the aortic arch. Mediastinum/Nodes: Borderline enlarged mediastinal lymph nodes. Lymph node in the pretracheal region measures 1.1 cm in short axis on sequence 2, image 52. No definite hilar lymphadenopathy. No axillary or supraclavicular lymphadenopathy. The visualized thyroid tissue is unremarkable. No gross abnormality to the esophagus. Lungs/Pleura: Trachea and mainstem bronchi are patent. Volume loss in the right hemithorax with mediastinal shift towards the right. There is collapse or obstruction in the right lower lobe bronchus just beyond the origin. Right middle lobe bronchus is patent but there is distal consolidation or volume loss in right middle lobe. Underlying centrilobular emphysema most conspicuous in the right upper lobe. Left lung is clear. Minimal volume loss at the left lung base. Upper Abdomen: Diffusely low attenuation of the liver is compatible with steatosis. Again noted are inflammatory changes  and edema centered around the pancreas. Common bile duct is slightly prominent measuring 1.0 cm. No significant intrahepatic biliary dilatation. Normal appearance of the stomach. There is extraluminal gas in the left upper abdomen. In retrospect, there was irregular gas in this region on the recent abdominal CT. The extraluminal gas appears to be confined within the mesocolon surrounding the sigmoid colon and splenic flexure. Gas does not clearly represent pneumatosis. Sigmoid colon extends up to the left upper abdomen and sigmoid colon is dilated with gas without wall thickening. No evidence for free intraperitoneal air on the right side of the abdomen. Musculoskeletal: Old right posterior eleventh rib fracture. IMPRESSION: 1. Extraluminal gas in left upper abdomen centered around the sigmoid colon and splenic flexure area. Sigmoid colon is distended with  gas in this area. This extraluminal gas appears to be confined to the pericolonic tissue or mesocolon. Findings could be related to a bowel perforation but indeterminate based on the recent abdominal CT. 2. Complete collapse of the right lower lobe. Etiology for the collapse is unknown. An obstructing endobronchial lesion cannot be excluded. Consider further characterization with bronchoscopy. Small amount of volume loss in the right middle lobe. 3. Edema and inflammatory changes in the upper abdomen centered around the pancreas. Findings are suggestive for pancreatitis. Distal common bile duct is prominent measuring up to 1.0 cm and cannot exclude a distal biliary obstruction. 4. Hepatic steatosis. 5. Coronary artery calcifications. These results were called by telephone at the time of interpretation on 07/14/2019 at 1:42 pm to provider Nuala Alpha , who verbally acknowledged these results. Electronically Signed   By: Markus Daft M.D.   On: 07/14/2019 13:43   CT ABDOMEN PELVIS W CONTRAST  Result Date: 07/14/2019 CLINICAL DATA:  Lower abdominal pain nausea  vomiting EXAM: CT ABDOMEN AND PELVIS WITH CONTRAST TECHNIQUE: Multidetector CT imaging of the abdomen and pelvis was performed using the standard protocol following bolus administration of intravenous contrast. CONTRAST:  127mL OMNIPAQUE IOHEXOL 300 MG/ML  SOLN COMPARISON:  CT chest May 02, 2010 FINDINGS: Lower chest: The visualized heart size within normal limits. No pericardial fluid/thickening. No hiatal hernia. There is patchy airspace consolidation in the posterior right lung base with small air bronchograms. Hepatobiliary: There is diffuse low density seen throughout the liver parenchyma.The main portal vein is patent. No evidence of calcified gallstones, gallbladder wall thickening. There appears to be mild dilation of the common bile duct at the level of the ampulla measuring up to 1 cm. However no calcified stones are seen. Pancreas: There is significant mesenteric fat stranding changes seen surrounding the entirety of the pancreas, predominantly around the proximal and mid body. No loculated fluid collections are seen. The SMV is patent. Normal pancreatic enhancement seen throughout. Spleen: Normal in size without focal abnormality. Adrenals/Urinary Tract: Both adrenal glands appear normal. The kidneys and collecting system appear normal without evidence of urinary tract calculus or hydronephrosis. Bladder is unremarkable. Stomach/Bowel: The stomach is normal in appearance. There appears to be mild wall thickening seen surrounding the first and second portion of the duodenum. The remainder of the small bowel and colon are normal in size and contour.The appendix is normal. Vascular/Lymphatic: There are no enlarged mesenteric, retroperitoneal, or pelvic lymph nodes. Scattered aortic atherosclerotic calcifications are seen without aneurysmal dilatation. Reproductive: The uterus and adnexa are unremarkable. Other: No evidence of abdominal wall mass or hernia. Musculoskeletal: No acute or significant  osseous findings. IMPRESSION: 1. Findings consistent with acute pancreatitis with diffuse inflammatory changes, however predominantly around the proximal and mid body. No evidence of pancreatic necrosis or pseudocyst. 2. Mildly dilated distal common bile duct at the level of the ampulla, however no calcified stones are seen. 3. Hepatic steatosis 4. Inflammatory changes surrounding the first and second portion of the duodenum. 5. Patchy airspace consolidation with air bronchogram seen at the posterior right lung base which could be due to atelectasis and/or infectious etiology. 6.  Aortic Atherosclerosis (ICD10-I70.0). Electronically Signed   By: Prudencio Pair M.D.   On: 07/14/2019 03:17   MR ABDOMEN MRCP W WO CONTAST  Result Date: 07/14/2019 CLINICAL DATA:  Acute pancreatitis EXAM: MRI ABDOMEN WITHOUT AND WITH CONTRAST (INCLUDING MRCP) TECHNIQUE: Multiplanar multisequence MR imaging of the abdomen was performed both before and after the administration of intravenous contrast.  Heavily T2-weighted images of the biliary and pancreatic ducts were obtained, and three-dimensional MRCP images were rendered by post processing. CONTRAST:  52mL GADAVIST GADOBUTROL 1 MMOL/ML IV SOLN COMPARISON:  CT abdomen/pelvis dated 07/14/2019 FINDINGS: Lower chest: Right lower lobe atelectasis/collapse, better evaluated on recent CT. Hepatobiliary: Moderate hepatic steatosis. No suspicious/enhancing hepatic lesions. Gallbladder is unremarkable. No cholelithiasis is seen. No intrahepatic ductal dilatation. Common duct measures 6 mm and tapers at the ampulla. No choledocholithiasis is seen. Pancreas: Peripancreatic inflammatory changes, reflecting acute pancreatitis. No pancreatic mass or atrophy. No pancreatic necrosis or hemorrhage. No well-defined peripancreatic fluid collection/pseudocyst. Spleen:  Within normal limits. Adrenals/Urinary Tract:  Adrenal glands are within normal limits. Kidneys are within normal limits. No hydronephrosis.  Stomach/Bowel: Stomach is within normal limits. Wall thickening with inflammatory changes involving the 2nd/3rd portion of the duodenum (series 5/image 15), suggesting secondary duodenitis. Localized pericolonic mesenteric gas/pneumatosis in the left upper abdomen involving a loop of left colon is not well visualized on MR. Vascular/Lymphatic:  No evidence of aneurysm. No suspicious abdominal lymphadenopathy. Other: No abdominal ascites. Musculoskeletal: No focal osseous lesions. IMPRESSION: Acute pancreatitis, without pancreatic necrosis or hemorrhage. No peripancreatic fluid collection/pseudocyst. Secondary wall thickening involving the duodenum, suggesting duodenitis. No intrahepatic or extrahepatic ductal dilatation. No cholelithiasis or choledocholithiasis is seen. Electronically Signed   By: Julian Hy M.D.   On: 07/14/2019 15:13

## 2019-07-14 NOTE — ED Notes (Signed)
Pt transported to CT ?

## 2019-07-14 NOTE — Consult Note (Signed)
Reason for Consult: ? choledocholithiasis Referring Physician: Family Medicine  Ninfa Meeker HPI: This is a 69 year old female with a PMH of HTN, PCV, and ETOH abuse admitted for acute pancreatitis.  Her pain started acutely yesterday when she was eating a pizza.  The pain was located in the epigastric region and it was sharp.  She denies any issues with chest pain or SOB, but she had one episode of nausea and vomiting at the time EMS was evaluating her.  Currently she is feeling better.  Work up in the ER was positive for acute pancreatitis with a lipase in the 900 range and a CT scan consistent with that finding.  There was also the suggestion of choledocholithiasis with dilation of her CBD at 1 cm.  Her liver enzymes were consistent with an ETOH pattern.  On a routine basis she drinks a 6 pack of beer per day, but she denied any prior pancreatitis issues with her ETOH abuse.  An MRCP was performed for further evaluation of the ductal dilation and it was negative for choledocholithiasis or any biliary ductal dilation.  Past Medical History:  Diagnosis Date  . Hypertension   . Polycythemia vera(238.4)     History reviewed. No pertinent surgical history.  History reviewed. No pertinent family history.  Social History:  reports that she has quit smoking. Her smoking use included cigarettes. She smoked 1.00 pack per day. She has never used smokeless tobacco. She reports current alcohol use of about 7.0 standard drinks of alcohol per week. She reports that she does not use drugs.  Allergies: No Known Allergies  Medications:  Scheduled: . acetaminophen  650 mg Oral Q6H  . enoxaparin (LOVENOX) injection  40 mg Subcutaneous Q24H  . folic acid  1 mg Oral Daily  . multivitamin with minerals  1 tablet Oral Daily  . oxyCODONE  15 mg Oral Q12H  . thiamine  100 mg Oral Daily   Or  . thiamine  100 mg Intravenous Daily   Continuous: . sodium chloride 100 mL/hr at 07/14/19 [id number]    Results for  orders placed or performed during the hospital encounter of 07/13/19 (from the past 24 hour(s))  Lipase, blood     Status: Abnormal   Collection Time: 07/14/19  1:38 AM  Result Value Ref Range   Lipase 979 (H) 11 - 51 U/L  Comprehensive metabolic panel     Status: Abnormal   Collection Time: 07/14/19  1:38 AM  Result Value Ref Range   Sodium 133 (L) 135 - 145 mmol/L   Potassium 4.5 3.5 - 5.1 mmol/L   Chloride 98 98 - 111 mmol/L   CO2 20 (L) 22 - 32 mmol/L   Glucose, Bld 116 (H) 70 - 99 mg/dL   BUN 7 (L) 8 - 23 mg/dL   Creatinine, Ser 0.58 0.44 - 1.00 mg/dL   Calcium 8.7 (L) 8.9 - 10.3 mg/dL   Total Protein 7.5 6.5 - 8.1 g/dL   Albumin 3.2 (L) 3.5 - 5.0 g/dL   AST 168 (H) 15 - 41 U/L   ALT 65 (H) 0 - 44 U/L   Alkaline Phosphatase 88 38 - 126 U/L   Total Bilirubin 1.1 0.3 - 1.2 mg/dL   GFR calc non Af Amer >60 >60 mL/min   GFR calc Af Amer >60 >60 mL/min   Anion gap 15 5 - 15  CBC     Status: Abnormal   Collection Time: 07/14/19  1:38 AM  Result  Value Ref Range   WBC 5.8 4.0 - 10.5 K/uL   RBC 5.20 (H) 3.87 - 5.11 MIL/uL   Hemoglobin 16.5 (H) 12.0 - 15.0 g/dL   HCT 51.1 (H) 36.0 - 46.0 %   MCV 98.3 80.0 - 100.0 fL   MCH 31.7 26.0 - 34.0 pg   MCHC 32.3 30.0 - 36.0 g/dL   RDW 13.4 11.5 - 15.5 %   Platelets 101 (L) 150 - 400 K/uL   nRBC 0.0 0.0 - 0.2 %  SARS CORONAVIRUS 2 (TAT 6-24 HRS) Nasopharyngeal Nasopharyngeal Swab     Status: None   Collection Time: 07/14/19  5:35 AM   Specimen: Nasopharyngeal Swab  Result Value Ref Range   SARS Coronavirus 2 NEGATIVE NEGATIVE  HIV Antibody (routine testing w rflx)     Status: None   Collection Time: 07/14/19  6:18 AM  Result Value Ref Range   HIV Screen 4th Generation wRfx NON REACTIVE NON REACTIVE  CBC     Status: Abnormal   Collection Time: 07/14/19  6:18 AM  Result Value Ref Range   WBC 7.2 4.0 - 10.5 K/uL   RBC 4.72 3.87 - 5.11 MIL/uL   Hemoglobin 15.1 (H) 12.0 - 15.0 g/dL   HCT 45.5 36.0 - 46.0 %   MCV 96.4 80.0 - 100.0 fL    MCH 32.0 26.0 - 34.0 pg   MCHC 33.2 30.0 - 36.0 g/dL   RDW 13.3 11.5 - 15.5 %   Platelets 124 (L) 150 - 400 K/uL   nRBC 0.0 0.0 - 0.2 %  Creatinine, serum     Status: None   Collection Time: 07/14/19  6:18 AM  Result Value Ref Range   Creatinine, Ser 0.74 0.44 - 1.00 mg/dL   GFR calc non Af Amer >60 >60 mL/min   GFR calc Af Amer >60 >60 mL/min  Magnesium     Status: None   Collection Time: 07/14/19  6:18 AM  Result Value Ref Range   Magnesium 1.7 1.7 - 2.4 mg/dL  Phosphorus     Status: None   Collection Time: 07/14/19  6:18 AM  Result Value Ref Range   Phosphorus 4.6 2.5 - 4.6 mg/dL  Lipid panel     Status: Abnormal   Collection Time: 07/14/19  6:23 AM  Result Value Ref Range   Cholesterol 212 (H) 0 - 200 mg/dL   Triglycerides 37 <150 mg/dL   HDL 77 >40 mg/dL   Total CHOL/HDL Ratio 2.8 RATIO   VLDL 7 0 - 40 mg/dL   LDL Cholesterol 128 (H) 0 - 99 mg/dL     DG Chest 2 View  Addendum Date: 07/14/2019   ADDENDUM REPORT: 07/14/2019 11:21 ADDENDUM: These results were called by telephone at the time of interpretation on 07/14/2019 at 11:21 am to provider TODD MCDIARMID , who verbally acknowledged these results. Electronically Signed   By: Zetta Bills M.D.   On: 07/14/2019 11:21   Result Date: 07/14/2019 CLINICAL DATA:  Abdominal pain, cough history of hypertension EXAM: CHEST - 2 VIEW COMPARISON:  05/01/2010 FINDINGS: Film is rotated markedly to the right, accounting for this cardiomediastinal contours are stable. Dense basilar consolidation suggested at right lung base silhouetting portion of medial right hemidiaphragm on this projection. No signs of additional consolidative change though study limited by rotation. No acute bone process. IMPRESSION: Dense basilar consolidation suggested at the right lung base silhouetting the medial right hemidiaphragm. Based on appearance findings could be due to central obstructing pulmonary lesion  and/or pneumonia. Suggest chest CT for further  evaluation. A call is out to the referring provider to further discuss findings in the above case. Electronically Signed: By: Zetta Bills M.D. On: 07/14/2019 11:01   CT CHEST W CONTRAST  Result Date: 07/14/2019 CLINICAL DATA:  69 year old with abdominal pain. Airspace disease and consolidation in right lung on recent abdominal CT. EXAM: CT CHEST WITH CONTRAST TECHNIQUE: Multidetector CT imaging of the chest was performed during intravenous contrast administration. CONTRAST:  139mL OMNIPAQUE IOHEXOL 300 MG/ML  SOLN COMPARISON:  Abdominal CT 07/14/2019.  Chest CT 05/02/2010 FINDINGS: Cardiovascular: Coronary artery calcifications. Normal caliber of the thoracic aorta. No evidence for an aortic dissection. Great vessels patent. Celiac trunk, SMA and right renal artery are patent. Atherosclerotic disease in the proximal abdominal aorta and at the aortic arch. Mediastinum/Nodes: Borderline enlarged mediastinal lymph nodes. Lymph node in the pretracheal region measures 1.1 cm in short axis on sequence 2, image 52. No definite hilar lymphadenopathy. No axillary or supraclavicular lymphadenopathy. The visualized thyroid tissue is unremarkable. No gross abnormality to the esophagus. Lungs/Pleura: Trachea and mainstem bronchi are patent. Volume loss in the right hemithorax with mediastinal shift towards the right. There is collapse or obstruction in the right lower lobe bronchus just beyond the origin. Right middle lobe bronchus is patent but there is distal consolidation or volume loss in right middle lobe. Underlying centrilobular emphysema most conspicuous in the right upper lobe. Left lung is clear. Minimal volume loss at the left lung base. Upper Abdomen: Diffusely low attenuation of the liver is compatible with steatosis. Again noted are inflammatory changes and edema centered around the pancreas. Common bile duct is slightly prominent measuring 1.0 cm. No significant intrahepatic biliary dilatation. Normal  appearance of the stomach. There is extraluminal gas in the left upper abdomen. In retrospect, there was irregular gas in this region on the recent abdominal CT. The extraluminal gas appears to be confined within the mesocolon surrounding the sigmoid colon and splenic flexure. Gas does not clearly represent pneumatosis. Sigmoid colon extends up to the left upper abdomen and sigmoid colon is dilated with gas without wall thickening. No evidence for free intraperitoneal air on the right side of the abdomen. Musculoskeletal: Old right posterior eleventh rib fracture. IMPRESSION: 1. Extraluminal gas in left upper abdomen centered around the sigmoid colon and splenic flexure area. Sigmoid colon is distended with gas in this area. This extraluminal gas appears to be confined to the pericolonic tissue or mesocolon. Findings could be related to a bowel perforation but indeterminate based on the recent abdominal CT. 2. Complete collapse of the right lower lobe. Etiology for the collapse is unknown. An obstructing endobronchial lesion cannot be excluded. Consider further characterization with bronchoscopy. Small amount of volume loss in the right middle lobe. 3. Edema and inflammatory changes in the upper abdomen centered around the pancreas. Findings are suggestive for pancreatitis. Distal common bile duct is prominent measuring up to 1.0 cm and cannot exclude a distal biliary obstruction. 4. Hepatic steatosis. 5. Coronary artery calcifications. These results were called by telephone at the time of interpretation on 07/14/2019 at 1:42 pm to provider Nuala Alpha , who verbally acknowledged these results. Electronically Signed   By: Markus Daft M.D.   On: 07/14/2019 13:43   CT ABDOMEN PELVIS W CONTRAST  Result Date: 07/14/2019 CLINICAL DATA:  Lower abdominal pain nausea vomiting EXAM: CT ABDOMEN AND PELVIS WITH CONTRAST TECHNIQUE: Multidetector CT imaging of the abdomen and pelvis was performed using the standard  protocol  following bolus administration of intravenous contrast. CONTRAST:  161mL OMNIPAQUE IOHEXOL 300 MG/ML  SOLN COMPARISON:  CT chest May 02, 2010 FINDINGS: Lower chest: The visualized heart size within normal limits. No pericardial fluid/thickening. No hiatal hernia. There is patchy airspace consolidation in the posterior right lung base with small air bronchograms. Hepatobiliary: There is diffuse low density seen throughout the liver parenchyma.The main portal vein is patent. No evidence of calcified gallstones, gallbladder wall thickening. There appears to be mild dilation of the common bile duct at the level of the ampulla measuring up to 1 cm. However no calcified stones are seen. Pancreas: There is significant mesenteric fat stranding changes seen surrounding the entirety of the pancreas, predominantly around the proximal and mid body. No loculated fluid collections are seen. The SMV is patent. Normal pancreatic enhancement seen throughout. Spleen: Normal in size without focal abnormality. Adrenals/Urinary Tract: Both adrenal glands appear normal. The kidneys and collecting system appear normal without evidence of urinary tract calculus or hydronephrosis. Bladder is unremarkable. Stomach/Bowel: The stomach is normal in appearance. There appears to be mild wall thickening seen surrounding the first and second portion of the duodenum. The remainder of the small bowel and colon are normal in size and contour.The appendix is normal. Vascular/Lymphatic: There are no enlarged mesenteric, retroperitoneal, or pelvic lymph nodes. Scattered aortic atherosclerotic calcifications are seen without aneurysmal dilatation. Reproductive: The uterus and adnexa are unremarkable. Other: No evidence of abdominal wall mass or hernia. Musculoskeletal: No acute or significant osseous findings. IMPRESSION: 1. Findings consistent with acute pancreatitis with diffuse inflammatory changes, however predominantly around the proximal and  mid body. No evidence of pancreatic necrosis or pseudocyst. 2. Mildly dilated distal common bile duct at the level of the ampulla, however no calcified stones are seen. 3. Hepatic steatosis 4. Inflammatory changes surrounding the first and second portion of the duodenum. 5. Patchy airspace consolidation with air bronchogram seen at the posterior right lung base which could be due to atelectasis and/or infectious etiology. 6.  Aortic Atherosclerosis (ICD10-I70.0). Electronically Signed   By: Prudencio Pair M.D.   On: 07/14/2019 03:17   MR ABDOMEN MRCP W WO CONTAST  Result Date: 07/14/2019 CLINICAL DATA:  Acute pancreatitis EXAM: MRI ABDOMEN WITHOUT AND WITH CONTRAST (INCLUDING MRCP) TECHNIQUE: Multiplanar multisequence MR imaging of the abdomen was performed both before and after the administration of intravenous contrast. Heavily T2-weighted images of the biliary and pancreatic ducts were obtained, and three-dimensional MRCP images were rendered by post processing. CONTRAST:  59mL GADAVIST GADOBUTROL 1 MMOL/ML IV SOLN COMPARISON:  CT abdomen/pelvis dated 07/14/2019 FINDINGS: Lower chest: Right lower lobe atelectasis/collapse, better evaluated on recent CT. Hepatobiliary: Moderate hepatic steatosis. No suspicious/enhancing hepatic lesions. Gallbladder is unremarkable. No cholelithiasis is seen. No intrahepatic ductal dilatation. Common duct measures 6 mm and tapers at the ampulla. No choledocholithiasis is seen. Pancreas: Peripancreatic inflammatory changes, reflecting acute pancreatitis. No pancreatic mass or atrophy. No pancreatic necrosis or hemorrhage. No well-defined peripancreatic fluid collection/pseudocyst. Spleen:  Within normal limits. Adrenals/Urinary Tract:  Adrenal glands are within normal limits. Kidneys are within normal limits. No hydronephrosis. Stomach/Bowel: Stomach is within normal limits. Wall thickening with inflammatory changes involving the 2nd/3rd portion of the duodenum (series 5/image 15),  suggesting secondary duodenitis. Localized pericolonic mesenteric gas/pneumatosis in the left upper abdomen involving a loop of left colon is not well visualized on MR. Vascular/Lymphatic:  No evidence of aneurysm. No suspicious abdominal lymphadenopathy. Other: No abdominal ascites. Musculoskeletal: No focal osseous lesions. IMPRESSION: Acute pancreatitis, without  pancreatic necrosis or hemorrhage. No peripancreatic fluid collection/pseudocyst. Secondary wall thickening involving the duodenum, suggesting duodenitis. No intrahepatic or extrahepatic ductal dilatation. No cholelithiasis or choledocholithiasis is seen. Electronically Signed   By: Julian Hy M.D.   On: 07/14/2019 15:13    ROS:  As stated above in the HPI otherwise negative.  Blood pressure (!) 171/86, pulse (!) 101, temperature 98.8 F (37.1 C), temperature source Oral, resp. rate 19, height 5\' 2"  (1.575 m), weight 62 kg, SpO2 96 %.    PE: Gen: NAD, Alert and Oriented HEENT:  [place]/AT, EOMI Neck: Supple, no LAD Lungs: CTA Bilaterally CV: RRR without M/G/R ABM: Soft, NTND, +BS Ext: No C/C/E  Assessment/Plan: 1) Acute pancreatitis. 2) Extra-luminal air around the sigmoid colon. 3) ETOH abuse.   The clinical history and the initial findings of the CT scan suggest choledocholithiasis, but there was no evidence of these findings with the MRCP.  Her pain also did not rapidly improve to suggest passage of a stone.  The only other likely possibility is ETOH abuse, but she does not have a prior history of pancreatitis with her long term beer consumption.  Regardless, she is stable.  As for the extra-luminal gas, the source of unknown.  She was evaluated by Dr. Donne Hazel and there is currently no surgical indication.  Plan: 1) IV hydration - change to LR at 150 ml/hour. 2) Pain control. 3) Advance diet as tolerated.  Sharron Petruska D 07/14/2019, 4:56 PM

## 2019-07-14 NOTE — Consult Note (Signed)
Reason for Consult:abd pain, abnormal ct scan Referring Physician: [name] is an 69 y.o. female.  HPI: Natasha Holland who drinks a six pack of beer per day presents with acute onset of abdominal pain and emesis yesterday. Feels better now. No more nausea.  Not eating as much.  Pain is actually better right now. No fevers.  Nothing was making better at home and Natasha Holland came in.  Natasha Holland has never had this before.  Natasha Holland underwent multiple scans and mrcp and I was called to see her.    Past Medical History:  Diagnosis Date  . Hypertension   . Polycythemia vera(238.4)     History reviewed. No pertinent surgical history.  History reviewed. No pertinent family history.  Social History:  reports that Natasha Holland has quit smoking. Her smoking use included cigarettes. Natasha Holland smoked 1.00 pack per day. Natasha Holland has never used smokeless tobacco. Natasha Holland reports current alcohol use of about 7.0 standard drinks of alcohol per week. Natasha Holland reports that Natasha Holland does not use drugs.  Allergies: No Known Allergies  Medications: I have reviewed the patient's current medications.  Results for orders placed or performed during the hospital encounter of 07/13/19 (from the past 48 hour(s))  Lipase, blood     Status: Abnormal   Collection Time: 07/14/19  1:38 AM  Result Value Ref Range   Lipase 979 (H) 11 - 51 U/L    Comment: RESULTS CONFIRMED BY MANUAL DILUTION Performed at [facility], [address], [place], [place] [address]   Comprehensive metabolic panel     Status: Abnormal   Collection Time: 07/14/19  1:38 AM  Result Value Ref Range   Sodium 133 (L) 135 - 145 mmol/L   Potassium 4.5 3.5 - 5.1 mmol/L   Chloride 98 98 - 111 mmol/L   CO2 20 (L) 22 - 32 mmol/L   Glucose, Bld 116 (H) 70 - 99 mg/dL   BUN 7 (L) 8 - 23 mg/dL   Creatinine, Ser 0.58 0.44 - 1.00 mg/dL   Calcium 8.7 (L) 8.9 - 10.3 mg/dL   Total Protein 7.5 6.5 - 8.1 g/dL   Albumin 3.2 (L) 3.5 - 5.0 g/dL   AST 168 (H) 15 - 41 U/L   ALT 65 (H) 0 - 44 U/L    Alkaline Phosphatase 88 38 - 126 U/L   Total Bilirubin 1.1 0.3 - 1.2 mg/dL   GFR calc non Af Amer >60 >60 mL/min   GFR calc Af Amer >60 >60 mL/min   Anion gap 15 5 - 15    Comment: Performed at [facility], [place] [address], [place], [place] [address]  CBC     Status: Abnormal   Collection Time: 07/14/19  1:38 AM  Result Value Ref Range   WBC 5.8 4.0 - 10.5 K/uL   RBC 5.20 (H) 3.87 - 5.11 MIL/uL   Hemoglobin 16.5 (H) 12.0 - 15.0 g/dL   HCT 51.1 (H) 36.0 - 46.0 %   MCV 98.3 80.0 - 100.0 fL   MCH 31.7 26.0 - 34.0 pg   MCHC 32.3 30.0 - 36.0 g/dL   RDW 13.4 11.5 - 15.5 %   Platelets 101 (L) 150 - 400 K/uL    Comment: REPEATED TO VERIFY PLATELET COUNT CONFIRMED BY SMEAR SPECIMEN CHECKED FOR CLOTS Immature Platelet Fraction may be clinically indicated, consider ordering this additional test [id number]    nRBC 0.0 0.0 - 0.2 %    Comment: Performed at [facility], [place] [address], [place], [place] [address]  SARS CORONAVIRUS 2 (TAT 6-24 HRS) Nasopharyngeal Nasopharyngeal Swab     Status: None   Collection Time: 07/14/19  5:35 AM   Specimen: Nasopharyngeal Swab  Result Value Ref Range   SARS Coronavirus 2 NEGATIVE NEGATIVE    Comment: (NOTE) SARS-CoV-2 target nucleic acids are NOT DETECTED. The SARS-CoV-2 RNA is generally detectable in upper and lower respiratory specimens during the acute phase of infection. Negative results do not preclude SARS-CoV-2 infection, do not rule out co-infections with other pathogens, and should not be used as the sole basis for treatment or other patient management decisions. Negative results must be combined with clinical observations, patient history, and epidemiological information. The expected result is Negative. Fact Sheet for Patients: SugarRoll.be Fact Sheet for Healthcare Providers: https://www.woods-mathews.com/ This test is not yet approved or cleared by the [place] FDA and  has  been authorized for detection and/or diagnosis of SARS-CoV-2 by FDA under an Emergency Use Authorization (EUA). This EUA will remain  in effect (meaning this test can be used) for the duration of the COVID-19 declaration under Section 56 4(b)(1) of the Act, 21 U.S.C. section 360bbb-3(b)(1), unless the authorization is terminated or revoked sooner. Performed at [facility], [place] [address], [place], [place] [address]   HIV Antibody (routine testing w rflx)     Status: None   Collection Time: 07/14/19  6:18 AM  Result Value Ref Range   HIV Screen 4th Generation wRfx NON REACTIVE NON REACTIVE    Comment: Performed at [facility], [place] [address] [place], [place] [address]  CBC     Status: Abnormal   Collection Time: 07/14/19  6:18 AM  Result Value Ref Range   WBC 7.2 4.0 - 10.5 K/uL   RBC 4.72 3.87 - 5.11 MIL/uL   Hemoglobin 15.1 (H) 12.0 - 15.0 g/dL   HCT 45.5 36.0 - 46.0 %   MCV 96.4 80.0 - 100.0 fL   MCH 32.0 26.0 - 34.0 pg   MCHC 33.2 30.0 - 36.0 g/dL   RDW 13.3 11.5 - 15.5 %   Platelets 124 (L) 150 - 400 K/uL   nRBC 0.0 0.0 - 0.2 %    Comment: Performed at [facility], [place] [address], [place], [place] [address]  Creatinine, serum     Status: None   Collection Time: 07/14/19  6:18 AM  Result Value Ref Range   Creatinine, Ser 0.74 0.44 - 1.00 mg/dL   GFR calc non Af Amer >60 >60 mL/min   GFR calc Af Amer >60 >60 mL/min    Comment: Performed at [facility] [address] [place], [place] [address]  Magnesium     Status: None   Collection Time: 07/14/19  6:18 AM  Result Value Ref Range   Magnesium 1.7 1.7 - 2.4 mg/dL    Comment: Performed at [facility] [address], [place], [place] [address]  Phosphorus     Status: None   Collection Time: 07/14/19  6:18 AM  Result Value Ref Range   Phosphorus 4.6 2.5 - 4.6 mg/dL    Comment: Performed at [facility] [address], [place], [place] [address]  Lipid panel     Status: Abnormal    Collection Time: 07/14/19  6:23 AM  Result Value Ref Range   Cholesterol 212 (H) 0 - 200 mg/dL   Triglycerides 37 <150 mg/dL   HDL  77 >40 mg/dL   Total CHOL/HDL Ratio 2.8 RATIO   VLDL 7 0 - 40 mg/dL   LDL Cholesterol 128 (H) 0 - 99 mg/dL    Comment:        Total Cholesterol/HDL:CHD Risk Coronary Heart Disease Risk Table                     Men   Women  1/2 Average Risk   3.4   3.3  Average Risk       5.0   4.4  2 X Average Risk   9.6   7.1  3 X Average Risk  23.4   11.0        Use the calculated Patient Ratio above and the CHD Risk Table to determine the patient's CHD Risk.        ATP III CLASSIFICATION (LDL):  <100     mg/dL   Optimal  100-129  mg/dL   Near or Above                    Optimal  130-159  mg/dL   Borderline  160-189  mg/dL   High  >190     mg/dL   Very High Performed at [facility] [address], [place], [place] [address]     DG Chest 2 View  Addendum Date: 07/14/2019   ADDENDUM REPORT: 07/14/2019 11:21 ADDENDUM: These results were called by telephone at the time of interpretation on 07/14/2019 at 11:21 am to provider TODD MCDIARMID , who verbally acknowledged these results. Electronically Signed   By: Zetta Bills M.D.   On: 07/14/2019 11:21   Result Date: 07/14/2019 CLINICAL DATA:  Abdominal pain, cough history of hypertension EXAM: CHEST - 2 VIEW COMPARISON:  05/01/2010 FINDINGS: Film is rotated markedly to the right, accounting for this cardiomediastinal contours are stable. Dense basilar consolidation suggested at right lung base silhouetting portion of medial right hemidiaphragm on this projection. No signs of additional consolidative change though study limited by rotation. No acute bone process. IMPRESSION: Dense basilar consolidation suggested at the right lung base silhouetting the medial right hemidiaphragm. Based on appearance findings could be due to central obstructing pulmonary lesion and/or pneumonia. Suggest chest CT for further  evaluation. A call is out to the referring provider to further discuss findings in the above case. Electronically Signed: By: Zetta Bills M.D. On: 07/14/2019 11:01   CT CHEST W CONTRAST  Result Date: 07/14/2019 CLINICAL DATA:  69 year old with abdominal pain. Airspace disease and consolidation in right lung on recent abdominal CT. EXAM: CT CHEST WITH CONTRAST TECHNIQUE: Multidetector CT imaging of the chest was performed during intravenous contrast administration. CONTRAST:  152mL OMNIPAQUE IOHEXOL 300 MG/ML  SOLN COMPARISON:  Abdominal CT 07/14/2019.  Chest CT 05/02/2010 FINDINGS: Cardiovascular: Coronary artery calcifications. Normal caliber of the thoracic aorta. No evidence for an aortic dissection. Great vessels patent. Celiac trunk, SMA and right renal artery are patent. Atherosclerotic disease in the proximal abdominal aorta and at the aortic arch. Mediastinum/Nodes: Borderline enlarged mediastinal lymph nodes. Lymph node in the pretracheal region measures 1.1 cm in short axis on sequence 2, image 52. No definite hilar lymphadenopathy. No axillary or supraclavicular lymphadenopathy. The visualized thyroid tissue is unremarkable. No gross abnormality to the esophagus. Lungs/Pleura: Trachea and mainstem bronchi are patent. Volume loss in the right hemithorax with mediastinal shift towards the right. There is collapse or obstruction in the right lower lobe bronchus just beyond the origin.  Right middle lobe bronchus is patent but there is distal consolidation or volume loss in right middle lobe. Underlying centrilobular emphysema most conspicuous in the right upper lobe. Left lung is clear. Minimal volume loss at the left lung base. Upper Abdomen: Diffusely low attenuation of the liver is compatible with steatosis. Again noted are inflammatory changes and edema centered around the pancreas. Common bile duct is slightly prominent measuring 1.0 cm. No significant intrahepatic biliary dilatation. Normal  appearance of the stomach. There is extraluminal gas in the left upper abdomen. In retrospect, there was irregular gas in this region on the recent abdominal CT. The extraluminal gas appears to be confined within the mesocolon surrounding the sigmoid colon and splenic flexure. Gas does not clearly represent pneumatosis. Sigmoid colon extends up to the left upper abdomen and sigmoid colon is dilated with gas without wall thickening. No evidence for free intraperitoneal air on the right side of the abdomen. Musculoskeletal: Old right posterior eleventh rib fracture. IMPRESSION: 1. Extraluminal gas in left upper abdomen centered around the sigmoid colon and splenic flexure area. Sigmoid colon is distended with gas in this area. This extraluminal gas appears to be confined to the pericolonic tissue or mesocolon. Findings could be related to a bowel perforation but indeterminate based on the recent abdominal CT. 2. Complete collapse of the right lower lobe. Etiology for the collapse is unknown. An obstructing endobronchial lesion cannot be excluded. Consider further characterization with bronchoscopy. Small amount of volume loss in the right middle lobe. 3. Edema and inflammatory changes in the upper abdomen centered around the pancreas. Findings are suggestive for pancreatitis. Distal common bile duct is prominent measuring up to 1.0 cm and cannot exclude a distal biliary obstruction. 4. Hepatic steatosis. 5. Coronary artery calcifications. These results were called by telephone at the time of interpretation on 07/14/2019 at 1:42 pm to provider Nuala Alpha , who verbally acknowledged these results. Electronically Signed   By: Markus Daft M.D.   On: 07/14/2019 13:43   CT ABDOMEN PELVIS W CONTRAST  Result Date: 07/14/2019 CLINICAL DATA:  Lower abdominal pain nausea vomiting EXAM: CT ABDOMEN AND PELVIS WITH CONTRAST TECHNIQUE: Multidetector CT imaging of the abdomen and pelvis was performed using the standard protocol  following bolus administration of intravenous contrast. CONTRAST:  126mL OMNIPAQUE IOHEXOL 300 MG/ML  SOLN COMPARISON:  CT chest May 02, 2010 FINDINGS: Lower chest: The visualized heart size within normal limits. No pericardial fluid/thickening. No hiatal hernia. There is patchy airspace consolidation in the posterior right lung base with small air bronchograms. Hepatobiliary: There is diffuse low density seen throughout the liver parenchyma.The main portal vein is patent. No evidence of calcified gallstones, gallbladder wall thickening. There appears to be mild dilation of the common bile duct at the level of the ampulla measuring up to 1 cm. However no calcified stones are seen. Pancreas: There is significant mesenteric fat stranding changes seen surrounding the entirety of the pancreas, predominantly around the proximal and mid body. No loculated fluid collections are seen. The SMV is patent. Normal pancreatic enhancement seen throughout. Spleen: Normal in size without focal abnormality. Adrenals/Urinary Tract: Both adrenal glands appear normal. The kidneys and collecting system appear normal without evidence of urinary tract calculus or hydronephrosis. Bladder is unremarkable. Stomach/Bowel: The stomach is normal in appearance. There appears to be mild wall thickening seen surrounding the first and second portion of the duodenum. The remainder of the small bowel and colon are normal in size and contour.The appendix is normal. Vascular/Lymphatic:  There are no enlarged mesenteric, retroperitoneal, or pelvic lymph nodes. Scattered aortic atherosclerotic calcifications are seen without aneurysmal dilatation. Reproductive: The uterus and adnexa are unremarkable. Other: No evidence of abdominal wall mass or hernia. Musculoskeletal: No acute or significant osseous findings. IMPRESSION: 1. Findings consistent with acute pancreatitis with diffuse inflammatory changes, however predominantly around the proximal and  mid body. No evidence of pancreatic necrosis or pseudocyst. 2. Mildly dilated distal common bile duct at the level of the ampulla, however no calcified stones are seen. 3. Hepatic steatosis 4. Inflammatory changes surrounding the first and second portion of the duodenum. 5. Patchy airspace consolidation with air bronchogram seen at the posterior right lung base which could be due to atelectasis and/or infectious etiology. 6.  Aortic Atherosclerosis (ICD10-I70.0). Electronically Signed   By: Prudencio Pair M.D.   On: 07/14/2019 03:17   MR ABDOMEN MRCP W WO CONTAST  Result Date: 07/14/2019 CLINICAL DATA:  Acute pancreatitis EXAM: MRI ABDOMEN WITHOUT AND WITH CONTRAST (INCLUDING MRCP) TECHNIQUE: Multiplanar multisequence MR imaging of the abdomen was performed both before and after the administration of intravenous contrast. Heavily T2-weighted images of the biliary and pancreatic ducts were obtained, and three-dimensional MRCP images were rendered by post processing. CONTRAST:  70mL GADAVIST GADOBUTROL 1 MMOL/ML IV SOLN COMPARISON:  CT abdomen/pelvis dated 07/14/2019 FINDINGS: Lower chest: Right lower lobe atelectasis/collapse, better evaluated on recent CT. Hepatobiliary: Moderate hepatic steatosis. No suspicious/enhancing hepatic lesions. Gallbladder is unremarkable. No cholelithiasis is seen. No intrahepatic ductal dilatation. Common duct measures 6 mm and tapers at the ampulla. No choledocholithiasis is seen. Pancreas: Peripancreatic inflammatory changes, reflecting acute pancreatitis. No pancreatic mass or atrophy. No pancreatic necrosis or hemorrhage. No well-defined peripancreatic fluid collection/pseudocyst. Spleen:  Within normal limits. Adrenals/Urinary Tract:  Adrenal glands are within normal limits. Kidneys are within normal limits. No hydronephrosis. Stomach/Bowel: Stomach is within normal limits. Wall thickening with inflammatory changes involving the 2nd/3rd portion of the duodenum (series 5/image 15),  suggesting secondary duodenitis. Localized pericolonic mesenteric gas/pneumatosis in the left upper abdomen involving a loop of left colon is not well visualized on MR. Vascular/Lymphatic:  No evidence of aneurysm. No suspicious abdominal lymphadenopathy. Other: No abdominal ascites. Musculoskeletal: No focal osseous lesions. IMPRESSION: Acute pancreatitis, without pancreatic necrosis or hemorrhage. No peripancreatic fluid collection/pseudocyst. Secondary wall thickening involving the duodenum, suggesting duodenitis. No intrahepatic or extrahepatic ductal dilatation. No cholelithiasis or choledocholithiasis is seen. Electronically Signed   By: Julian Hy M.D.   On: 07/14/2019 15:13    Review of Systems  Gastrointestinal: Positive for abdominal distention, abdominal pain, nausea and vomiting.  Genitourinary: Negative for decreased urine volume.  All other systems reviewed and are negative.  Blood pressure (!) 171/86, pulse (!) 101, temperature 98.8 F (37.1 C), temperature source Oral, resp. rate 19, height 5\' 2"  (1.575 m), weight 62 kg, SpO2 96 %. Physical Exam  Vitals reviewed. Constitutional: Natasha Holland is oriented to person, place, and time. Natasha Holland appears well-developed and well-nourished.  HENT:  Right Ear: External ear normal.  Left Ear: External ear normal.  Mouth/Throat: Oropharynx is clear and moist.  Eyes: Pupils are equal, round, and reactive to light. No scleral icterus.  Cardiovascular: Normal rate, regular rhythm and normal heart sounds.  Respiratory: Effort normal and breath sounds normal.  GI: Soft. There is abdominal tenderness (epigastric and ruq mildly tender to deep palpation, no luq tenderness).  Musculoskeletal:        General: No tenderness or deformity.     Cervical back: Neck supple.  Lymphadenopathy:  Natasha Holland has no cervical adenopathy.  Neurological: Natasha Holland is alert and oriented to person, place, and time. Natasha Holland has normal strength. GCS eye subscore is 4. GCS verbal  subscore is 5. GCS motor subscore is 6.  Skin: Skin is warm and dry.  Psychiatric: Natasha Holland has a normal mood and affect. Her behavior is normal.    Assessment/Plan: Pancreatitis ? Extraluminal air -her exam is benign even with pancreatitis, not sure what this is on ct, vitals are normal -I think npo and serial exams reasonable for her, no need for surgery at this point   Rolm Bookbinder 07/14/2019, 4:06 PM

## 2019-07-14 NOTE — Progress Notes (Signed)
Pharmacy Antibiotic Note  Natasha Holland is a 69 y.o. female admitted on 07/13/2019 with acute pancreatitis and RLL collapse.  Pharmacy has been consulted for Unasyn dosing for aspiration pneumonia.  WBC 7.2, afebrile; Scr 0.74, CrCl 57.5 ml/min (renal function stable)  Plan: Unasyn 3 gm IV Q 6 hrs Monitor WBC, temp, renal function, clinical improvement, length of therapy  Height: 5\' 2"  (157.5 cm) Weight: 136 lb 11 oz (62 kg) IBW/kg (Calculated) : 50.1  Temp (24hrs), Avg:98.2 F (36.8 C), Min:97.6 F (36.4 C), Max:98.8 F (37.1 C)  Recent Labs  Lab 07/14/19 0138 07/14/19 0618  WBC 5.8 7.2  CREATININE 0.58 0.74    Estimated Creatinine Clearance: 57.5 mL/min (by C-G formula based on SCr of 0.74 mg/dL).    No Known Allergies  Microbiology results: 2/9 COVID: negative 2/9 HIV: negative  Thank you for allowing pharmacy to be a part of this patient's care.  Gillermina Hu, PharmD, BCPS, [facility] Clinical Pharmacist 07/14/2019 6:26 PM

## 2019-07-15 ENCOUNTER — Encounter (HOSPITAL_COMMUNITY)
Admission: EM | Disposition: A | Discharge status: Home / Self Care | Payer: Self-pay | Source: Home / Self Care | Attending: Family Medicine

## 2019-07-15 DIAGNOSIS — R918 Other nonspecific abnormal finding of lung field: Secondary | ICD-10-CM

## 2019-07-15 LAB — CBC
HCT: 43.7 % (ref 36.0–46.0)
Hemoglobin: 14.4 g/dL (ref 12.0–15.0)
MCH: 31.6 pg (ref 26.0–34.0)
MCHC: 33 g/dL (ref 30.0–36.0)
MCV: 95.8 fL (ref 80.0–100.0)
Platelets: 106 10*3/uL — ABNORMAL LOW (ref 150–400)
RBC: 4.56 MIL/uL (ref 3.87–5.11)
RDW: 13.2 % (ref 11.5–15.5)
WBC: 7.4 10*3/uL (ref 4.0–10.5)
nRBC: 0 % (ref 0.0–0.2)

## 2019-07-15 LAB — COMPREHENSIVE METABOLIC PANEL
ALT: 39 U/L (ref 0–44)
AST: 64 U/L — ABNORMAL HIGH (ref 15–41)
Albumin: 2.6 g/dL — ABNORMAL LOW (ref 3.5–5.0)
Alkaline Phosphatase: 69 U/L (ref 38–126)
Anion gap: 8 (ref 5–15)
BUN: <5 mg/dL — ABNORMAL LOW (ref 8–23)
CO2: 28 mmol/L (ref 22–32)
Calcium: 8.5 mg/dL — ABNORMAL LOW (ref 8.9–10.3)
Chloride: 97 mmol/L — ABNORMAL LOW (ref 98–111)
Creatinine, Ser: 0.55 mg/dL (ref 0.44–1.00)
GFR calc Af Amer: >60 mL/min (ref >60)
GFR calc non Af Amer: >60 mL/min (ref >60)
Glucose, Bld: 97 mg/dL (ref 70–99)
Potassium: 3.9 mmol/L (ref 3.5–5.1)
Sodium: 133 mmol/L — ABNORMAL LOW (ref 135–145)
Total Bilirubin: 1.3 mg/dL — ABNORMAL HIGH (ref 0.3–1.2)
Total Protein: 6.4 g/dL — ABNORMAL LOW (ref 6.5–8.1)

## 2019-07-15 LAB — MAGNESIUM: Magnesium: 1.8 mg/dL (ref 1.7–2.4)

## 2019-07-15 LAB — PROCALCITONIN: Procalcitonin: 0.14 ng/mL

## 2019-07-15 SURGERY — ERCP, WITH INTERVENTION IF INDICATED
Anesthesia: General

## 2019-07-15 MED ORDER — AMLODIPINE BESYLATE 5 MG PO TABS
5.0000 mg | ORAL_TABLET | Freq: Every day | ORAL | Status: DC
  Administered 2019-07-15 – 2019-07-16 (×2): 5 mg via ORAL
  Filled 2019-07-15 (×2): qty 1

## 2019-07-15 NOTE — Progress Notes (Signed)
NAME:  Natasha Holland, MRN:  [id number], DOB:  02/15/1951, LOS: 1 ADMISSION DATE:  07/13/2019, CONSULTATION DATE:  2/9 REFERRING MD:  Teaching service, CHIEF COMPLAINT:  RLL collapase   Brief History   69 year old female with ETOH history admitted for acute pancreatitis. Incidentally found to have RLL collapse on CXR and PCCM consulted.     Past Medical History    has a past medical history of Hypertension and Polycythemia vera(238.4).  Significant Hospital Events   07/13/2019  - admit  Consults:  PCCM CCS  Procedures:    Significant Diagnostic Tests:  CT chest/abd/pel 2/9 > Extraluminal gas in left upper abdomen centered around the sigmoid colon and splenic flexure area. Sigmoid colon is distended with gas in this area. Complete collapse of the right lower lobe. Etiology for the collapse is unknown. An obstructing endobronchial lesion cannot be excluded. Consider further characterization with bronchoscopy. Small amount of volume loss in the right middle lobe.  MRCP 2/10 >  Micro Data:    Antimicrobials:  Unasyn 2/9 >   Interim history/subjective:  Feeling better today. No distress. 2L [place]  Objective   Blood pressure (!) 182/94, pulse 87, temperature 98.5 F (36.9 C), temperature source Oral, resp. rate 18, height 5\' 2"  (1.575 m), weight 62 kg, SpO2 90 %.        Intake/Output Summary (Last 24 hours) at 07/15/2019 1215 Last data filed at 07/15/2019 0900 Gross per 24 hour  Intake 2265.73 ml  Output --  Net 2265.73 ml   Filed Weights   07/14/19 1523  Weight: 62 kg    Examination:  General: frail elderly female resting comfortably in bed HENT: [place]/AT, PERRL, no JVD Lungs: Clear to auscultation bilaterally with good air entry.   Cardiovascular: Normal heart sounds Abdomen: Soft, non-tender, non-distended Extremities: No acute deformity or ROM limitation Neuro: Alert and oriented x3 speech normal  Resolved Hospital Problem list     Assessment & Plan:   Right  lower lobe collapse in the setting of baseline previous smoking, ongoing alcohol history of poor dentition. She has recently been vomiting in the setting of acute pancreatitis.   Most likely she has had an aspiration event and probably has mucous plugging, however, endobronchial lesion cannot be excluded. Procalcitonin is not negative, but it is low.   Plan -Check for urine Streptococcus and urine Legionella and serum procalcitonin -Culture sputum if she is able to produce sample.  -Unasyn is appropriate  -Aggressive pulmonary toilet -Would repeat imaging after course of antibiotics to evaluate if need for bronchoscopy.    Acute pancreatitis - per primary.  Best practice:  According to primary service Code Status: Full Family Communication: Patient updated about pulmonary plan Disposition: [id number]     LABS    PULMONARY No results for input(s): PHART, PCO2ART, PO2ART, HCO3, TCO2, O2SAT in the last 168 hours.  Invalid input(s): PCO2, PO2  CBC Recent Labs  Lab 07/14/19 0138 07/14/19 0618 07/15/19 0538  HGB 16.5* 15.1* 14.4  HCT 51.1* 45.5 43.7  WBC 5.8 7.2 7.4  PLT 101* 124* 106*    COAGULATION No results for input(s): INR in the last 168 hours.  CARDIAC  No results for input(s): TROPONINI in the last 168 hours. No results for input(s): PROBNP in the last 168 hours.   CHEMISTRY Recent Labs  Lab 07/14/19 0138 07/14/19 0618 07/15/19 0538 07/15/19 0558  NA 133*  --  133*  --   K 4.5  --  3.9  --  CL 98  --  97*  --   CO2 20*  --  28  --   GLUCOSE 116*  --  97  --   BUN 7*  --  <5*  --   CREATININE 0.58 0.74 0.55  --   CALCIUM 8.7*  --  8.5*  --   MG  --  1.7  --  1.8  PHOS  --  4.6  --   --    Estimated Creatinine Clearance: 57.5 mL/min (by C-G formula based on SCr of 0.55 mg/dL).   LIVER Recent Labs  Lab 07/14/19 0138 07/15/19 0538  AST 168* 64*  ALT 65* 39  ALKPHOS 88 69  BILITOT 1.1 1.3*  PROT 7.5 6.4*  ALBUMIN 3.2* 2.6*      INFECTIOUS Recent Labs  Lab 07/15/19 0538  PROCALCITON 0.14     ENDOCRINE CBG (last 3)  No results for input(s): GLUCAP in the last 72 hours.       IMAGING x48h  - image(s) personally visualized  -   highlighted in bold DG Chest 2 View  Addendum Date: 07/14/2019   ADDENDUM REPORT: 07/14/2019 11:21 ADDENDUM: These results were called by telephone at the time of interpretation on 07/14/2019 at 11:21 am to provider TODD MCDIARMID , who verbally acknowledged these results. Electronically Signed   By: Zetta Bills M.D.   On: 07/14/2019 11:21   Result Date: 07/14/2019 CLINICAL DATA:  Abdominal pain, cough history of hypertension EXAM: CHEST - 2 VIEW COMPARISON:  05/01/2010 FINDINGS: Film is rotated markedly to the right, accounting for this cardiomediastinal contours are stable. Dense basilar consolidation suggested at right lung base silhouetting portion of medial right hemidiaphragm on this projection. No signs of additional consolidative change though study limited by rotation. No acute bone process. IMPRESSION: Dense basilar consolidation suggested at the right lung base silhouetting the medial right hemidiaphragm. Based on appearance findings could be due to central obstructing pulmonary lesion and/or pneumonia. Suggest chest CT for further evaluation. A call is out to the referring provider to further discuss findings in the above case. Electronically Signed: By: Zetta Bills M.D. On: 07/14/2019 11:01   CT CHEST W CONTRAST  Result Date: 07/14/2019 CLINICAL DATA:  69 year old with abdominal pain. Airspace disease and consolidation in right lung on recent abdominal CT. EXAM: CT CHEST WITH CONTRAST TECHNIQUE: Multidetector CT imaging of the chest was performed during intravenous contrast administration. CONTRAST:  1109mL OMNIPAQUE IOHEXOL 300 MG/ML  SOLN COMPARISON:  Abdominal CT 07/14/2019.  Chest CT 05/02/2010 FINDINGS: Cardiovascular: Coronary artery calcifications. Normal caliber of  the thoracic aorta. No evidence for an aortic dissection. Great vessels patent. Celiac trunk, SMA and right renal artery are patent. Atherosclerotic disease in the proximal abdominal aorta and at the aortic arch. Mediastinum/Nodes: Borderline enlarged mediastinal lymph nodes. Lymph node in the pretracheal region measures 1.1 cm in short axis on sequence 2, image 52. No definite hilar lymphadenopathy. No axillary or supraclavicular lymphadenopathy. The visualized thyroid tissue is unremarkable. No gross abnormality to the esophagus. Lungs/Pleura: Trachea and mainstem bronchi are patent. Volume loss in the right hemithorax with mediastinal shift towards the right. There is collapse or obstruction in the right lower lobe bronchus just beyond the origin. Right middle lobe bronchus is patent but there is distal consolidation or volume loss in right middle lobe. Underlying centrilobular emphysema most conspicuous in the right upper lobe. Left lung is clear. Minimal volume loss at the left lung base. Upper Abdomen: Diffusely low attenuation of the  liver is compatible with steatosis. Again noted are inflammatory changes and edema centered around the pancreas. Common bile duct is slightly prominent measuring 1.0 cm. No significant intrahepatic biliary dilatation. Normal appearance of the stomach. There is extraluminal gas in the left upper abdomen. In retrospect, there was irregular gas in this region on the recent abdominal CT. The extraluminal gas appears to be confined within the mesocolon surrounding the sigmoid colon and splenic flexure. Gas does not clearly represent pneumatosis. Sigmoid colon extends up to the left upper abdomen and sigmoid colon is dilated with gas without wall thickening. No evidence for free intraperitoneal air on the right side of the abdomen. Musculoskeletal: Old right posterior eleventh rib fracture. IMPRESSION: 1. Extraluminal gas in left upper abdomen centered around the sigmoid colon and  splenic flexure area. Sigmoid colon is distended with gas in this area. This extraluminal gas appears to be confined to the pericolonic tissue or mesocolon. Findings could be related to a bowel perforation but indeterminate based on the recent abdominal CT. 2. Complete collapse of the right lower lobe. Etiology for the collapse is unknown. An obstructing endobronchial lesion cannot be excluded. Consider further characterization with bronchoscopy. Small amount of volume loss in the right middle lobe. 3. Edema and inflammatory changes in the upper abdomen centered around the pancreas. Findings are suggestive for pancreatitis. Distal common bile duct is prominent measuring up to 1.0 cm and cannot exclude a distal biliary obstruction. 4. Hepatic steatosis. 5. Coronary artery calcifications. These results were called by telephone at the time of interpretation on 07/14/2019 at 1:42 pm to provider Nuala Alpha , who verbally acknowledged these results. Electronically Signed   By: Markus Daft M.D.   On: 07/14/2019 13:43   CT ABDOMEN PELVIS W CONTRAST  Result Date: 07/14/2019 CLINICAL DATA:  Lower abdominal pain nausea vomiting EXAM: CT ABDOMEN AND PELVIS WITH CONTRAST TECHNIQUE: Multidetector CT imaging of the abdomen and pelvis was performed using the standard protocol following bolus administration of intravenous contrast. CONTRAST:  167mL OMNIPAQUE IOHEXOL 300 MG/ML  SOLN COMPARISON:  CT chest May 02, 2010 FINDINGS: Lower chest: The visualized heart size within normal limits. No pericardial fluid/thickening. No hiatal hernia. There is patchy airspace consolidation in the posterior right lung base with small air bronchograms. Hepatobiliary: There is diffuse low density seen throughout the liver parenchyma.The main portal vein is patent. No evidence of calcified gallstones, gallbladder wall thickening. There appears to be mild dilation of the common bile duct at the level of the ampulla measuring up to 1 cm.  However no calcified stones are seen. Pancreas: There is significant mesenteric fat stranding changes seen surrounding the entirety of the pancreas, predominantly around the proximal and mid body. No loculated fluid collections are seen. The SMV is patent. Normal pancreatic enhancement seen throughout. Spleen: Normal in size without focal abnormality. Adrenals/Urinary Tract: Both adrenal glands appear normal. The kidneys and collecting system appear normal without evidence of urinary tract calculus or hydronephrosis. Bladder is unremarkable. Stomach/Bowel: The stomach is normal in appearance. There appears to be mild wall thickening seen surrounding the first and second portion of the duodenum. The remainder of the small bowel and colon are normal in size and contour.The appendix is normal. Vascular/Lymphatic: There are no enlarged mesenteric, retroperitoneal, or pelvic lymph nodes. Scattered aortic atherosclerotic calcifications are seen without aneurysmal dilatation. Reproductive: The uterus and adnexa are unremarkable. Other: No evidence of abdominal wall mass or hernia. Musculoskeletal: No acute or significant osseous findings. IMPRESSION: 1. Findings consistent with  acute pancreatitis with diffuse inflammatory changes, however predominantly around the proximal and mid body. No evidence of pancreatic necrosis or pseudocyst. 2. Mildly dilated distal common bile duct at the level of the ampulla, however no calcified stones are seen. 3. Hepatic steatosis 4. Inflammatory changes surrounding the first and second portion of the duodenum. 5. Patchy airspace consolidation with air bronchogram seen at the posterior right lung base which could be due to atelectasis and/or infectious etiology. 6.  Aortic Atherosclerosis (ICD10-I70.0). Electronically Signed   By: Prudencio Pair M.D.   On: 07/14/2019 03:17   MR ABDOMEN MRCP W WO CONTAST  Result Date: 07/14/2019 CLINICAL DATA:  Acute pancreatitis EXAM: MRI ABDOMEN WITHOUT  AND WITH CONTRAST (INCLUDING MRCP) TECHNIQUE: Multiplanar multisequence MR imaging of the abdomen was performed both before and after the administration of intravenous contrast. Heavily T2-weighted images of the biliary and pancreatic ducts were obtained, and three-dimensional MRCP images were rendered by post processing. CONTRAST:  50mL GADAVIST GADOBUTROL 1 MMOL/ML IV SOLN COMPARISON:  CT abdomen/pelvis dated 07/14/2019 FINDINGS: Lower chest: Right lower lobe atelectasis/collapse, better evaluated on recent CT. Hepatobiliary: Moderate hepatic steatosis. No suspicious/enhancing hepatic lesions. Gallbladder is unremarkable. No cholelithiasis is seen. No intrahepatic ductal dilatation. Common duct measures 6 mm and tapers at the ampulla. No choledocholithiasis is seen. Pancreas: Peripancreatic inflammatory changes, reflecting acute pancreatitis. No pancreatic mass or atrophy. No pancreatic necrosis or hemorrhage. No well-defined peripancreatic fluid collection/pseudocyst. Spleen:  Within normal limits. Adrenals/Urinary Tract:  Adrenal glands are within normal limits. Kidneys are within normal limits. No hydronephrosis. Stomach/Bowel: Stomach is within normal limits. Wall thickening with inflammatory changes involving the 2nd/3rd portion of the duodenum (series 5/image 15), suggesting secondary duodenitis. Localized pericolonic mesenteric gas/pneumatosis in the left upper abdomen involving a loop of left colon is not well visualized on MR. Vascular/Lymphatic:  No evidence of aneurysm. No suspicious abdominal lymphadenopathy. Other: No abdominal ascites. Musculoskeletal: No focal osseous lesions. IMPRESSION: Acute pancreatitis, without pancreatic necrosis or hemorrhage. No peripancreatic fluid collection/pseudocyst. Secondary wall thickening involving the duodenum, suggesting duodenitis. No intrahepatic or extrahepatic ductal dilatation. No cholelithiasis or choledocholithiasis is seen. Electronically Signed   By:  Julian Hy M.D.   On: 07/14/2019 15:13

## 2019-07-15 NOTE — Progress Notes (Signed)
Family Medicine Teaching Service Daily Progress Note Intern Pager: [phone]  Patient name: Natasha Holland Medical record number: [id number] Date of birth: 11/16/1950 Age: 69 y.o. Gender: female  Primary Care Provider: Patient, No Pcp Per Consultants: None Code Status: DNI  Pt Overview and Major Events to Date:  02/09-admitted  Assessment and Plan: Natasha Holland is a 69 y.o. female presenting with abdominal pain. PMH is significant for HTN, hyperglycemia, and ETOH abuse.   Acute Pancreatitis likely secondary to ETOH Stable overnight.  Pain controlled with current medications.  No evidence of stone on MRCP per GI.  CT abdomen impressive for extraluminal air and obstructing endobronchial lesion.  Surgery and pulmonology consulted. Surgery recommends n.p.o. and serial exam but no surgery at this point.  Pulmonology evaluated and most likely patient has had an aspiration event and probable mucous plugging and may need to have flexible bronchoscopy for further evaluation.  Patient was started on Unasyn 3 g every 6 IV.  Covid negative.  Abdominal exam nontender, nondistended, soft, bowel sounds present -GI recommendations appreciated - IV hydration, LR at 172ms/hr, pain control and advance diet. -Surgery recommendations appreciated -Follow-up urine for Streptococcus and Legionella -Continue IV Unasyn 3 g every 6h (02/10-) -Serum calcitonin within normal limits -Check lactic acid -Oxycodone IR 5 mg every 4 as needed -OxyContin 15 mg twice daily -Tylenol 650 mg every 6 hours as needed -Vital signs per protocol -Continuous cardiac and pulse ox monitoring -Wean oxygen as tolerated -Pulmonary toileting  ETOH Abuse Last EtOH 02/08.  Last recorded CIWA 0. Patient hypertensive systolic blood pressure [id number].  Denies any headaches, hallucinations visual or auditory.  On exam patient pleasant, nontremulous, alert and orientated x3. -Continue folic acid, multivitamins and thiamine -CIWA  protocol -Notify MD if CIWA score greater than 5  HTN Systolic blood pressure [id number].  Patient reports was taking BP med but had stopped over a year and a half ago due to dizziness.  Denies any headaches, visual disturbances, chest pain or shortness of breath.  Reports good pain control with current regime for abdomen. -Start Norvasc 2.536mdaily  Hx of hyperglycemia HbA1c 4.9.   -Follow-up a.m. labs  GOC: does not currently have pcp and would llike to start seeing one regularly -consult TOC for pcp  FEN/GI:  -Clear liquids, advance diet as tolerated  Prophylaxis:  -Lovenox 40 mg subcu  Disposition: Home when medically stable  Subjective:  No acute events overnight.  Reports pain is much better and controlled with current regimen.  She is questing food.  Denies any chest pain, shortness of breath, headaches or visual disturbances.  Objective: Temp:  [98.8 F (37.1 C)] 98.8 F (37.1 C) (02/09 2218) Pulse Rate:  [82-112] 90 (02/09 2218) Resp:  [14-38] 16 (02/09 2218) BP: (157-187)/(80-104) 187/96 (02/09 2218) SpO2:  [88 %-97 %] 96 % (02/09 2218) Weight:  [62 kg] 62 kg (02/09 1523) Physical Exam: General: [age]ear old female in no acute distress Cardiovascular: Regular rate and rhythm, no murmurs or gallops appreciated Respiratory: Clear to auscultation bilaterally, diminished at bases no increased work of breathing abdomen: Soft, nontender, nondistended, bowel sounds present Extremities: No lower extremity edema  Laboratory: Recent Labs  Lab 07/14/19 0138 07/14/19 0618  WBC 5.8 7.2  HGB 16.5* 15.1*  HCT 51.1* 45.5  PLT 101* 124*   Recent Labs  Lab 07/14/19 0138 07/14/19 0618  NA 133*  --   K 4.5  --   CL 98  --   CO2 20*  --  BUN 7*  --   CREATININE 0.58 0.74  CALCIUM 8.7*  --   PROT 7.5  --   BILITOT 1.1  --   ALKPHOS 88  --   ALT 65*  --   AST 168*  --   GLUCOSE 116*  --       Imaging/Diagnostic Tests: DG Chest 2 View  Addendum  Date: 07/14/2019   ADDENDUM REPORT: 07/14/2019 11:21 ADDENDUM: These results were called by telephone at the time of interpretation on 07/14/2019 at 11:21 am to provider TODD MCDIARMID , who verbally acknowledged these results. Electronically Signed   By: Zetta Bills M.D.   On: 07/14/2019 11:21   Result Date: 07/14/2019 CLINICAL DATA:  Abdominal pain, cough history of hypertension EXAM: CHEST - 2 VIEW COMPARISON:  05/01/2010 FINDINGS: Film is rotated markedly to the right, accounting for this cardiomediastinal contours are stable. Dense basilar consolidation suggested at right lung base silhouetting portion of medial right hemidiaphragm on this projection. No signs of additional consolidative change though study limited by rotation. No acute bone process. IMPRESSION: Dense basilar consolidation suggested at the right lung base silhouetting the medial right hemidiaphragm. Based on appearance findings could be due to central obstructing pulmonary lesion and/or pneumonia. Suggest chest CT for further evaluation. A call is out to the referring provider to further discuss findings in the above case. Electronically Signed: By: Zetta Bills M.D. On: 07/14/2019 11:01   CT CHEST W CONTRAST  Result Date: 07/14/2019 CLINICAL DATA:  69 year old with abdominal pain. Airspace disease and consolidation in right lung on recent abdominal CT. EXAM: CT CHEST WITH CONTRAST TECHNIQUE: Multidetector CT imaging of the chest was performed during intravenous contrast administration. CONTRAST:  144m OMNIPAQUE IOHEXOL 300 MG/ML  SOLN COMPARISON:  Abdominal CT 07/14/2019.  Chest CT 05/02/2010 FINDINGS: Cardiovascular: Coronary artery calcifications. Normal caliber of the thoracic aorta. No evidence for an aortic dissection. Great vessels patent. Celiac trunk, SMA and right renal artery are patent. Atherosclerotic disease in the proximal abdominal aorta and at the aortic arch. Mediastinum/Nodes: Borderline enlarged mediastinal lymph  nodes. Lymph node in the pretracheal region measures 1.1 cm in short axis on sequence 2, image 52. No definite hilar lymphadenopathy. No axillary or supraclavicular lymphadenopathy. The visualized thyroid tissue is unremarkable. No gross abnormality to the esophagus. Lungs/Pleura: Trachea and mainstem bronchi are patent. Volume loss in the right hemithorax with mediastinal shift towards the right. There is collapse or obstruction in the right lower lobe bronchus just beyond the origin. Right middle lobe bronchus is patent but there is distal consolidation or volume loss in right middle lobe. Underlying centrilobular emphysema most conspicuous in the right upper lobe. Left lung is clear. Minimal volume loss at the left lung base. Upper Abdomen: Diffusely low attenuation of the liver is compatible with steatosis. Again noted are inflammatory changes and edema centered around the pancreas. Common bile duct is slightly prominent measuring 1.0 cm. No significant intrahepatic biliary dilatation. Normal appearance of the stomach. There is extraluminal gas in the left upper abdomen. In retrospect, there was irregular gas in this region on the recent abdominal CT. The extraluminal gas appears to be confined within the mesocolon surrounding the sigmoid colon and splenic flexure. Gas does not clearly represent pneumatosis. Sigmoid colon extends up to the left upper abdomen and sigmoid colon is dilated with gas without wall thickening. No evidence for free intraperitoneal air on the right side of the abdomen. Musculoskeletal: Old right posterior eleventh rib fracture. IMPRESSION: 1. Extraluminal gas in  left upper abdomen centered around the sigmoid colon and splenic flexure area. Sigmoid colon is distended with gas in this area. This extraluminal gas appears to be confined to the pericolonic tissue or mesocolon. Findings could be related to a bowel perforation but indeterminate based on the recent abdominal CT. 2. Complete  collapse of the right lower lobe. Etiology for the collapse is unknown. An obstructing endobronchial lesion cannot be excluded. Consider further characterization with bronchoscopy. Small amount of volume loss in the right middle lobe. 3. Edema and inflammatory changes in the upper abdomen centered around the pancreas. Findings are suggestive for pancreatitis. Distal common bile duct is prominent measuring up to 1.0 cm and cannot exclude a distal biliary obstruction. 4. Hepatic steatosis. 5. Coronary artery calcifications. These results were called by telephone at the time of interpretation on 07/14/2019 at 1:42 pm to provider Nuala Alpha , who verbally acknowledged these results. Electronically Signed   By: Markus Daft M.D.   On: 07/14/2019 13:43   MR ABDOMEN MRCP W WO CONTAST  Result Date: 07/14/2019 CLINICAL DATA:  Acute pancreatitis EXAM: MRI ABDOMEN WITHOUT AND WITH CONTRAST (INCLUDING MRCP) TECHNIQUE: Multiplanar multisequence MR imaging of the abdomen was performed both before and after the administration of intravenous contrast. Heavily T2-weighted images of the biliary and pancreatic ducts were obtained, and three-dimensional MRCP images were rendered by post processing. CONTRAST:  24m GADAVIST GADOBUTROL 1 MMOL/ML IV SOLN COMPARISON:  CT abdomen/pelvis dated 07/14/2019 FINDINGS: Lower chest: Right lower lobe atelectasis/collapse, better evaluated on recent CT. Hepatobiliary: Moderate hepatic steatosis. No suspicious/enhancing hepatic lesions. Gallbladder is unremarkable. No cholelithiasis is seen. No intrahepatic ductal dilatation. Common duct measures 6 mm and tapers at the ampulla. No choledocholithiasis is seen. Pancreas: Peripancreatic inflammatory changes, reflecting acute pancreatitis. No pancreatic mass or atrophy. No pancreatic necrosis or hemorrhage. No well-defined peripancreatic fluid collection/pseudocyst. Spleen:  Within normal limits. Adrenals/Urinary Tract:  Adrenal glands are within  normal limits. Kidneys are within normal limits. No hydronephrosis. Stomach/Bowel: Stomach is within normal limits. Wall thickening with inflammatory changes involving the 2nd/3rd portion of the duodenum (series 5/image 15), suggesting secondary duodenitis. Localized pericolonic mesenteric gas/pneumatosis in the left upper abdomen involving a loop of left colon is not well visualized on MR. Vascular/Lymphatic:  No evidence of aneurysm. No suspicious abdominal lymphadenopathy. Other: No abdominal ascites. Musculoskeletal: No focal osseous lesions. IMPRESSION: Acute pancreatitis, without pancreatic necrosis or hemorrhage. No peripancreatic fluid collection/pseudocyst. Secondary wall thickening involving the duodenum, suggesting duodenitis. No intrahepatic or extrahepatic ductal dilatation. No cholelithiasis or choledocholithiasis is seen. Electronically Signed   By: SJulian HyM.D.   On: 07/14/2019 15:13    WCarollee Leitz MD 07/15/2019, 5:49 AM PGY-1, CNorth SultanIntern pager: [phone] text pages welcome

## 2019-07-15 NOTE — Progress Notes (Signed)
Patient ID: Natasha Holland, female   DOB: Jun 19, 1950, 69 y.o.   MRN: [id number]       Subjective: States she feels better today and that pain is improving.  No nausea or vomiting.  No BM since admission.  No other new complaints  ROS: See above, otherwise other systems negative  Objective: Vital signs in last 24 hours: Temp:  [98.5 F (36.9 C)-98.8 F (37.1 C)] 98.5 F (36.9 C) (02/10 0907) Pulse Rate:  [82-101] 87 (02/10 0907) Resp:  [14-38] 18 (02/10 0907) BP: (157-192)/(80-97) 182/94 (02/10 0907) SpO2:  [90 %-99 %] 97 % (02/10 0907) Weight:  [62 kg] 62 kg (02/09 1523) Last BM Date: 07/13/19  Intake/Output from previous day: 02/09 0701 - 02/10 0700 In: 1020.7 [I.V.:914.2; IV Piggyback:106.5] Out: -  Intake/Output this shift: Total I/O In: 1295 [P.O.:240; I.V.:911.4; IV Piggyback:143.6] Out: -   PE: Heart: regular Lungs: CTAB Abd: soft, some tenderness in LUQ and epigastrium. No guarding or rebounding.  +BS, ND Psych: A&O x3  Lab Results:  Recent Labs    07/14/19 0618 07/15/19 0538  WBC 7.2 7.4  HGB 15.1* 14.4  HCT 45.5 43.7  PLT 124* 106*   BMET Recent Labs    07/14/19 0138 07/14/19 0138 07/14/19 0618 07/15/19 0538  NA 133*  --   --  133*  K 4.5  --   --  3.9  CL 98  --   --  97*  CO2 20*  --   --  28  GLUCOSE 116*  --   --  97  BUN 7*  --   --  <5*  CREATININE 0.58   < > 0.74 0.55  CALCIUM 8.7*  --   --  8.5*   < > = values in this interval not displayed.   PT/INR No results for input(s): LABPROT, INR in the last 72 hours. CMP     Component Value Date/Time   NA 133 (L) 07/15/2019 0538   K 3.9 07/15/2019 0538   CL 97 (L) 07/15/2019 0538   CO2 28 07/15/2019 0538   GLUCOSE 97 07/15/2019 0538   BUN <5 (L) 07/15/2019 0538   CREATININE 0.55 07/15/2019 0538   CALCIUM 8.5 (L) 07/15/2019 0538   PROT 6.4 (L) 07/15/2019 0538   ALBUMIN 2.6 (L) 07/15/2019 0538   AST 64 (H) 07/15/2019 0538   ALT 39 07/15/2019 0538   ALKPHOS 69 07/15/2019 0538   BILITOT 1.3 (H) 07/15/2019 0538   GFRNONAA >60 07/15/2019 0538   GFRAA >60 07/15/2019 0538   Lipase     Component Value Date/Time   LIPASE 979 (H) 07/14/2019 0138       Studies/Results: DG Chest 2 View  Addendum Date: 07/14/2019   ADDENDUM REPORT: 07/14/2019 11:21 ADDENDUM: These results were called by telephone at the time of interpretation on 07/14/2019 at 11:21 am to provider TODD MCDIARMID , who verbally acknowledged these results. Electronically Signed   By: Zetta Bills M.D.   On: 07/14/2019 11:21   Result Date: 07/14/2019 CLINICAL DATA:  Abdominal pain, cough history of hypertension EXAM: CHEST - 2 VIEW COMPARISON:  05/01/2010 FINDINGS: Film is rotated markedly to the right, accounting for this cardiomediastinal contours are stable. Dense basilar consolidation suggested at right lung base silhouetting portion of medial right hemidiaphragm on this projection. No signs of additional consolidative change though study limited by rotation. No acute bone process. IMPRESSION: Dense basilar consolidation suggested at the right lung base silhouetting the medial right hemidiaphragm. Based on  appearance findings could be due to central obstructing pulmonary lesion and/or pneumonia. Suggest chest CT for further evaluation. A call is out to the referring provider to further discuss findings in the above case. Electronically Signed: By: Zetta Bills M.D. On: 07/14/2019 11:01   CT CHEST W CONTRAST  Result Date: 07/14/2019 CLINICAL DATA:  69 year old with abdominal pain. Airspace disease and consolidation in right lung on recent abdominal CT. EXAM: CT CHEST WITH CONTRAST TECHNIQUE: Multidetector CT imaging of the chest was performed during intravenous contrast administration. CONTRAST:  133mL OMNIPAQUE IOHEXOL 300 MG/ML  SOLN COMPARISON:  Abdominal CT 07/14/2019.  Chest CT 05/02/2010 FINDINGS: Cardiovascular: Coronary artery calcifications. Normal caliber of the thoracic aorta. No evidence for an aortic  dissection. Great vessels patent. Celiac trunk, SMA and right renal artery are patent. Atherosclerotic disease in the proximal abdominal aorta and at the aortic arch. Mediastinum/Nodes: Borderline enlarged mediastinal lymph nodes. Lymph node in the pretracheal region measures 1.1 cm in short axis on sequence 2, image 52. No definite hilar lymphadenopathy. No axillary or supraclavicular lymphadenopathy. The visualized thyroid tissue is unremarkable. No gross abnormality to the esophagus. Lungs/Pleura: Trachea and mainstem bronchi are patent. Volume loss in the right hemithorax with mediastinal shift towards the right. There is collapse or obstruction in the right lower lobe bronchus just beyond the origin. Right middle lobe bronchus is patent but there is distal consolidation or volume loss in right middle lobe. Underlying centrilobular emphysema most conspicuous in the right upper lobe. Left lung is clear. Minimal volume loss at the left lung base. Upper Abdomen: Diffusely low attenuation of the liver is compatible with steatosis. Again noted are inflammatory changes and edema centered around the pancreas. Common bile duct is slightly prominent measuring 1.0 cm. No significant intrahepatic biliary dilatation. Normal appearance of the stomach. There is extraluminal gas in the left upper abdomen. In retrospect, there was irregular gas in this region on the recent abdominal CT. The extraluminal gas appears to be confined within the mesocolon surrounding the sigmoid colon and splenic flexure. Gas does not clearly represent pneumatosis. Sigmoid colon extends up to the left upper abdomen and sigmoid colon is dilated with gas without wall thickening. No evidence for free intraperitoneal air on the right side of the abdomen. Musculoskeletal: Old right posterior eleventh rib fracture. IMPRESSION: 1. Extraluminal gas in left upper abdomen centered around the sigmoid colon and splenic flexure area. Sigmoid colon is distended  with gas in this area. This extraluminal gas appears to be confined to the pericolonic tissue or mesocolon. Findings could be related to a bowel perforation but indeterminate based on the recent abdominal CT. 2. Complete collapse of the right lower lobe. Etiology for the collapse is unknown. An obstructing endobronchial lesion cannot be excluded. Consider further characterization with bronchoscopy. Small amount of volume loss in the right middle lobe. 3. Edema and inflammatory changes in the upper abdomen centered around the pancreas. Findings are suggestive for pancreatitis. Distal common bile duct is prominent measuring up to 1.0 cm and cannot exclude a distal biliary obstruction. 4. Hepatic steatosis. 5. Coronary artery calcifications. These results were called by telephone at the time of interpretation on 07/14/2019 at 1:42 pm to provider Nuala Alpha , who verbally acknowledged these results. Electronically Signed   By: Markus Daft M.D.   On: 07/14/2019 13:43   CT ABDOMEN PELVIS W CONTRAST  Result Date: 07/14/2019 CLINICAL DATA:  Lower abdominal pain nausea vomiting EXAM: CT ABDOMEN AND PELVIS WITH CONTRAST TECHNIQUE: Multidetector CT imaging  of the abdomen and pelvis was performed using the standard protocol following bolus administration of intravenous contrast. CONTRAST:  1103mL OMNIPAQUE IOHEXOL 300 MG/ML  SOLN COMPARISON:  CT chest May 02, 2010 FINDINGS: Lower chest: The visualized heart size within normal limits. No pericardial fluid/thickening. No hiatal hernia. There is patchy airspace consolidation in the posterior right lung base with small air bronchograms. Hepatobiliary: There is diffuse low density seen throughout the liver parenchyma.The main portal vein is patent. No evidence of calcified gallstones, gallbladder wall thickening. There appears to be mild dilation of the common bile duct at the level of the ampulla measuring up to 1 cm. However no calcified stones are seen. Pancreas: There  is significant mesenteric fat stranding changes seen surrounding the entirety of the pancreas, predominantly around the proximal and mid body. No loculated fluid collections are seen. The SMV is patent. Normal pancreatic enhancement seen throughout. Spleen: Normal in size without focal abnormality. Adrenals/Urinary Tract: Both adrenal glands appear normal. The kidneys and collecting system appear normal without evidence of urinary tract calculus or hydronephrosis. Bladder is unremarkable. Stomach/Bowel: The stomach is normal in appearance. There appears to be mild wall thickening seen surrounding the first and second portion of the duodenum. The remainder of the small bowel and colon are normal in size and contour.The appendix is normal. Vascular/Lymphatic: There are no enlarged mesenteric, retroperitoneal, or pelvic lymph nodes. Scattered aortic atherosclerotic calcifications are seen without aneurysmal dilatation. Reproductive: The uterus and adnexa are unremarkable. Other: No evidence of abdominal wall mass or hernia. Musculoskeletal: No acute or significant osseous findings. IMPRESSION: 1. Findings consistent with acute pancreatitis with diffuse inflammatory changes, however predominantly around the proximal and mid body. No evidence of pancreatic necrosis or pseudocyst. 2. Mildly dilated distal common bile duct at the level of the ampulla, however no calcified stones are seen. 3. Hepatic steatosis 4. Inflammatory changes surrounding the first and second portion of the duodenum. 5. Patchy airspace consolidation with air bronchogram seen at the posterior right lung base which could be due to atelectasis and/or infectious etiology. 6.  Aortic Atherosclerosis (ICD10-I70.0). Electronically Signed   By: Prudencio Pair M.D.   On: 07/14/2019 03:17   MR ABDOMEN MRCP W WO CONTAST  Result Date: 07/14/2019 CLINICAL DATA:  Acute pancreatitis EXAM: MRI ABDOMEN WITHOUT AND WITH CONTRAST (INCLUDING MRCP) TECHNIQUE:  Multiplanar multisequence MR imaging of the abdomen was performed both before and after the administration of intravenous contrast. Heavily T2-weighted images of the biliary and pancreatic ducts were obtained, and three-dimensional MRCP images were rendered by post processing. CONTRAST:  54mL GADAVIST GADOBUTROL 1 MMOL/ML IV SOLN COMPARISON:  CT abdomen/pelvis dated 07/14/2019 FINDINGS: Lower chest: Right lower lobe atelectasis/collapse, better evaluated on recent CT. Hepatobiliary: Moderate hepatic steatosis. No suspicious/enhancing hepatic lesions. Gallbladder is unremarkable. No cholelithiasis is seen. No intrahepatic ductal dilatation. Common duct measures 6 mm and tapers at the ampulla. No choledocholithiasis is seen. Pancreas: Peripancreatic inflammatory changes, reflecting acute pancreatitis. No pancreatic mass or atrophy. No pancreatic necrosis or hemorrhage. No well-defined peripancreatic fluid collection/pseudocyst. Spleen:  Within normal limits. Adrenals/Urinary Tract:  Adrenal glands are within normal limits. Kidneys are within normal limits. No hydronephrosis. Stomach/Bowel: Stomach is within normal limits. Wall thickening with inflammatory changes involving the 2nd/3rd portion of the duodenum (series 5/image 15), suggesting secondary duodenitis. Localized pericolonic mesenteric gas/pneumatosis in the left upper abdomen involving a loop of left colon is not well visualized on MR. Vascular/Lymphatic:  No evidence of aneurysm. No suspicious abdominal lymphadenopathy. Other: No abdominal ascites.  Musculoskeletal: No focal osseous lesions. IMPRESSION: Acute pancreatitis, without pancreatic necrosis or hemorrhage. No peripancreatic fluid collection/pseudocyst. Secondary wall thickening involving the duodenum, suggesting duodenitis. No intrahepatic or extrahepatic ductal dilatation. No cholelithiasis or choledocholithiasis is seen. Electronically Signed   By: Julian Hy M.D.   On: 07/14/2019 15:13     Anti-infectives: Anti-infectives (From admission, onward)   Start     Dose/Rate Route Frequency Ordered Stop   07/14/19 1845  Ampicillin-Sulbactam (UNASYN) 3 g in sodium chloride 0.9 % 100 mL IVPB     3 g 200 mL/hr over 30 Minutes Intravenous Every 6 hours 07/14/19 1832         Assessment/Plan Pancreatitis likely secondary to ETOH abuse RLL collapse, ? Asp PNA  ?extraluminal air near splenic flexure -patient does have some LUQ tenderness along with tenderness in her epigastrium that I expect due to her pancreatitis.  She has no guarding or concerning features like she needs an operation.   -unclear what this etiology is -can likely have clear liquids once ok to have liquids from pancreatic standpoint. -will follow  FEN - NPO per primary VTE - lovenox ID - unasyn for aspiration PNA   LOS: 1 day    Henreitta Cea , [facility] 07/15/2019, 10:31 AM Please see Amion for pager number during day hours 7:00am-4:30pm or 7:00am -11:30am on weekends

## 2019-07-15 NOTE — Progress Notes (Addendum)
UNASSIGNED PATIENT Subjective: Natasha Holland is a 69 year old black female with history of hypertension/PCV prior and alcohol abuse who was admitted for acute alcoholic pancreatitis.  She claims her abdominal pain is almost resolved.  She denies having any nausea vomiting at this time. Her MRCP was negative for choledocholithiasis.  A bronchoscopy is being planned for tomorrow as she has had complete collapse of the right lower lobe; an obstructing endobronchial lesion needs to be excluded..   Objective: Vital signs in last 24 hours: Temp:  [98.5 F (36.9 C)-98.8 F (37.1 C)] 98.5 F (36.9 C) (02/10 0907) Pulse Rate:  [87-101] 87 (02/10 0907) Resp:  [16-19] 18 (02/10 0907) BP: (171-192)/(86-97) 182/94 (02/10 0907) SpO2:  [90 %-99 %] 90 % (02/10 1147) Weight:  [62 kg] 62 kg (02/09 1523) Last BM Date: 07/13/19  Intake/Output from previous day: 02/09 0701 - 02/10 0700 In: 1020.7 [I.V.:914.2; IV Piggyback:106.5] Out: -  Intake/Output this shift: Total I/O In: 1878 [P.O.:540; I.V.:1194.4; IV Piggyback:143.6] Out: -   General appearance: alert, cooperative, fatigued and no distress Resp: clear to auscultation bilaterally Cardio: regular rate and rhythm, S1, S2 normal, no murmur, click, rub or gallop GI: soft, non-tender; bowel sounds normal; no masses,  no organomegaly Extremities: extremities normal, atraumatic, no cyanosis or edema  Lab Results: Recent Labs    07/14/19 0138 07/14/19 0618 07/15/19 0538  WBC 5.8 7.2 7.4  HGB 16.5* 15.1* 14.4  HCT 51.1* 45.5 43.7  PLT 101* 124* 106*   BMET Recent Labs    07/14/19 0138 07/14/19 0618 07/15/19 0538  NA 133*  --  133*  K 4.5  --  3.9  CL 98  --  97*  CO2 20*  --  28  GLUCOSE 116*  --  97  BUN 7*  --  <5*  CREATININE 0.58 0.74 0.55  CALCIUM 8.7*  --  8.5*   LFT Recent Labs    07/15/19 0538  PROT 6.4*  ALBUMIN 2.6*  AST 64*  ALT 39  ALKPHOS 69  BILITOT 1.3*   PT/INR No results for input(s): LABPROT, INR in the  last 72 hours. Hepatitis Panel No results for input(s): HEPBSAG, HCVAB, HEPAIGM, HEPBIGM in the last 72 hours. C-Diff No results for input(s): CDIFFTOX in the last 72 hours. No results for input(s): CDIFFPCR in the last 72 hours. Fecal Lactopherrin No results for input(s): FECLLACTOFRN in the last 72 hours.  Studies/Results: DG Chest 2 View  Addendum Date: 07/14/2019   ADDENDUM REPORT: 07/14/2019 11:21 ADDENDUM: These results were called by telephone at the time of interpretation on 07/14/2019 at 11:21 am to provider TODD MCDIARMID , who verbally acknowledged these results. Electronically Signed   By: Zetta Bills M.D.   On: 07/14/2019 11:21   Result Date: 07/14/2019 CLINICAL DATA:  Abdominal pain, cough history of hypertension EXAM: CHEST - 2 VIEW COMPARISON:  05/01/2010 FINDINGS: Film is rotated markedly to the right, accounting for this cardiomediastinal contours are stable. Dense basilar consolidation suggested at right lung base silhouetting portion of medial right hemidiaphragm on this projection. No signs of additional consolidative change though study limited by rotation. No acute bone process. IMPRESSION: Dense basilar consolidation suggested at the right lung base silhouetting the medial right hemidiaphragm. Based on appearance findings could be due to central obstructing pulmonary lesion and/or pneumonia. Suggest chest CT for further evaluation. A call is out to the referring provider to further discuss findings in the above case. Electronically Signed: By: Zetta Bills M.D. On: 07/14/2019 11:01  CT CHEST W CONTRAST  Result Date: 07/14/2019 CLINICAL DATA:  69 year old with abdominal pain. Airspace disease and consolidation in right lung on recent abdominal CT. EXAM: CT CHEST WITH CONTRAST TECHNIQUE: Multidetector CT imaging of the chest was performed during intravenous contrast administration. CONTRAST:  130mL OMNIPAQUE IOHEXOL 300 MG/ML  SOLN COMPARISON:  Abdominal CT 07/14/2019.  Chest  CT 05/02/2010 FINDINGS: Cardiovascular: Coronary artery calcifications. Normal caliber of the thoracic aorta. No evidence for an aortic dissection. Great vessels patent. Celiac trunk, SMA and right renal artery are patent. Atherosclerotic disease in the proximal abdominal aorta and at the aortic arch. Mediastinum/Nodes: Borderline enlarged mediastinal lymph nodes. Lymph node in the pretracheal region measures 1.1 cm in short axis on sequence 2, image 52. No definite hilar lymphadenopathy. No axillary or supraclavicular lymphadenopathy. The visualized thyroid tissue is unremarkable. No gross abnormality to the esophagus. Lungs/Pleura: Trachea and mainstem bronchi are patent. Volume loss in the right hemithorax with mediastinal shift towards the right. There is collapse or obstruction in the right lower lobe bronchus just beyond the origin. Right middle lobe bronchus is patent but there is distal consolidation or volume loss in right middle lobe. Underlying centrilobular emphysema most conspicuous in the right upper lobe. Left lung is clear. Minimal volume loss at the left lung base. Upper Abdomen: Diffusely low attenuation of the liver is compatible with steatosis. Again noted are inflammatory changes and edema centered around the pancreas. Common bile duct is slightly prominent measuring 1.0 cm. No significant intrahepatic biliary dilatation. Normal appearance of the stomach. There is extraluminal gas in the left upper abdomen. In retrospect, there was irregular gas in this region on the recent abdominal CT. The extraluminal gas appears to be confined within the mesocolon surrounding the sigmoid colon and splenic flexure. Gas does not clearly represent pneumatosis. Sigmoid colon extends up to the left upper abdomen and sigmoid colon is dilated with gas without wall thickening. No evidence for free intraperitoneal air on the right side of the abdomen. Musculoskeletal: Old right posterior eleventh rib fracture.  IMPRESSION: 1. Extraluminal gas in left upper abdomen centered around the sigmoid colon and splenic flexure area. Sigmoid colon is distended with gas in this area. This extraluminal gas appears to be confined to the pericolonic tissue or mesocolon. Findings could be related to a bowel perforation but indeterminate based on the recent abdominal CT. 2. Complete collapse of the right lower lobe. Etiology for the collapse is unknown. An obstructing endobronchial lesion cannot be excluded. Consider further characterization with bronchoscopy. Small amount of volume loss in the right middle lobe. 3. Edema and inflammatory changes in the upper abdomen centered around the pancreas. Findings are suggestive for pancreatitis. Distal common bile duct is prominent measuring up to 1.0 cm and cannot exclude a distal biliary obstruction. 4. Hepatic steatosis. 5. Coronary artery calcifications. These results were called by telephone at the time of interpretation on 07/14/2019 at 1:42 pm to provider Nuala Alpha , who verbally acknowledged these results. Electronically Signed   By: Markus Daft M.D.   On: 07/14/2019 13:43   CT ABDOMEN PELVIS W CONTRAST  Result Date: 07/14/2019 CLINICAL DATA:  Lower abdominal pain nausea vomiting EXAM: CT ABDOMEN AND PELVIS WITH CONTRAST TECHNIQUE: Multidetector CT imaging of the abdomen and pelvis was performed using the standard protocol following bolus administration of intravenous contrast. CONTRAST:  144mL OMNIPAQUE IOHEXOL 300 MG/ML  SOLN COMPARISON:  CT chest May 02, 2010 FINDINGS: Lower chest: The visualized heart size within normal limits. No pericardial fluid/thickening.  No hiatal hernia. There is patchy airspace consolidation in the posterior right lung base with small air bronchograms. Hepatobiliary: There is diffuse low density seen throughout the liver parenchyma.The main portal vein is patent. No evidence of calcified gallstones, gallbladder wall thickening. There appears to be  mild dilation of the common bile duct at the level of the ampulla measuring up to 1 cm. However no calcified stones are seen. Pancreas: There is significant mesenteric fat stranding changes seen surrounding the entirety of the pancreas, predominantly around the proximal and mid body. No loculated fluid collections are seen. The SMV is patent. Normal pancreatic enhancement seen throughout. Spleen: Normal in size without focal abnormality. Adrenals/Urinary Tract: Both adrenal glands appear normal. The kidneys and collecting system appear normal without evidence of urinary tract calculus or hydronephrosis. Bladder is unremarkable. Stomach/Bowel: The stomach is normal in appearance. There appears to be mild wall thickening seen surrounding the first and second portion of the duodenum. The remainder of the small bowel and colon are normal in size and contour.The appendix is normal. Vascular/Lymphatic: There are no enlarged mesenteric, retroperitoneal, or pelvic lymph nodes. Scattered aortic atherosclerotic calcifications are seen without aneurysmal dilatation. Reproductive: The uterus and adnexa are unremarkable. Other: No evidence of abdominal wall mass or hernia. Musculoskeletal: No acute or significant osseous findings. IMPRESSION: 1. Findings consistent with acute pancreatitis with diffuse inflammatory changes, however predominantly around the proximal and mid body. No evidence of pancreatic necrosis or pseudocyst. 2. Mildly dilated distal common bile duct at the level of the ampulla, however no calcified stones are seen. 3. Hepatic steatosis 4. Inflammatory changes surrounding the first and second portion of the duodenum. 5. Patchy airspace consolidation with air bronchogram seen at the posterior right lung base which could be due to atelectasis and/or infectious etiology. 6.  Aortic Atherosclerosis (ICD10-I70.0). Electronically Signed   By: Prudencio Pair M.D.   On: 07/14/2019 03:17   MR ABDOMEN MRCP W WO  CONTAST  Result Date: 07/14/2019 CLINICAL DATA:  Acute pancreatitis EXAM: MRI ABDOMEN WITHOUT AND WITH CONTRAST (INCLUDING MRCP) TECHNIQUE: Multiplanar multisequence MR imaging of the abdomen was performed both before and after the administration of intravenous contrast. Heavily T2-weighted images of the biliary and pancreatic ducts were obtained, and three-dimensional MRCP images were rendered by post processing. CONTRAST:  6mL GADAVIST GADOBUTROL 1 MMOL/ML IV SOLN COMPARISON:  CT abdomen/pelvis dated 07/14/2019 FINDINGS: Lower chest: Right lower lobe atelectasis/collapse, better evaluated on recent CT. Hepatobiliary: Moderate hepatic steatosis. No suspicious/enhancing hepatic lesions. Gallbladder is unremarkable. No cholelithiasis is seen. No intrahepatic ductal dilatation. Common duct measures 6 mm and tapers at the ampulla. No choledocholithiasis is seen. Pancreas: Peripancreatic inflammatory changes, reflecting acute pancreatitis. No pancreatic mass or atrophy. No pancreatic necrosis or hemorrhage. No well-defined peripancreatic fluid collection/pseudocyst. Spleen:  Within normal limits. Adrenals/Urinary Tract:  Adrenal glands are within normal limits. Kidneys are within normal limits. No hydronephrosis. Stomach/Bowel: Stomach is within normal limits. Wall thickening with inflammatory changes involving the 2nd/3rd portion of the duodenum (series 5/image 15), suggesting secondary duodenitis. Localized pericolonic mesenteric gas/pneumatosis in the left upper abdomen involving a loop of left colon is not well visualized on MR. Vascular/Lymphatic:  No evidence of aneurysm. No suspicious abdominal lymphadenopathy. Other: No abdominal ascites. Musculoskeletal: No focal osseous lesions. IMPRESSION: Acute pancreatitis, without pancreatic necrosis or hemorrhage. No peripancreatic fluid collection/pseudocyst. Secondary wall thickening involving the duodenum, suggesting duodenitis. No intrahepatic or extrahepatic ductal  dilatation. No cholelithiasis or choledocholithiasis is seen. Electronically Signed   By: Julian Hy  M.D.   On: 07/14/2019 15:13   Medications: I have reviewed the patient's current medications.  Assessment/Plan: 1) Acute pancreatitis which seems to be improving.  We can advance her diet as tolerated after bronchoscopy has been completed. 2) Right lower lung collapse for bronchoscopy tomorrow as mentioned. 3) Abnormal CT scan showing some extraluminal gas in the left upper quadrant centered around the sigmoid colon and splenic flexure-etiology unclear. 4) Wall thickening of the duodenum noted on MRI "possible duodenitis  LOS: 1 day   Juanita Craver 07/15/2019, 1:27 PM

## 2019-07-15 NOTE — Plan of Care (Signed)
  Problem: Education: Goal: Knowledge of medication regimen will be met for pain relief regimen by discharge Outcome: Progressing   Problem: Medication: Goal: Compliance with prescribed medication regimen will improve by discharge Outcome: Progressing

## 2019-07-15 NOTE — Progress Notes (Signed)
Saw that prior charted O2 saturation was 82.  This would have been uncharacteristic for the patient given her stay, walked up to the floor and met the nurse tech rechecking the oxygen saturation which was 94.  Patient was conversing comfortably with no respiratory distress at all.  Dr. Bland 

## 2019-07-16 ENCOUNTER — Encounter (HOSPITAL_COMMUNITY)
Admission: EM | Disposition: A | Discharge status: Home / Self Care | Payer: Self-pay | Source: Home / Self Care | Attending: Family Medicine

## 2019-07-16 ENCOUNTER — Encounter (HOSPITAL_COMMUNITY): Payer: Self-pay | Admitting: Family Medicine

## 2019-07-16 DIAGNOSIS — J189 Pneumonia, unspecified organism: Secondary | ICD-10-CM

## 2019-07-16 HISTORY — PX: BRONCHIAL WASHINGS: SHX5105

## 2019-07-16 HISTORY — PX: VIDEO BRONCHOSCOPY: SHX5072

## 2019-07-16 LAB — CBC
HCT: 42.3 % (ref 36.0–46.0)
Hemoglobin: 13.8 g/dL (ref 12.0–15.0)
MCH: 32.1 pg (ref 26.0–34.0)
MCHC: 32.6 g/dL (ref 30.0–36.0)
MCV: 98.4 fL (ref 80.0–100.0)
Platelets: 106 10*3/uL — ABNORMAL LOW (ref 150–400)
RBC: 4.3 MIL/uL (ref 3.87–5.11)
RDW: 13.5 % (ref 11.5–15.5)
WBC: 7.8 10*3/uL (ref 4.0–10.5)
nRBC: 0 % (ref 0.0–0.2)

## 2019-07-16 LAB — BASIC METABOLIC PANEL
Anion gap: 13 (ref 5–15)
BUN: <5 mg/dL — ABNORMAL LOW (ref 8–23)
CO2: 32 mmol/L (ref 22–32)
Calcium: 8.7 mg/dL — ABNORMAL LOW (ref 8.9–10.3)
Chloride: 92 mmol/L — ABNORMAL LOW (ref 98–111)
Creatinine, Ser: 0.68 mg/dL (ref 0.44–1.00)
GFR calc Af Amer: >60 mL/min (ref >60)
GFR calc non Af Amer: >60 mL/min (ref >60)
Glucose, Bld: 86 mg/dL (ref 70–99)
Potassium: 3.4 mmol/L — ABNORMAL LOW (ref 3.5–5.1)
Sodium: 137 mmol/L (ref 135–145)

## 2019-07-16 LAB — PROCALCITONIN: Procalcitonin: 0.25 ng/mL

## 2019-07-16 SURGERY — VIDEO BRONCHOSCOPY WITHOUT FLUORO
Anesthesia: Moderate Sedation

## 2019-07-16 MED ORDER — BUTAMBEN-TETRACAINE-BENZOCAINE 2-2-14 % EX AERO
INHALATION_SPRAY | CUTANEOUS | Status: DC | PRN
  Administered 2019-07-16: 1 via TOPICAL

## 2019-07-16 MED ORDER — OXYCODONE HCL ER 10 MG PO T12A
10.0000 mg | EXTENDED_RELEASE_TABLET | Freq: Two times a day (BID) | ORAL | Status: DC
  Administered 2019-07-16: 10 mg via ORAL
  Filled 2019-07-16: qty 1

## 2019-07-16 MED ORDER — PHENYLEPHRINE HCL 1 % NA SOLN
NASAL | Status: AC
  Filled 2019-07-16: qty 15

## 2019-07-16 MED ORDER — MIDAZOLAM HCL (PF) 5 MG/ML IJ SOLN
INTRAMUSCULAR | Status: AC
  Filled 2019-07-16: qty 3

## 2019-07-16 MED ORDER — LACTATED RINGERS IV SOLN: INTRAVENOUS | Status: DC

## 2019-07-16 MED ORDER — FENTANYL CITRATE (PF) 100 MCG/2ML IJ SOLN
INTRAMUSCULAR | Status: DC | PRN
  Administered 2019-07-16 (×2): 50 ug via INTRAVENOUS

## 2019-07-16 MED ORDER — BUTAMBEN-TETRACAINE-BENZOCAINE 2-2-14 % EX AERO
1.0000 | INHALATION_SPRAY | Freq: Once | CUTANEOUS | Status: DC
  Filled 2019-07-16: qty 20

## 2019-07-16 MED ORDER — LIDOCAINE HCL URETHRAL/MUCOSAL 2 % EX GEL
1.0000 "application " | Freq: Once | CUTANEOUS | Status: DC
  Filled 2019-07-16: qty 20

## 2019-07-16 MED ORDER — LIDOCAINE HCL (PF) 1 % IJ SOLN
INTRAMUSCULAR | Status: AC
  Filled 2019-07-16: qty 30

## 2019-07-16 MED ORDER — POTASSIUM CHLORIDE 10 MEQ/100ML IV SOLN: 10.0000 meq | INTRAVENOUS | Status: AC

## 2019-07-16 MED ORDER — PHENYLEPHRINE HCL 0.25 % NA SOLN
1.0000 | Freq: Four times a day (QID) | NASAL | Status: DC | PRN
  Filled 2019-07-16: qty 15

## 2019-07-16 MED ORDER — POTASSIUM CHLORIDE 20 MEQ PO PACK
40.0000 meq | PACK | Freq: Once | ORAL | Status: AC
  Administered 2019-07-16: 40 meq via ORAL
  Filled 2019-07-16: qty 2

## 2019-07-16 MED ORDER — LIDOCAINE HCL URETHRAL/MUCOSAL 2 % EX GEL
CUTANEOUS | Status: AC
  Filled 2019-07-16: qty 20

## 2019-07-16 MED ORDER — FENTANYL CITRATE (PF) 100 MCG/2ML IJ SOLN
INTRAMUSCULAR | Status: AC
  Filled 2019-07-16: qty 4

## 2019-07-16 MED ORDER — LIDOCAINE HCL URETHRAL/MUCOSAL 2 % EX GEL
CUTANEOUS | Status: DC | PRN
  Administered 2019-07-16: 1

## 2019-07-16 MED ORDER — POTASSIUM CHLORIDE 10 MEQ/100ML IV SOLN
10.0000 meq | INTRAVENOUS | Status: AC
  Administered 2019-07-16: 10 meq via INTRAVENOUS
  Filled 2019-07-16 (×2): qty 100

## 2019-07-16 MED ORDER — MIDAZOLAM HCL (PF) 10 MG/2ML IJ SOLN
INTRAMUSCULAR | Status: DC | PRN
  Administered 2019-07-16 (×3): 1 mg via INTRAVENOUS
  Administered 2019-07-16: 2 mg via INTRAVENOUS
  Administered 2019-07-16: 1 mg via INTRAVENOUS

## 2019-07-16 NOTE — Progress Notes (Addendum)
Patient ID: Natasha Holland, female   DOB: 11/16/1950, 69 y.o.   MRN: [id number]       Subjective: Patient feels well today.  Had clears yesterday with no issues.  No further abdominal pain.  Had some O2 issues overnight and plan for bronch today per pulm.  ROS: See above, otherwise other systems negative  Objective: Vital signs in last 24 hours: Temp:  [98.7 F (37.1 C)-99.2 F (37.3 C)] 98.7 F (37.1 C) (02/11 0510) Pulse Rate:  [87-110] 87 (02/11 0510) Resp:  [18] 18 (02/11 0510) BP: (157-168)/(85-97) 168/97 (02/11 0510) SpO2:  [82 %-96 %] 91 % (02/11 0510) Weight:  [62 kg] 62 kg (02/10 2033) Last BM Date: 07/13/19  Intake/Output from previous day: 02/10 0701 - 02/11 0700 In: 4826 [P.O.:660; I.V.:3722.5; IV Piggyback:443.5] Out: 0  Intake/Output this shift: No intake/output data recorded.  PE: Gen: NAD Heart: regular Lungs: some rhonchi noted at left lung base today, otherwise clear Abd: soft, NT, ND, +BS Psych: A&Ox3  Lab Results:  Recent Labs    07/15/19 0538 07/16/19 0709  WBC 7.4 7.8  HGB 14.4 13.8  HCT 43.7 42.3  PLT 106* 106*   BMET Recent Labs    07/15/19 0538 07/16/19 0709  NA 133* 137  K 3.9 3.4*  CL 97* 92*  CO2 28 32  GLUCOSE 97 86  BUN <5* <5*  CREATININE 0.55 0.68  CALCIUM 8.5* 8.7*   PT/INR No results for input(s): LABPROT, INR in the last 72 hours. CMP     Component Value Date/Time   NA 137 07/16/2019 0709   K 3.4 (L) 07/16/2019 0709   CL 92 (L) 07/16/2019 0709   CO2 32 07/16/2019 0709   GLUCOSE 86 07/16/2019 0709   BUN <5 (L) 07/16/2019 0709   CREATININE 0.68 07/16/2019 0709   CALCIUM 8.7 (L) 07/16/2019 0709   PROT 6.4 (L) 07/15/2019 0538   ALBUMIN 2.6 (L) 07/15/2019 0538   AST 64 (H) 07/15/2019 0538   ALT 39 07/15/2019 0538   ALKPHOS 69 07/15/2019 0538   BILITOT 1.3 (H) 07/15/2019 0538   GFRNONAA >60 07/16/2019 0709   GFRAA >60 07/16/2019 0709   Lipase     Component Value Date/Time   LIPASE 979 (H) 07/14/2019 0138        Studies/Results: DG Chest 2 View  Addendum Date: 07/14/2019   ADDENDUM REPORT: 07/14/2019 11:21 ADDENDUM: These results were called by telephone at the time of interpretation on 07/14/2019 at 11:21 am to provider TODD MCDIARMID , who verbally acknowledged these results. Electronically Signed   By: Zetta Bills M.D.   On: 07/14/2019 11:21   Result Date: 07/14/2019 CLINICAL DATA:  Abdominal pain, cough history of hypertension EXAM: CHEST - 2 VIEW COMPARISON:  05/01/2010 FINDINGS: Film is rotated markedly to the right, accounting for this cardiomediastinal contours are stable. Dense basilar consolidation suggested at right lung base silhouetting portion of medial right hemidiaphragm on this projection. No signs of additional consolidative change though study limited by rotation. No acute bone process. IMPRESSION: Dense basilar consolidation suggested at the right lung base silhouetting the medial right hemidiaphragm. Based on appearance findings could be due to central obstructing pulmonary lesion and/or pneumonia. Suggest chest CT for further evaluation. A call is out to the referring provider to further discuss findings in the above case. Electronically Signed: By: Zetta Bills M.D. On: 07/14/2019 11:01   CT CHEST W CONTRAST  Result Date: 07/14/2019 CLINICAL DATA:  69 year old with abdominal pain. Airspace disease  and consolidation in right lung on recent abdominal CT. EXAM: CT CHEST WITH CONTRAST TECHNIQUE: Multidetector CT imaging of the chest was performed during intravenous contrast administration. CONTRAST:  171mL OMNIPAQUE IOHEXOL 300 MG/ML  SOLN COMPARISON:  Abdominal CT 07/14/2019.  Chest CT 05/02/2010 FINDINGS: Cardiovascular: Coronary artery calcifications. Normal caliber of the thoracic aorta. No evidence for an aortic dissection. Great vessels patent. Celiac trunk, SMA and right renal artery are patent. Atherosclerotic disease in the proximal abdominal aorta and at the aortic arch.  Mediastinum/Nodes: Borderline enlarged mediastinal lymph nodes. Lymph node in the pretracheal region measures 1.1 cm in short axis on sequence 2, image 52. No definite hilar lymphadenopathy. No axillary or supraclavicular lymphadenopathy. The visualized thyroid tissue is unremarkable. No gross abnormality to the esophagus. Lungs/Pleura: Trachea and mainstem bronchi are patent. Volume loss in the right hemithorax with mediastinal shift towards the right. There is collapse or obstruction in the right lower lobe bronchus just beyond the origin. Right middle lobe bronchus is patent but there is distal consolidation or volume loss in right middle lobe. Underlying centrilobular emphysema most conspicuous in the right upper lobe. Left lung is clear. Minimal volume loss at the left lung base. Upper Abdomen: Diffusely low attenuation of the liver is compatible with steatosis. Again noted are inflammatory changes and edema centered around the pancreas. Common bile duct is slightly prominent measuring 1.0 cm. No significant intrahepatic biliary dilatation. Normal appearance of the stomach. There is extraluminal gas in the left upper abdomen. In retrospect, there was irregular gas in this region on the recent abdominal CT. The extraluminal gas appears to be confined within the mesocolon surrounding the sigmoid colon and splenic flexure. Gas does not clearly represent pneumatosis. Sigmoid colon extends up to the left upper abdomen and sigmoid colon is dilated with gas without wall thickening. No evidence for free intraperitoneal air on the right side of the abdomen. Musculoskeletal: Old right posterior eleventh rib fracture. IMPRESSION: 1. Extraluminal gas in left upper abdomen centered around the sigmoid colon and splenic flexure area. Sigmoid colon is distended with gas in this area. This extraluminal gas appears to be confined to the pericolonic tissue or mesocolon. Findings could be related to a bowel perforation but  indeterminate based on the recent abdominal CT. 2. Complete collapse of the right lower lobe. Etiology for the collapse is unknown. An obstructing endobronchial lesion cannot be excluded. Consider further characterization with bronchoscopy. Small amount of volume loss in the right middle lobe. 3. Edema and inflammatory changes in the upper abdomen centered around the pancreas. Findings are suggestive for pancreatitis. Distal common bile duct is prominent measuring up to 1.0 cm and cannot exclude a distal biliary obstruction. 4. Hepatic steatosis. 5. Coronary artery calcifications. These results were called by telephone at the time of interpretation on 07/14/2019 at 1:42 pm to provider Nuala Alpha , who verbally acknowledged these results. Electronically Signed   By: Markus Daft M.D.   On: 07/14/2019 13:43   MR 3D Recon At Scanner  Result Date: 07/15/2019 CLINICAL DATA:  Acute pancreatitis EXAM: 3-DIMENSIONAL MR IMAGE RENDERING ON ACQUISITION WORKSTATION TECHNIQUE: 3-dimensional MR images were rendered by post-processing of the original MR data on an acquisition workstation. The 3-dimensional MR images were interpreted and findings were reported in the accompanying complete MR report for this study COMPARISON:  CT abdomen/pelvis dated 07/14/2019. FINDINGS: Lower chest: Right lower lobe atelectasis/collapse, better evaluated on recent CT. Hepatobiliary: Moderate hepatic steatosis. No suspicious/enhancing hepatic lesions. Gallbladder is unremarkable. No cholelithiasis is seen.  No intrahepatic ductal dilatation. Common duct measures 6 mm and tapers at the ampulla. No choledocholithiasis is seen. Pancreas: Peripancreatic inflammatory changes, reflecting acute pancreatitis. No pancreatic mass or atrophy. No pancreatic necrosis or hemorrhage. No well-defined peripancreatic fluid collection/pseudocyst. Spleen: Within normal limits. Adrenals/Urinary Tract: Adrenal glands are within normal limits. Kidneys are within  normal limits. No hydronephrosis. Stomach/Bowel: Stomach is within normal limits. Wall thickening with inflammatory changes involving the 2nd/3rd portion of the duodenum (series 5/image 15), suggesting secondary duodenitis. Localized pericolonic mesenteric gas/pneumatosis in the left upper abdomen involving a loop of left colon is not well visualized on MR. Vascular/Lymphatic: No evidence of aneurysm. No suspicious abdominal lymphadenopathy. Other: No abdominal ascites. Musculoskeletal: No focal osseous lesions. IMPRESSION: Acute pancreatitis, without pancreatic necrosis or hemorrhage. No peripancreatic fluid collection/pseudocyst. Secondary wall thickening involving the duodenum, suggesting duodenitis. No intrahepatic or extrahepatic ductal dilatation. No cholelithiasis or choledocholithiasis is seen. Electronically Signed   By: Julian Hy M.D.   On: 07/15/2019 13:21   MR ABDOMEN MRCP W WO CONTAST  Result Date: 07/14/2019 CLINICAL DATA:  Acute pancreatitis EXAM: MRI ABDOMEN WITHOUT AND WITH CONTRAST (INCLUDING MRCP) TECHNIQUE: Multiplanar multisequence MR imaging of the abdomen was performed both before and after the administration of intravenous contrast. Heavily T2-weighted images of the biliary and pancreatic ducts were obtained, and three-dimensional MRCP images were rendered by post processing. CONTRAST:  65mL GADAVIST GADOBUTROL 1 MMOL/ML IV SOLN COMPARISON:  CT abdomen/pelvis dated 07/14/2019 FINDINGS: Lower chest: Right lower lobe atelectasis/collapse, better evaluated on recent CT. Hepatobiliary: Moderate hepatic steatosis. No suspicious/enhancing hepatic lesions. Gallbladder is unremarkable. No cholelithiasis is seen. No intrahepatic ductal dilatation. Common duct measures 6 mm and tapers at the ampulla. No choledocholithiasis is seen. Pancreas: Peripancreatic inflammatory changes, reflecting acute pancreatitis. No pancreatic mass or atrophy. No pancreatic necrosis or hemorrhage. No well-defined  peripancreatic fluid collection/pseudocyst. Spleen:  Within normal limits. Adrenals/Urinary Tract:  Adrenal glands are within normal limits. Kidneys are within normal limits. No hydronephrosis. Stomach/Bowel: Stomach is within normal limits. Wall thickening with inflammatory changes involving the 2nd/3rd portion of the duodenum (series 5/image 15), suggesting secondary duodenitis. Localized pericolonic mesenteric gas/pneumatosis in the left upper abdomen involving a loop of left colon is not well visualized on MR. Vascular/Lymphatic:  No evidence of aneurysm. No suspicious abdominal lymphadenopathy. Other: No abdominal ascites. Musculoskeletal: No focal osseous lesions. IMPRESSION: Acute pancreatitis, without pancreatic necrosis or hemorrhage. No peripancreatic fluid collection/pseudocyst. Secondary wall thickening involving the duodenum, suggesting duodenitis. No intrahepatic or extrahepatic ductal dilatation. No cholelithiasis or choledocholithiasis is seen. Electronically Signed   By: Julian Hy M.D.   On: 07/14/2019 15:13    Anti-infectives: Anti-infectives (From admission, onward)   Start     Dose/Rate Route Frequency Ordered Stop   07/14/19 1845  Ampicillin-Sulbactam (UNASYN) 3 g in sodium chloride 0.9 % 100 mL IVPB     3 g 200 mL/hr over 30 Minutes Intravenous Every 6 hours 07/14/19 1832         Assessment/Plan Pancreatitis likely secondary to ETOH abuse RLL collapse, ? Asp PNA  ?extraluminal air near splenic flexure -abdominal pain resolved today.  Tolerating CLD. -unclear what this etiology is -adv diet as tolerates from our standpoint.  No plans for surgery -will follow  FEN - NPO for procedure today.  May have diet after procedure today. VTE - lovenox ID - unasyn for aspiration PNA   LOS: 2 days    Henreitta Cea , [facility] Surgery 07/16/2019, 10:14 AM Please see Amion for  pager number during day hours 7:00am-4:30pm or 7:00am -11:30am on weekends

## 2019-07-16 NOTE — Progress Notes (Signed)
   NAME:  Natasha Holland, MRN:  [id number], DOB:  February 08, 1951, LOS: 2 ADMISSION DATE:  07/13/2019, CONSULTATION DATE:  2/9 REFERRING MD:  Teaching service, CHIEF COMPLAINT:  RLL collapase   Brief History   69 year old female with ETOH history admitted for acute pancreatitis. Incidentally found to have RLL collapse on CXR and PCCM consulted.     Past Medical History    has a past medical history of Hypertension and Polycythemia vera(238.4).  Significant Hospital Events   07/13/2019  - admit  Consults:  PCCM CCS  Procedures:    Significant Diagnostic Tests:  CT chest/abd/pel 2/9 > Extraluminal gas in left upper abdomen centered around the sigmoid colon and splenic flexure area. Sigmoid colon is distended with gas in this area. Complete collapse of the right lower lobe. Etiology for the collapse is unknown. An obstructing endobronchial lesion cannot be excluded. Consider further characterization with bronchoscopy. Small amount of volume loss in the right middle lobe.  MRCP 2/10 >  Micro Data:    Antimicrobials:  Unasyn 2/9 >   Interim history/subjective:  NO cough or wheezing Febrile 101.5  Objective   Blood pressure (!) 176/101, pulse (!) 129, temperature (!) 101.5 F (38.6 C), temperature source Oral, resp. rate 14, height _0  (1.575 m), weight 62 kg, SpO2 94 %.        Intake/Output Summary (Last 24 hours) at 07/16/2019 1249 Last data filed at 07/16/2019 0510 Gross per 24 hour  Intake 3194.98 ml  Output 0 ml  Net 3194.98 ml   Filed Weights   07/14/19 1523 07/15/19 2033  Weight: 62 kg 62 kg    Examination:  General: frail elderly female resting in bed, no distress HENT: [place]/AT, PERRL, no JVD Lungs: decreased air on RT base Cardiovascular: Normal heart sounds Abdomen: Soft, non-tender, non-distended Extremities: No acute deformity or ROM limitation Neuro: Alert and oriented x3 speech normal  Labs - nml WBC count , mild hypokalemia  Resolved Hospital Problem list      Assessment & Plan:   Right lower lobe collapse in the setting of baseline previous smoking, ongoing alcohol history of poor dentition. She has recently been vomiting in the setting of acute pancreatitis.   Most likely she has had an aspiration event and probably has mucous plugging, however, endobronchial lesion cannot be excluded. Procalcitonin is not negative, but it is low.   Plan -Check for urine Streptococcus and urine Legionella  -Bronchoscopy did not show any endobronchial lesion or foreign body - FU BAL cx -Unasyn is appropriate  -Aggressive pulmonary toilet - Will need FU CXR to resolution as outpatient    Acute pancreatitis Extraluminal bowel gas noted on imaging-not surgical - per primary/surgery following    Kara Mead MD. FCCP. [facility]  If no response to pager , please call [phone]   07/16/2019

## 2019-07-16 NOTE — Progress Notes (Addendum)
Family Medicine Teaching Service Daily Progress Note Intern Pager: [phone]  Patient name: Natasha Holland Medical record number: [id number] Date of birth: [birth year] Age: 69 y.o. Gender: female  Primary Care Provider: Patient, No Pcp Per Consultants: None Code Status: DNI  Pt Overview and Major Events to Date:  02/09-admitted  Assessment and Plan: Natasha Holland is a 69 y.o. female presenting with abdominal pain. PMH is significant for HTN, hyperglycemia, and ETOH abuse.   Acute Pancreatitis likely secondary to ETOH Resolving.  Stable overnight.  Pain controlled with current regime.  Denies any increasing abdominal pain.  Abdominal exam remains unchanged.  Bowel sounds present, soft and nontender. -GI following appreciate recommendations, can advance diet as tolerated post bronc -Surgery following appreciate recommendations, can have clear fluids, repeat CT abdomen today or tomorrow. -Continue IV hydration LR 75/hr -Follow-up urine for Streptococcus and Legionella -Discontinue Oxy contin -Tylenol 650 mg every 6 hours as needed -Vital signs per floor continuous cardiac and pulse ox monitoring  CT abdo showed atelectasis/infectious process CT chest showed complete right lung lung collapse.  Unknown etiology. Denies any shortness of breath or chest pain. Scheduled for Bronchoscopy today at noon -Pulmonology following, appreciate recommendations -Monitor respiratory status -Pulmonary toiletting  ETOH Abuse Last EtOH 02/08.  Last documented CIWA score 1 yesterday.  Denies any headaches, auditory or visual hallucinations.  On exam patient pleasant, nontremulous alert and orientated x3, no anxiety noted -Continue folic acid, vitamins and thiamine -CIWA protocol -Notify MD if CIWA score greater than 5  HTN  Systolic pressures overnight 160s. -Continue Norvasc 5 mg daily -Continue to monitor  Hx of hyperglycemia HbA1c 4.9.  Serum glucose 86 -Continue to monitor  GOC: does not  currently have pcp and would llike to start seeing one regularly -consult TOC for pcp  FEN/GI:  -N.p.o. for procedure Prophylaxis:  -Lovenox 40 mg subcu  Disposition: Home when medically stable  Subjective:  No acute events overnight.  Denies any chest pain, shortness of breath or abdominal  pain.  Is hungry and would like to eat.  Reports abdominal pain is much improved.  Objective: Temp:  [98.5 F (36.9 C)-99.2 F (37.3 C)] 98.7 F (37.1 C) (02/11 0510) Pulse Rate:  [87-110] 87 (02/11 0510) Resp:  [18] 18 (02/11 0510) BP: (157-192)/(85-97) 168/97 (02/11 0510) SpO2:  [82 %-99 %] 91 % (02/11 0510) Physical Exam: General: 69 year old female in no acute distress Cardiovascular: Regular rate and rhythm, no murmurs or gallops appreciated Respiratory: Chest clear to auscultation bilaterally, diminished at bases, no creased work of breathing Abdomen: Soft, nontender, nondistended, bowel sounds present Extremities: No lower extremity edema  Laboratory: Recent Labs  Lab 07/14/19 0138 07/14/19 0618 07/15/19 0538  WBC 5.8 7.2 7.4  HGB 16.5* 15.1* 14.4  HCT 51.1* 45.5 43.7  PLT 101* 124* 106*   Recent Labs  Lab 07/14/19 0138 07/14/19 0618 07/15/19 0538  NA 133*  --  133*  K 4.5  --  3.9  CL 98  --  97*  CO2 20*  --  28  BUN 7*  --  <5*  CREATININE 0.58 0.74 0.55  CALCIUM 8.7*  --  8.5*  PROT 7.5  --  6.4*  BILITOT 1.1  --  1.3*  ALKPHOS 88  --  69  ALT 65*  --  39  AST 168*  --  64*  GLUCOSE 116*  --  97      Imaging/Diagnostic Tests: No results found.  Carollee Leitz, MD 07/16/2019, 6:17 AM PGY-1,  [place] Intern pager: [phone], text pages welcome

## 2019-07-16 NOTE — Procedures (Signed)
Bronchoscopy Procedure Note ISIDORA LAHAM [id number] 06-May-1951  Procedure: Bronchoscopy Indications: Diagnostic evaluation of the airways and Obtain specimens for culture and/or other diagnostic studies  Procedure Details Consent: Risks of procedure as well as the alternatives and risks of each were explained to the (patient/caregiver).  Consent for procedure obtained. Time Out: Verified patient identification, verified procedure, site/side was marked, verified correct patient position, special equipment/implants available, medications/allergies/relevent history reviewed, required imaging and test results available.  Performed  In preparation for procedure, bronchoscope lubricated. Sedation: versed 77m fentanyl 100 mcg in divided doses over 15 mins   Written informed consent was obtained prior to th eprocedure. The risks of the procedure including coughing, bleeding and the small chance of lung puncture requiring chest tube were discussed in great detail. The benefits & alternatives including serial follow up were also discussed.  6 mg versed &  100 mcg fentnayl used in divided doses. Nasal bleeding noted even prior to introducing scope Bronchoscope entered from the mouth . Sharp broken incisor noted with gum bleding Upper airway nml Vocal cords showed nml appearance & motion. Trachea & RT  bronchial tree examined to the subsegmental level. Bloody secretions in lower airways suctioned out. NO endobronchial lesion noted in RLL  Procedure aborted without visualising LT airways due to desaturation  Evaluation Hemodynamic Status: Transient hypertension; O2 sats: transiently fell during during procedure currently normal  Patient's Current Condition: stable Specimens:  BAL for culture & cytology Complications: No apparent complications Patient did tolerate procedure well.   RLeanna SatoAElsworth Soho2/04/2020

## 2019-07-17 ENCOUNTER — Inpatient Hospital Stay (HOSPITAL_COMMUNITY): Payer: Medicare Other

## 2019-07-17 DIAGNOSIS — R0902 Hypoxemia: Secondary | ICD-10-CM

## 2019-07-17 DIAGNOSIS — J432 Centrilobular emphysema: Secondary | ICD-10-CM

## 2019-07-17 LAB — CBC
HCT: 41.1 % (ref 36.0–46.0)
Hemoglobin: 13.2 g/dL (ref 12.0–15.0)
MCH: 32 pg (ref 26.0–34.0)
MCHC: 32.1 g/dL (ref 30.0–36.0)
MCV: 99.5 fL (ref 80.0–100.0)
Platelets: 121 10*3/uL — ABNORMAL LOW (ref 150–400)
RBC: 4.13 MIL/uL (ref 3.87–5.11)
RDW: 13.5 % (ref 11.5–15.5)
WBC: 5.9 10*3/uL (ref 4.0–10.5)
nRBC: 0 % (ref 0.0–0.2)

## 2019-07-17 LAB — BASIC METABOLIC PANEL
Anion gap: 10 (ref 5–15)
BUN: <5 mg/dL — ABNORMAL LOW (ref 8–23)
CO2: 26 mmol/L (ref 22–32)
Calcium: 8.5 mg/dL — ABNORMAL LOW (ref 8.9–10.3)
Chloride: 99 mmol/L (ref 98–111)
Creatinine, Ser: 0.48 mg/dL (ref 0.44–1.00)
GFR calc Af Amer: >60 mL/min (ref >60)
GFR calc non Af Amer: >60 mL/min (ref >60)
Glucose, Bld: 72 mg/dL (ref 70–99)
Potassium: 4.4 mmol/L (ref 3.5–5.1)
Sodium: 135 mmol/L (ref 135–145)

## 2019-07-17 LAB — CYTOLOGY - NON PAP

## 2019-07-17 IMAGING — DX DG CHEST 1V PORT
1 series · 1 of 1 positions shown · non-contrast
Comparison: Chest CT [DATE] and earlier.

CLINICAL DATA: 68-year-old female with respiratory failure. Right
lower lobe collapse on recent chest CT.

EXAM:
PORTABLE CHEST 1 VIEW

[chest]
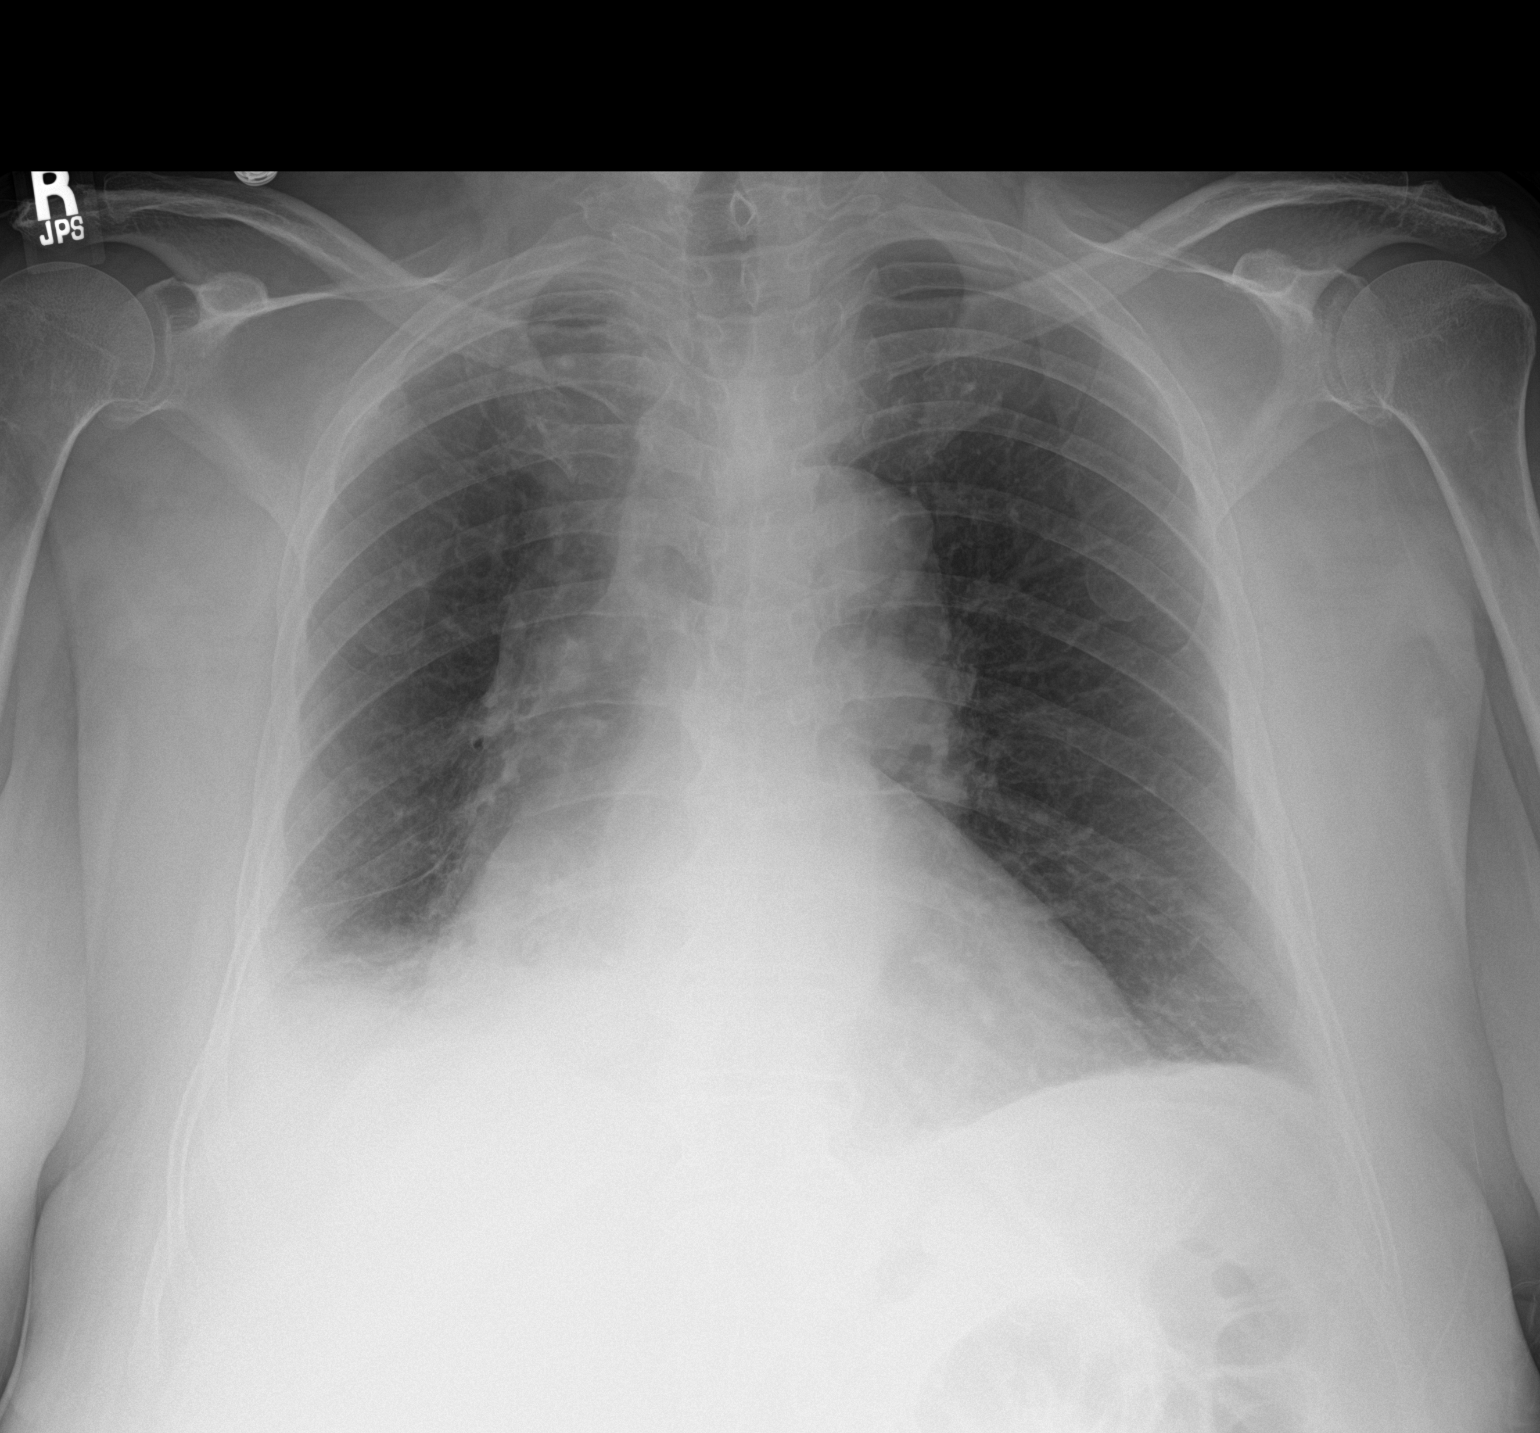

[1 of 1 positions shown; findings below may reference images not displayed]

FINDINGS: Portable AP semi upright view at [XN] hours. Continued confluent
right lung base opacity and decreased right lung volume with slight
rightward shift of the mediastinum. Compared to radiographs on
[DATE] there has been minimal improved right lung base
ventilation. No superimposed pneumothorax, pulmonary edema or
definite pleural effusion. The left lung remains clear. Non dilated
bowel in the left upper quadrant. No acute osseous abnormality
identified.
IMPRESSION: Minimally improved right lower lobe collapse/drowned lung from the
recent CT. No new cardiopulmonary abnormality.

## 2019-07-17 MED ORDER — AMLODIPINE BESYLATE 10 MG PO TABS
10.0000 mg | ORAL_TABLET | Freq: Every day | ORAL | Status: DC
  Administered 2019-07-18: 10 mg via ORAL
  Filled 2019-07-17: qty 1

## 2019-07-17 MED ORDER — AMLODIPINE BESYLATE 5 MG PO TABS
5.0000 mg | ORAL_TABLET | Freq: Once | ORAL | Status: AC
  Administered 2019-07-17: 5 mg via ORAL

## 2019-07-17 MED ORDER — UMECLIDINIUM BROMIDE 62.5 MCG/INH IN AEPB
1.0000 | INHALATION_SPRAY | Freq: Every day | RESPIRATORY_TRACT | Status: DC
  Filled 2019-07-17: qty 7

## 2019-07-17 MED ORDER — IPRATROPIUM-ALBUTEROL 0.5-2.5 (3) MG/3ML IN SOLN
3.0000 mL | Freq: Four times a day (QID) | RESPIRATORY_TRACT | Status: DC
  Administered 2019-07-17: 3 mL via RESPIRATORY_TRACT
  Filled 2019-07-17: qty 3

## 2019-07-17 MED ORDER — AMLODIPINE BESYLATE 5 MG PO TABS
5.0000 mg | ORAL_TABLET | Freq: Every day | ORAL | Status: DC
  Administered 2019-07-17: 5 mg via ORAL
  Filled 2019-07-17 (×2): qty 1

## 2019-07-17 MED ORDER — IPRATROPIUM-ALBUTEROL 0.5-2.5 (3) MG/3ML IN SOLN
3.0000 mL | Freq: Two times a day (BID) | RESPIRATORY_TRACT | Status: DC
  Administered 2019-07-17: 3 mL via RESPIRATORY_TRACT
  Filled 2019-07-17 (×2): qty 3

## 2019-07-17 NOTE — Progress Notes (Signed)
Pharmacy Antibiotic Note  Natasha Holland is a 68 y.o. female admitted on 07/13/2019 with abdominal pain.  CT abd noted atelectasis / possible infectious process in lungs and patient was started on Unasyn.  Pt is s/p bronc 2/12 with BAL cx pending.  Today is day #3 of Unasyn for empiric asp pna coverage.  Noted plan to discharge patient on Augmentin to complete 7 day course.   Plan: Continue Unasyn 3gm IV q6h. Recommend Augmentin 844m PO BID to complete 7d of therapy. Pharmacy will sign off.  Height: _0  (157.5 cm) Weight: 140 lb 14 oz (63.9 kg) IBW/kg (Calculated) : 50.1  Temp (24hrs), Avg:98.2 F (36.8 C), Min:97.7 F (36.5 C), Max:98.9 F (37.2 C)  Recent Labs  Lab 07/14/19 0138 07/14/19 0618 07/15/19 0538 07/16/19 0709 07/17/19 0350  WBC 5.8 7.2 7.4 7.8 5.9  CREATININE 0.58 0.74 0.55 0.68 0.48    Estimated Creatinine Clearance: 58.3 mL/min (by C-G formula based on SCr of 0.48 mg/dL).    No Known Allergies Thank you for allowing pharmacy to be a part of this patient's care.  [employer] Pharm.D., BCPS Clinical Pharmacist  **Pharmacist phone directory can be found on [email or web address] listed under [place]  07/17/2019 4:38 PM

## 2019-07-17 NOTE — Progress Notes (Signed)
1 Day Post-Op   Subjective/Chief Complaint: Tol diet, having flatus, no abd pain   Objective: Vital signs in last 24 hours: Temp:  [97.7 F (36.5 C)-101.5 F (38.6 C)] 97.8 F (36.6 C) (02/12 0507) Pulse Rate:  [93-139] 109 (02/12 0517) Resp:  [13-25] 18 (02/12 0508) BP: (107-219)/(73-117) 172/98 (02/12 0533) SpO2:  [92 %-100 %] 92 % (02/12 0511) Weight:  [63.9 kg] 63.9 kg (02/11 2100) Last BM Date: 07/13/19  Intake/Output from previous day: 02/11 0701 - 02/12 0700 In: 1313.9 [P.O.:580; I.V.:333.9; IV Piggyback:400] Out: 0  Intake/Output this shift: No intake/output data recorded.  GI: soft nontender nondistended  Lab Results:  Recent Labs    07/16/19 0709 07/17/19 0350  WBC 7.8 5.9  HGB 13.8 13.2  HCT 42.3 41.1  PLT 106* 121*   BMET Recent Labs    07/16/19 0709 07/17/19 0350  NA 137 135  K 3.4* 4.4  CL 92* 99  CO2 32 26  GLUCOSE 86 72  BUN <5* <5*  CREATININE 0.68 0.48  CALCIUM 8.7* 8.5*   PT/INR No results for input(s): LABPROT, INR in the last 72 hours. ABG No results for input(s): PHART, HCO3 in the last 72 hours.  Invalid input(s): PCO2, PO2  Studies/Results: No results found.  Anti-infectives: Anti-infectives (From admission, onward)   Start     Dose/Rate Route Frequency Ordered Stop   07/14/19 1845  Ampicillin-Sulbactam (UNASYN) 3 g in sodium chloride 0.9 % 100 mL IVPB     3 g 200 mL/hr over 30 Minutes Intravenous Every 6 hours 07/14/19 1832        Assessment/Plan: Pancreatitis likely secondary to ETOH abuse ?extraluminal air near splenic flexure -abdominal pain resolved.  Tolerating diet -unclear what this etiology is -can dc home from my standpoint -might be reasonable to refer for colonoscopy as outpatient FEN -regular VTE -lovenox   Rolm Bookbinder 07/17/2019

## 2019-07-17 NOTE — Progress Notes (Signed)
Family Medicine Teaching Service Daily Progress Note Intern Pager: [phone]  Patient name: Natasha Holland Medical record number: [id number] Date of birth: Jan 20, 1951 Age: 69 y.o. Gender: female  Primary Care Provider: Patient, No Pcp Per Consultants: None Code Status: DNI  Pt Overview and Major Events to Date:  02/09-admitted  Assessment and Plan: RAIA VANASSE is a 69 y.o. female presenting with abdominal pain. PMH is significant for HTN, hyperglycemia, and ETOH abuse.   Acute Pancreatitis likely secondary to ETOH Stable overnight.  Pain well controlled.  Denies any abdominal pain.  Abdominal exam benign. -GI following, appreciate recommendations -Surgery signed off 02/12 -DC IV fluids as patient tolerating p.o.diet -Continue Tylenol every 6h  CT abdo showed atelectasis/infectious process Bronchoscopy yesterday did not show any endobronchial lesion or foreign body.  Denies any shortness of breath, chest pain, chills or fevers. -CCM following, appreciate recommendations -Continue Unasyn per CCM -[place] toileting -Monitor respiratory status  ETOH Abuse Last EtOH 02/08.  CIWA score 0 overnight.  Denies any headaches, auditory or visual hallucinations.  On exam patient is alert and orientated x3 and nontremulous. -Continue folic acid, vitamin C and thiamine -Continue CIWA  HTN  Systolic BPs running [id number] -Increase Norvasc to 10 mg daily  Hx of hyperglycemia HbA1c 4.9.  Serum glucose 86 -Continue to monitor  GOC: does not currently have pcp and would llike to start seeing one regularly -consult TOC for pcp  FEN/GI:  -Advance diet as tolerated Prophylaxis:  -Lovenox 40 mg subcu  Disposition: Home when medically stable  Subjective:  No acute events overnight.  Reports no chest pain, shortness of breath. States that abdominal pain has much improved.  Denies any fever or chills.  Appetite good and tolerating solid foods.   Objective: Temp:  [97.7 F (36.5 C)-101.5 F (38.6  C)] 97.8 F (36.6 C) (02/12 0507) Pulse Rate:  [93-139] 109 (02/12 0517) Resp:  [13-25] 18 (02/12 0508) BP: (107-219)/(73-117) 172/98 (02/12 0533) SpO2:  [92 %-100 %] 92 % (02/12 0511) Weight:  [63.9 kg] 63.9 kg (02/11 2100) Physical Exam:  General: 69 year old female in no acute distress Cardiovascular: Regular rate and rhythm, no murmurs or gallops appreciated Respiratory: Chest clear to auscultation bilaterally, diminished at bases, no creased work of breathing Abdomen: Soft, nontender, nondistended, bowel sounds present Extremities: No lower extremity edema  Laboratory: Recent Labs  Lab 07/15/19 0538 07/16/19 0709 07/17/19 0350  WBC 7.4 7.8 5.9  HGB 14.4 13.8 13.2  HCT 43.7 42.3 41.1  PLT 106* 106* 121*   Recent Labs  Lab 07/14/19 0138 07/14/19 0618 07/15/19 0538 07/16/19 0709 07/17/19 0350  NA 133*   < > 133* 137 135  K 4.5   < > 3.9 3.4* 4.4  CL 98   < > 97* 92* 99  CO2 20*   < > 28 32 26  BUN 7*   < > <5* <5* <5*  CREATININE 0.58   < > 0.55 0.68 0.48  CALCIUM 8.7*   < > 8.5* 8.7* 8.5*  PROT 7.5  --  6.4*  --   --   BILITOT 1.1  --  1.3*  --   --   ALKPHOS 88  --  69  --   --   ALT 65*  --  39  --   --   AST 168*  --  64*  --   --   GLUCOSE 116*   < > 97 86 72   < > = values in this interval  not displayed.     Imaging/Diagnostic Tests: No results found.  Carollee Leitz, MD 07/17/2019, 6:29 AM PGY-1, [place] Intern pager: [phone], text pages welcome

## 2019-07-17 NOTE — Plan of Care (Signed)
  Problem: Activity: Goal: Activity intolerance will improve Outcome: Progressing   

## 2019-07-17 NOTE — Progress Notes (Signed)
   NAME:  Natasha Holland, MRN:  [id number], DOB:  [birth year], LOS: 3 ADMISSION DATE:  07/13/2019, CONSULTATION DATE:  2/9 REFERRING MD:  Teaching service, CHIEF COMPLAINT:  RLL collapase   Brief History   69 year old female with ETOH history admitted for acute pancreatitis. Incidentally found to have RLL collapse on CXR and PCCM consulted.   Past Medical History    has a past medical history of Hypertension and Polycythemia vera(238.4).  Significant Hospital Events   07/13/2019  - admit  Consults:  PCCM CCS  Procedures:    Significant Diagnostic Tests:  CT chest/abd/pel 2/9 > Extraluminal gas in left upper abdomen centered around the sigmoid colon and splenic flexure area. Sigmoid colon is distended with gas in this area. Complete collapse of the right lower lobe. Etiology for the collapse is unknown. An obstructing endobronchial lesion cannot be excluded. Consider further characterization with bronchoscopy. Small amount of volume loss in the right middle lobe.  MRCP 2/10 >  Micro Data:    Antimicrobials:  Unasyn 2/9 >   Interim history/subjective:  S/p bronchoscopy. Denies shortness of breath or chest pain. Still has chest congestion with intermittent cough.  Objective   Blood pressure (!) 170/93, pulse 94, temperature 98.4 F (36.9 C), temperature source Oral, resp. rate 18, height 5\' 2"  (1.575 m), weight 63.9 kg, SpO2 95 %.        Intake/Output Summary (Last 24 hours) at 07/17/2019 1005 Last data filed at 07/17/2019 0900 Gross per 24 hour  Intake 1433.92 ml  Output 0 ml  Net 1433.92 ml   Filed Weights   07/14/19 1523 07/15/19 2033 07/16/19 2100  Weight: 62 kg 62 kg 63.9 kg   Physical Exam: General: Frail, older that stated age, elderly-appearing, no acute distress HENT: [place], AT, OP clear, MMM Eyes: EOMI, no scleral icterus Respiratory: Diminished breath sounds bilaterally.  No crackles, wheezing or rales Cardiovascular: RRR, -M/R/G, no  JVD Extremities:-Edema,-tenderness Neuro: AAO x4, CNII-XII grossly intact Psych: Normal mood, normal affect  CXR 07/17/19 - Similar, slightly improved RLL collapse  Resolved Hospital Problem list     Assessment & Plan:   Right lower lobe collapse in the setting of baseline previous smoking, ongoing alcohol history of poor dentition. She has recently been vomiting in the setting of acute pancreatitis. S/p bronchoscopy 2/11 with no evidence of endobronchial lesion. RLL lung collapse likely secondary to mucous plugging with probable aspiration.   Plan -Agree with Unasyn -Follow-up final respiratory culture -Check for urine Streptococcus and urine Legionella  -Pulmonary toilet including: scheduled duonebs x 48 hours, frequent acapella use (q1h)  Centrilobular emphysema Significant tobacco history >40pack years -Start Incruse ONE puff daily to be continued as outpatient -Consider outpatient PFTs with PCP -Patient declined pulmonary outpatient evaluation  Rodman Pickle, M.D. [facility] Pulmonary/Critical Care Medicine 07/17/2019 10:06 AM   Please see Amion for pager number to reach on-call Pulmonary and Critical Care Team.  Pulmonary will sign off. Please call for any questions or concerns.

## 2019-07-17 NOTE — Discharge Summary (Addendum)
[facility] Discharge Summary  Patient name: Natasha Holland Medical record number: [id number] Date of birth: 05-Jul-1950 Age: 69 y.o. Gender: female Date of Admission: 07/13/2019  Date of Discharge: 07/18/2019 Admitting Physician: Sherene Sires, DO  Primary Care Provider: Patient, No Pcp Per Consultants: GI, surgery, pulmonology  Indication for Hospitalization: Acute pancreatitis  Discharge Diagnoses/Problem List:  Acute pancreatitis Emphysema Hypertension  Disposition: Home  Discharge Condition: Stable  Discharge Exam: 07/18/2019  General: 69 year old female no acute distress Cardiovascular: Regular rate and rhythm, no murmurs rubs appreciated Respiratory: Chest clear to auscultation bilaterally, diminished at bases but no increased work of breathing Abdomen: Soft, nontender, nondistended.  Bowel sounds present Extremities: No lower extremity edema  Brief Hospital Course:   Abdominal pain Patient presented to the ED with complaints of lower abdominal pain x1 day.  Labs are significant for hemoglobin 16.5 platelets 101 AST 168 ALT 69 and lipase 979.  She was treated for acute appendicitis secondary to EtOH.  Findings on CT abdomen/pelvis consistent with acute pancreatitis with diffuse inflammatory with no evidence of pancreatic necrosis or pseudocyst.  Also identified mildly dilated distal common bile duct at the level of the ampulla but no calcified stone seen.  Hepatic steatosis and inflammatory changes surrounding the first and second portion of the duodenum.  GI was consulted to evaluate for dilated CBD.  MRCP signficant for acute pancreatitis or pancreatic necrosis or hemorrhage but no stone causing a blockage of the common bile duct.  No peripancreatic fluid collection/pseudocyst.  Secondary wall thickening involving the duodenum suggestive of duodenitis.  No intrahepatic or extrahepatic duct dilatation.  No cholelithiasis or choledocholithiasis was seen.   CT chest also showed extraluminal gas in the left upper abdomen which appeared to be confined to the pericolonic tissue or mesocolon. Findings could be related to bowel perforation but was indeterminate.  Surgery was consulted to evaluate and are unable to determine the cause of extraluminal air.  Serial abdominal exams continue to be benign therefore no surgical intervention was needed. She was initially managed with IV fluids and pain control and she slowly improved and tolerated advancement of her diet with resolution of nausea and vomiting. She received 4 L of fluid was given Lasix 20 mg IV prior to discharge.  Diet was advanced as tolerated and on the day of discharge she was able to tolerate solid foods.  Centrilobular emphysema CT abdomen findings impressive for patchy airspace consolidation with air bronchograms seen at the posterior right lung base which could be due to atelectasis or infectious etiology.  Chest x-ray confirmed dense bibasilar consolidation at the right lung base and suggested CT chest for further evaluation.  CT chest showed complete collapse of the right lower lobe for which etiology was unknown.  Also obstructing endobronchial lesion could not be excluded.  Suggested bronchoscopy.  Pulmonology was consulted bronchoscopy was done on 02/11 and no endobronchial lesion was noted in the right lower lobe.  Cytology showed no malignant cells identified but no acute inflammation present.  She was started on ampicillin for suspected aspiration pneumonia and transition to p.o. Augmentin on discharge.  Hypertension Blood pressure is elevated on admission with systolics [id number].  Norvasc 10 mg initiated and will need to follow-up with PCP for further management.  issues for Follow Up:  1. Follow-up with PCP for blood pressure control, initiated Norvasc 10 mg daily 2. Encourage cessation of EtOH 3. Repeat chest x-ray 4 weeks 4. Repeat Bmet in one week 5.Consider outpatient  pulmonology,  patient declined while in hospital but would   Recommend PCP to rapproche this.   6. Consider outpatient PFT's  Significant Procedures:  02/11-bronchoscopy  Significant Labs and Imaging:  Recent Labs  Lab 07/16/19 0709 07/17/19 0350 07/18/19 0504  WBC 7.8 5.9 5.5  HGB 13.8 13.2 14.0  HCT 42.3 41.1 42.1  PLT 106* 121* 159   Recent Labs  Lab 07/14/19 0138 07/14/19 0138 07/14/19 0618 07/15/19 0538 07/15/19 0538 07/15/19 0558 07/16/19 0709 07/16/19 0709 07/17/19 0350 07/18/19 0504  NA 133*  --   --  133*  --   --  137  --  135 134*  K 4.5   < >  --  3.9   < >  --  3.4*   < > 4.4 3.3*  CL 98  --   --  97*  --   --  92*  --  99 96*  CO2 20*  --   --  28  --   --  32  --  26 25  GLUCOSE 116*  --   --  97  --   --  86  --  72 94  BUN 7*  --   --  <5*  --   --  <5*  --  <5* <5*  CREATININE 0.58   < > 0.74 0.55  --   --  0.68  --  0.48 0.62  CALCIUM 8.7*  --   --  8.5*  --   --  8.7*  --  8.5* 8.8*  MG  --   --  1.7  --   --  1.8  --   --   --   --   PHOS  --   --  4.6  --   --   --   --   --   --   --   ALKPHOS 88  --   --  69  --   --   --   --   --   --   AST 168*  --   --  64*  --   --   --   --   --   --   ALT 65*  --   --  39  --   --   --   --   --   --   ALBUMIN 3.2*  --   --  2.6*  --   --   --   --   --   --    < > = values in this interval not displayed.      Results/Tests Pending at Time of Discharge: Urine for Strep Pneumonia and Legionella pending  Discharge Medications:  Allergies as of 07/18/2019   No Known Allergies     Medication List    TAKE these medications   amLODipine 10 MG tablet Commonly known as: NORVASC Take 1 tablet (10 mg total) by mouth daily.   amoxicillin-clavulanate 875-125 MG tablet Commonly known as: Augmentin Take 1 tablet by mouth 2 (two) times daily for 5 days.   folic acid 1 MG tablet Commonly known as: FOLVITE Take 1 tablet (1 mg total) by mouth daily.   Ipratropium-Albuterol 20-100 MCG/ACT Aers  respimat Commonly known as: COMBIVENT Inhale 1 puff into the lungs in the morning and at bedtime.   multivitamin with minerals Tabs tablet Take 1 tablet by mouth daily.   thiamine 100 MG tablet Take 1 tablet (100 mg total) by  mouth daily.   umeclidinium bromide 62.5 MCG/INH Aepb Commonly known as: INCRUSE ELLIPTA Inhale 1 puff into the lungs daily. Start taking on: July 19, 2019       Discharge Instructions: Please refer to Patient Instructions section of EMR for full details.  Patient was counseled important signs and symptoms that should prompt return to medical care, changes in medications, dietary instructions, activity restrictions, and follow up appointments.   Follow-Up Appointments:   Carollee Leitz, MD 07/18/2019, 2:36 PM PGY-1, [place]  Resident Attestation   I saw and evaluated the patient, performing the key elements of the service. I personally performed or re-performed the history, physical exam, and medical decision making activities of this service and have verified that the service and findings are accurately documented in the resident's note. I developed the management plan that is described in the resident's note, and I agree with the content, with my edits above in red.   Harolyn Rutherford, DO [facility], PGY-3

## 2019-07-17 NOTE — Progress Notes (Signed)
Patient sitting on bed on room air sat was 90%.  Patient stood up and walked 50 feet and o2 sat dropped to 85%.  Got patient back to bed and o2 sat was at 77%.  Placed 2L o2 on patient and sats went to 91%.

## 2019-07-17 NOTE — Discharge Instructions (Signed)
Acute Pancreatitis    Acute pancreatitis happens when the pancreas gets swollen. The pancreas is a large gland in the body that helps to control blood sugar. It also makes enzymes that help to digest food.  This condition can last a few days and cause serious problems. The lungs, heart, and kidneys may stop working.  What are the causes?  Causes include:  · Alcohol abuse.  · Drug abuse.  · Gallstones.  · A tumor in the pancreas.  Other causes include:  · Some medicines.  · Some chemicals.  · Diabetes.  · An infection.  · Damage caused by an accident.  · The poison (venom) from a scorpion bite.  · Belly (abdominal) surgery.  · The body's defense system (immune system) attacking the pancreas (autoimmune pancreatitis).  · Genes that are passed from parent to child (inherited).  In some cases, the cause is not known.  What are the signs or symptoms?  · Pain in the upper belly that may be felt in the back. The pain may be very bad.  · Swelling of the belly.  · Feeling sick to your stomach (nauseous) and throwing up (vomiting).  · Fever.  How is this treated?  You will likely have to stay in the hospital. Treatment may include:  · Pain medicine.  · Fluid through an IV tube.  · Placing a tube in the stomach to take out the stomach contents. This may help you stop throwing up.  · Not eating for 3-4 days.  · Antibiotic medicines, if you have an infection.  · Treating any other problems that may be the cause.  · Steroid medicines, if your problem is caused by your defense system attacking your body's own tissues.  · Surgery.  Follow these instructions at home:  Eating and drinking    · Follow instructions from your doctor about what to eat and drink.  · Eat foods that do not have a lot of fat in them.  · Eat small meals often. Do not eat big meals.  · Drink enough fluid to keep your pee (urine) pale yellow.  · Do not drink alcohol if it caused your condition.  Medicines  · Take over-the-counter and prescription medicines only  as told by your doctor.  · Ask your doctor if the medicine prescribed to you:  ? Requires you to avoid driving or using heavy machinery.  ? Can cause trouble pooping (constipation). You may need to take steps to prevent or treat trouble pooping:  § Take over-the-counter or prescription medicines.  § Eat foods that are high in fiber. These include beans, whole grains, and fresh fruits and vegetables.  § Limit foods that are high in fat and sugar. These include fried or sweet foods.  General instructions  · Do not use any products that contain nicotine or tobacco, such as cigarettes, e-cigarettes, and chewing tobacco. If you need help quitting, ask your doctor.  · Get plenty of rest.  · Check your blood sugar at home as told by your doctor.  · Keep all follow-up visits as told by your doctor. This is important.  Contact a doctor if:  · You do not get better as quickly as expected.  · You have new symptoms.  · Your symptoms get worse.  · You have pain or weakness that lasts a long time.  · You keep feeling sick to your stomach.  · You get better and then you have pain again.  ·   You have a fever.  Get help right away if:  · You cannot eat or keep fluids down.  · Your pain gets very bad.  · Your skin or the white part of your eyes turns yellow.  · You have sudden swelling in your belly.  · You throw up.  · You feel dizzy or you pass out (faint).  · Your blood sugar is high (over 300 mg/dL).  Summary  · Acute pancreatitis happens when the pancreas gets swollen.  · This condition is often caused by alcohol abuse, drug abuse, or gallstones.  · You will likely have to stay in the hospital for treatment.  This information is not intended to replace advice given to you by your health care provider. Make sure you discuss any questions you have with your health care provider.  Document Revised: 03/10/2018 Document Reviewed: 03/10/2018  Elsevier Patient Education © 2020 [employer] Inc[employer]

## 2019-07-18 DIAGNOSIS — J42 Unspecified chronic bronchitis: Secondary | ICD-10-CM

## 2019-07-18 DIAGNOSIS — J690 Pneumonitis due to inhalation of food and vomit: Secondary | ICD-10-CM

## 2019-07-18 LAB — CBC
HCT: 42.1 % (ref 36.0–46.0)
Hemoglobin: 14 g/dL (ref 12.0–15.0)
MCH: 31.7 pg (ref 26.0–34.0)
MCHC: 33.3 g/dL (ref 30.0–36.0)
MCV: 95.5 fL (ref 80.0–100.0)
Platelets: 159 10*3/uL (ref 150–400)
RBC: 4.41 MIL/uL (ref 3.87–5.11)
RDW: 13.4 % (ref 11.5–15.5)
WBC: 5.5 10*3/uL (ref 4.0–10.5)
nRBC: 0 % (ref 0.0–0.2)

## 2019-07-18 LAB — STREP PNEUMONIAE URINARY ANTIGEN: Strep Pneumo Urinary Antigen: NEGATIVE

## 2019-07-18 LAB — BASIC METABOLIC PANEL
Anion gap: 13 (ref 5–15)
BUN: <5 mg/dL — ABNORMAL LOW (ref 8–23)
CO2: 25 mmol/L (ref 22–32)
Calcium: 8.8 mg/dL — ABNORMAL LOW (ref 8.9–10.3)
Chloride: 96 mmol/L — ABNORMAL LOW (ref 98–111)
Creatinine, Ser: 0.62 mg/dL (ref 0.44–1.00)
GFR calc Af Amer: >60 mL/min (ref >60)
GFR calc non Af Amer: >60 mL/min (ref >60)
Glucose, Bld: 94 mg/dL (ref 70–99)
Potassium: 3.3 mmol/L — ABNORMAL LOW (ref 3.5–5.1)
Sodium: 134 mmol/L — ABNORMAL LOW (ref 135–145)

## 2019-07-18 LAB — CULTURE, RESPIRATORY W GRAM STAIN: Culture: NORMAL

## 2019-07-18 MED ORDER — AMLODIPINE BESYLATE 10 MG PO TABS: 10.0000 mg | ORAL_TABLET | Freq: Every day | ORAL | 1 refills | Status: DC

## 2019-07-18 MED ORDER — IPRATROPIUM-ALBUTEROL 20-100 MCG/ACT IN AERS: 1.0000 | INHALATION_SPRAY | Freq: Two times a day (BID) | RESPIRATORY_TRACT | 3 refills | Status: AC

## 2019-07-18 MED ORDER — POTASSIUM CHLORIDE CRYS ER 20 MEQ PO TBCR
40.0000 meq | EXTENDED_RELEASE_TABLET | ORAL | Status: AC
  Administered 2019-07-18 (×2): 40 meq via ORAL
  Filled 2019-07-18 (×2): qty 2

## 2019-07-18 MED ORDER — UMECLIDINIUM BROMIDE 62.5 MCG/INH IN AEPB: 1.0000 | INHALATION_SPRAY | Freq: Every day | RESPIRATORY_TRACT | 3 refills | Status: DC

## 2019-07-18 MED ORDER — FOLIC ACID 1 MG PO TABS: 1.0000 mg | ORAL_TABLET | Freq: Every day | ORAL | 1 refills | Status: DC

## 2019-07-18 MED ORDER — ADULT MULTIVITAMIN W/MINERALS CH: 1.0000 | ORAL_TABLET | Freq: Every day | ORAL | 1 refills | Status: DC

## 2019-07-18 MED ORDER — AMOXICILLIN-POT CLAVULANATE 875-125 MG PO TABS: 1.0000 | ORAL_TABLET | Freq: Two times a day (BID) | ORAL | 0 refills | Status: AC

## 2019-07-18 MED ORDER — FUROSEMIDE 10 MG/ML IJ SOLN
20.0000 mg | Freq: Once | INTRAMUSCULAR | Status: AC
  Administered 2019-07-18: 20 mg via INTRAVENOUS
  Filled 2019-07-18: qty 2

## 2019-07-18 MED ORDER — THIAMINE HCL 100 MG PO TABS: 100.0000 mg | ORAL_TABLET | Freq: Every day | ORAL | 1 refills | Status: DC

## 2019-07-18 NOTE — Progress Notes (Signed)
Family Medicine Teaching Service Daily Progress Note Intern Pager: [phone]  Patient name: Natasha Holland Medical record number: [id number] Date of birth: Dec 06, 1950 Age: 69 y.o. Gender: female  Primary Care Provider: Patient, No Pcp Per Consultants: None Code Status: DNI  Pt Overview and Major Events to Date:  02/09-admitted 02/11-bronchoscopy  Assessment and Plan: Natasha Holland is a 69 y.o. female presenting with abdominal pain. PMH is significant for HTN, hyperglycemia, and ETOH abuse.   Acute Pancreatitis likely secondary to ETOH Resolved.  Denies any abdominal pain.  Abdominal exam benign -GI following patient recommendations -Tylenol every 6 hours  Centrilobular emphysema Patient reports no increased shortness of breath.  Lungs clear. -Pulmonology following, appreciate recommendations -Switch Unasyn to Augmentin daily for 10 days -Continue Incruse 1 puff daily  ETOH Abuse Stable. -Continue to monitor  HTN  Systolic blood pressures [id number].  Denies any headaches, blurry vision or chest pain -Continue Norvasc to 10 mg daily  FEN/GI:  -Regular diet Prophylaxis:  -Lovenox 40 mg subcu  Disposition: Tentative discharge today  Subjective:  No acute events overnight.  Tolerating p.o. diet.  Denies any chest pain, shortness of breath or abdominal pain.  Passing gas but has not had bowel movement.  Objective: Temp:  [98.4 F (36.9 C)-99.3 F (37.4 C)] 98.7 F (37.1 C) (02/13 0559) Pulse Rate:  [94-122] 95 (02/13 0559) Resp:  [15-20] 20 (02/13 0559) BP: (167-189)/(78-119) 167/95 (02/13 0559) SpO2:  [91 %-96 %] 94 % (02/13 0559) Weight:  [64.9 kg] 64.9 kg (02/12 2058) Physical Exam: General: 69 year old female no acute distress Cardiovascular: Regular rate and rhythm, no murmurs rubs appreciated Respiratory: Chest clear to auscultation bilaterally, diminished at bases but no increased work of breathing Abdomen: Soft, nontender, nondistended.  Bowel sounds  present Extremities: No lower extremity edema  Laboratory: Recent Labs  Lab 07/16/19 0709 07/17/19 0350 07/18/19 0504  WBC 7.8 5.9 5.5  HGB 13.8 13.2 14.0  HCT 42.3 41.1 42.1  PLT 106* 121* 159   Recent Labs  Lab 07/14/19 0138 07/14/19 0618 07/15/19 0538 07/15/19 0538 07/16/19 0709 07/17/19 0350 07/18/19 0504  NA 133*   < > 133*   < > 137 135 134*  K 4.5   < > 3.9   < > 3.4* 4.4 3.3*  CL 98   < > 97*   < > 92* 99 96*  CO2 20*   < > 28   < > 32 26 25  BUN 7*   < > <5*   < > <5* <5* <5*  CREATININE 0.58   < > 0.55   < > 0.68 0.48 0.62  CALCIUM 8.7*   < > 8.5*   < > 8.7* 8.5* 8.8*  PROT 7.5  --  6.4*  --   --   --   --   BILITOT 1.1  --  1.3*  --   --   --   --   ALKPHOS 88  --  69  --   --   --   --   ALT 65*  --  39  --   --   --   --   AST 168*  --  64*  --   --   --   --   GLUCOSE 116*   < > 97   < > 86 72 94   < > = values in this interval not displayed.     Imaging/Diagnostic Tests: No results found.  Carollee Leitz, MD 07/18/2019,  6:47 AM PGY-1, [place] Intern pager: [phone], text pages welcome

## 2019-07-18 NOTE — Plan of Care (Signed)
  Problem: Education: Goal: Knowledge of medication regimen will be met for pain relief regimen by discharge Outcome: Adequate for Discharge   Problem: Coping: Goal: Ability to verbalize feelings will improve by discharge 07/18/2019 1550 by Dolores Hoose, RN Outcome: Adequate for Discharge 07/18/2019 0757 by Dolores Hoose, RN Outcome: Progressing Goal: Family members realistic understanding of the patients condition will improve by discharge Outcome: Adequate for Discharge   Problem: Medication: Goal: Compliance with prescribed medication regimen will improve by discharge Outcome: Adequate for Discharge   Problem: Education: Goal: Knowledge of disease and its progression will improve 07/18/2019 1550 by Dolores Hoose, RN Outcome: Adequate for Discharge 07/18/2019 0757 by Dolores Hoose, RN Outcome: Progressing

## 2019-07-18 NOTE — Progress Notes (Addendum)
DISCHARGE NOTE  Ninfa Meeker to be discharged Home per MD order. Patient verbalized understanding.  Skin clean, dry and intact without evidence of skin break down, no evidence of skin tears noted. IV catheter discontinued intact. Site without signs and symptoms of complications. Dressing and pressure applied. Pt denies pain at the site currently. No complaints noted.  Patient free of lines, drains, and wounds.   Discharge packet assembled. An After Visit Summary (AVS) was printed and given to the patient at bedside. Patient escorted via wheelchair and discharged to designated family member via private transportation.  All questions and concerns addressed.   Dolores Hoose, RN

## 2019-07-18 NOTE — Progress Notes (Signed)
SATURATION QUALIFICATIONS: (This note is used to comply with regulatory documentation for home oxygen)  Patient Saturations on Room Air at Rest = 94 % Patient Saturations on Room Air while Ambulating = 92%  Patient Saturations on 2 Liters of oxygen while Ambulating = 97%  Please briefly explain why patient needs home oxygen:

## 2019-07-18 NOTE — Plan of Care (Signed)
  Problem: Coping: Goal: Ability to verbalize feelings will improve by discharge Outcome: Progressing   Problem: Education: Goal: Knowledge of disease and its progression will improve Outcome: Progressing

## 2019-07-19 LAB — LEGIONELLA PNEUMOPHILA SEROGP 1 UR AG: L. pneumophila Serogp 1 Ur Ag: NEGATIVE

## 2019-10-23 ENCOUNTER — Other Ambulatory Visit: Payer: Self-pay | Admitting: Family Medicine

## 2019-11-11 ENCOUNTER — Telehealth: Payer: Self-pay | Admitting: Family Medicine

## 2019-11-11 NOTE — Telephone Encounter (Signed)
Please advise 

## 2019-11-11 NOTE — Telephone Encounter (Signed)
Pt's niece, Bryn Gulling, would like for this pt to be established with Dr. Volanda Napoleon. Sending message back to confirm.   Pt can be reached at [phone] or [phone] -ok to leave a message

## 2019-11-25 NOTE — Telephone Encounter (Signed)
Ok

## 2019-11-26 NOTE — Telephone Encounter (Signed)
LVM on both phone numbers for pt to call to schedule a New patient appt.

## 2019-12-02 ENCOUNTER — Other Ambulatory Visit: Payer: Self-pay

## 2019-12-02 ENCOUNTER — Ambulatory Visit (INDEPENDENT_AMBULATORY_CARE_PROVIDER_SITE_OTHER): Payer: Medicare Other | Admitting: Family Medicine

## 2019-12-02 ENCOUNTER — Encounter: Payer: Self-pay | Admitting: Family Medicine

## 2019-12-02 VITALS — BP 146/84 | HR 106 | Temp 98.7°F | Wt 135.0 lb

## 2019-12-02 DIAGNOSIS — Z8719 Personal history of other diseases of the digestive system: Secondary | ICD-10-CM | POA: Diagnosis not present

## 2019-12-02 DIAGNOSIS — E559 Vitamin D deficiency, unspecified: Secondary | ICD-10-CM | POA: Diagnosis not present

## 2019-12-02 DIAGNOSIS — F1010 Alcohol abuse, uncomplicated: Secondary | ICD-10-CM

## 2019-12-02 DIAGNOSIS — R600 Localized edema: Secondary | ICD-10-CM | POA: Diagnosis not present

## 2019-12-02 DIAGNOSIS — I10 Essential (primary) hypertension: Secondary | ICD-10-CM

## 2019-12-02 DIAGNOSIS — R7989 Other specified abnormal findings of blood chemistry: Secondary | ICD-10-CM | POA: Diagnosis not present

## 2019-12-02 DIAGNOSIS — Z7689 Persons encountering health services in other specified circumstances: Secondary | ICD-10-CM

## 2019-12-02 DIAGNOSIS — D696 Thrombocytopenia, unspecified: Secondary | ICD-10-CM | POA: Diagnosis not present

## 2019-12-02 DIAGNOSIS — H04203 Unspecified epiphora, bilateral lacrimal glands: Secondary | ICD-10-CM

## 2019-12-02 DIAGNOSIS — R140 Abdominal distension (gaseous): Secondary | ICD-10-CM | POA: Diagnosis not present

## 2019-12-02 MED ORDER — FEXOFENADINE HCL 60 MG PO TABS: 60.0000 mg | ORAL_TABLET | Freq: Every day | ORAL | 1 refills | Status: DC

## 2019-12-02 NOTE — Patient Instructions (Addendum)
Take Allegra to see if that will help with your allergies.  I have sent a prescription to local pharmacy.  You should obtain a blood pressure.  To check your BP at home and keep a log to bring with you to clinic.  Alcohol Abuse and Nutrition Alcohol abuse is any pattern of alcohol consumption that harms your health, relationships, or work. Alcohol abuse can cause poor nutrition (malnutrition or malnourishment) and a lack of nutrients (nutrient deficiencies), which can lead to more complications. Alcohol abuse brings malnutrition and nutrient deficiencies in two ways:  It causes your liver to work abnormally. This affects how your body divides (breaks down) and absorbs nutrients from food.  It causes you to eat poorly. Many people who abuse alcohol do not eat enough carbohydrates, protein, fat, vitamins, and minerals. Nutrients that are commonly lacking (deficient) in people who abuse alcohol include:  Vitamins. ? Vitamin A. This is needed for your vision, metabolism, and ability to fight off infections (immunity). ? B vitamins. These include folate, thiamine, and niacin. These are needed for new cell growth. ? Vitamin C. This plays an important role in wound healing, immunity, and helping your body to absorb iron. ? Vitamin D. This is necessary for your body to absorb and use calcium. It is produced by your liver, but you can also get it from food and from sun exposure.  Minerals. ? Calcium. This is needed for healthy bones as well as heart and blood vessel (cardiovascular) function. ? Iron. This is important for blood, muscle, and nervous system functioning. ? Magnesium. This plays an important role in muscle and nerve function, and it helps to control blood sugar and blood pressure. ? Zinc. This is important for the normal functioning of your nervous system and digestive system (gastrointestinal tract). If you think that you have an alcohol dependency problem, or if it is hard to stop  drinking because you feel sick or different when you do not use alcohol, talk with your health care provider or another health professional about where to get help. Nutrition is an essential factor in therapy for alcohol abuse. Your health care provider or diet and nutrition specialist (dietitian) will work with you to design a plan that can help to restore nutrients to your body and prevent the risk of complications. What is my plan? Your dietitian may develop a specific eating plan that is based on your condition and any other problems that you have. An eating plan will commonly include:  A balanced diet. ? Grains: 6-8 oz (170-227 g) a day. Examples of 1 oz of whole grains include 1 cup of whole-wheat cereal,  cup of brown rice, or 1 slice of whole-wheat bread. ? Vegetables: 2-3 cups a day. Examples of 1 cup of vegetables include 2 medium carrots, 1 large tomato, or 2 stalks of celery. ? Fruits: 1-2 cups a day. Examples of 1 cup of fruit include 1 large banana, 1 small apple, 8 large strawberries, or 1 large orange. ? Meat and other protein: 5-6 oz (142-170 g) a day.  A cut of meat or fish that is the size of a deck of cards is about 3-4 oz.  Foods that provide 1 oz of protein include 1 egg,  cup of nuts or seeds, or 1 tablespoon (16 g) of peanut butter. ? Dairy: 2-3 cups a day. Examples of 1 cup of dairy include 8 oz (230 mL) of milk, 8 oz (230 g) of yogurt, or 1 oz (44 g)  of natural cheese.  Vitamin and mineral supplements. What are tips for following this plan?  Eat frequent meals and snacks. Try to eat 5-6 small meals each day.  Take vitamin or mineral supplements as recommended by your dietitian.  If you are malnourished or if your dietitian recommends it: ? You may follow a high-protein, high-calorie diet. This may include:  2,000-3,000 calories (kilocalories) a day.  70-100 g (grams) of protein a day. ? You may be directed to follow a diet that includes a complete nutritional  supplement beverage. This can help to restore calories, protein, and vitamins to your body. Depending on your condition, you may be advised to consume this beverage instead of your meals or in addition to them.  Certain medicines may cause changes in your appetite, taste, and weight. Work with your health care provider and dietitian to make any changes to your medicines and eating plan.  If you are unable to take in enough food and calories by mouth, your health care provider may recommend a feeding tube. This tube delivers nutritional supplements directly to your stomach. Recommended foods  Eat foods that are high in molecules that prevent oxygen from reacting with your food (antioxidants). These foods include grapes, berries, nuts, green tea, and dark green or orange vegetables. Eating these can help to prevent some of the stress that is placed on your liver by consuming alcohol.  Eat a variety of fresh fruits and vegetables each day. This will help you to get fiber and vitamins in your diet.  Drink plenty of water and other clear fluids, such as apple juice and broth. Try to drink at least 48-64 oz (1.5-2 L) of water a day.  Include foods fortified with vitamins and minerals in your diet. Commonly fortified foods include milk, orange juice, cereal, and bread.  Eat a variety of foods that are high in omega-3 and omega-6 fatty acids. These include fish, nuts and seeds, and soybeans. These foods may help your liver to recover and may also stabilize your mood.  If you are a vegetarian: ? Eat a variety of protein-rich foods. ? Pair whole grains with plant-based proteins at meals and snack time. For example, eat rice with beans, put peanut butter on whole-grain toast, or eat oatmeal with sunflower seeds. The items listed above may not be a complete list of foods and beverages you can eat. Contact a dietitian for more information. Foods to avoid  Avoid foods and drinks that are high in fat and  sugar. Sugary drinks, salty snacks, and candy contain empty calories. This means that they lack important nutrients such as protein, fiber, and vitamins.  Avoid alcohol. This is the best way to avoid malnutrition due to alcohol abuse. If you must drink, drink measured amounts. Measured drinking means limiting your intake to no more than 1 drink a day for nonpregnant women and 2 drinks a day for men. One drink equals 12 oz (355 mL) of beer, 5 oz (148 mL) of wine, or 1 oz (44 mL) of hard liquor.  Limit your intake of caffeine. Replace drinks like coffee and black tea with decaffeinated coffee and decaffeinated herbal tea. The items listed above may not be a complete list of foods and beverages you should avoid. Contact a dietitian for more information. Summary  Alcohol abuse can cause poor nutrition (malnutrition or malnourishment) and a lack of nutrients (nutrient deficiencies), which can lead to more health problems.  Common nutrient deficiencies include vitamin deficiencies (A, B, C,  and D) and mineral deficiencies (calcium, iron, magnesium, and zinc).  Nutrition is an essential factor in therapy for alcohol abuse.  Your health care provider and dietitian can help you to develop a specific eating plan that includes a balanced diet plus vitamin and mineral supplements. This information is not intended to replace advice given to you by your health care provider. Make sure you discuss any questions you have with your health care provider. Document Revised: 09/09/2018 Document Reviewed: 02/05/2017 Elsevier Patient Education  [place] Your Hypertension Hypertension is commonly called high blood pressure. This is when the force of your blood pressing against the walls of your arteries is too strong. Arteries are blood vessels that carry blood from your heart throughout your body. Hypertension forces the heart to work harder to pump blood, and may cause the arteries to become  narrow or stiff. Having untreated or uncontrolled hypertension can cause heart attack, stroke, kidney disease, and other problems. What are blood pressure readings? A blood pressure reading consists of a higher number over a lower number. Ideally, your blood pressure should be below 120/80. The first ("top") number is called the systolic pressure. It is a measure of the pressure in your arteries as your heart beats. The second ("bottom") number is called the diastolic pressure. It is a measure of the pressure in your arteries as the heart relaxes. What does my blood pressure reading mean? Blood pressure is classified into four stages. Based on your blood pressure reading, your health care provider may use the following stages to determine what type of treatment you need, if any. Systolic pressure and diastolic pressure are measured in a unit called mm Hg. Normal  Systolic pressure: below 716.  Diastolic pressure: below 80. Elevated  Systolic pressure: 967-893.  Diastolic pressure: below 80. Hypertension stage 1  Systolic pressure: 810-175.  Diastolic pressure: 10-25. Hypertension stage 2  Systolic pressure: 852 or above.  Diastolic pressure: 90 or above. What health risks are associated with hypertension? Managing your hypertension is an important responsibility. Uncontrolled hypertension can lead to:  A heart attack.  A stroke.  A weakened blood vessel (aneurysm).  Heart failure.  Kidney damage.  Eye damage.  Metabolic syndrome.  Memory and concentration problems. What changes can I make to manage my hypertension? Hypertension can be managed by making lifestyle changes and possibly by taking medicines. Your health care provider will help you make a plan to bring your blood pressure within a normal range. Eating and drinking   Eat a diet that is high in fiber and potassium, and low in salt (sodium), added sugar, and fat. An example eating plan is called the DASH  (Dietary Approaches to Stop Hypertension) diet. To eat this way: ? Eat plenty of fresh fruits and vegetables. Try to fill half of your plate at each meal with fruits and vegetables. ? Eat whole grains, such as whole wheat pasta, brown rice, or whole grain bread. Fill about one quarter of your plate with whole grains. ? Eat low-fat diary products. ? Avoid fatty cuts of meat, processed or cured meats, and poultry with skin. Fill about one quarter of your plate with lean proteins such as fish, chicken without skin, beans, eggs, and tofu. ? Avoid premade and processed foods. These tend to be higher in sodium, added sugar, and fat.  Reduce your daily sodium intake. Most people with hypertension should eat less than 1,500 mg of sodium a day.  Limit alcohol intake to no  more than 1 drink a day for nonpregnant women and 2 drinks a day for men. One drink equals 12 oz of beer, 5 oz of wine, or 1 oz of hard liquor. Lifestyle  Work with your health care provider to maintain a healthy body weight, or to lose weight. Ask what an ideal weight is for you.  Get at least 30 minutes of exercise that causes your heart to beat faster (aerobic exercise) most days of the week. Activities may include walking, swimming, or biking.  Include exercise to strengthen your muscles (resistance exercise), such as weight lifting, as part of your weekly exercise routine. Try to do these types of exercises for 30 minutes at least 3 days a week.  Do not use any products that contain nicotine or tobacco, such as cigarettes and e-cigarettes. If you need help quitting, ask your health care provider.  Control any long-term (chronic) conditions you have, such as high cholesterol or diabetes. Monitoring  Monitor your blood pressure at home as told by your health care provider. Your personal target blood pressure may vary depending on your medical conditions, your age, and other factors.  Have your blood pressure checked regularly,  as often as told by your health care provider. Working with your health care provider  Review all the medicines you take with your health care provider because there may be side effects or interactions.  Talk with your health care provider about your diet, exercise habits, and other lifestyle factors that may be contributing to hypertension.  Visit your health care provider regularly. Your health care provider can help you create and adjust your plan for managing hypertension. Will I need medicine to control my blood pressure? Your health care provider may prescribe medicine if lifestyle changes are not enough to get your blood pressure under control, and if:  Your systolic blood pressure is 130 or higher.  Your diastolic blood pressure is 80 or higher. Take medicines only as told by your health care provider. Follow the directions carefully. Blood pressure medicines must be taken as prescribed. The medicine does not work as well when you skip doses. Skipping doses also puts you at risk for problems. Contact a health care provider if:  You think you are having a reaction to medicines you have taken.  You have repeated (recurrent) headaches.  You feel dizzy.  You have swelling in your ankles.  You have trouble with your vision. Get help right away if:  You develop a severe headache or confusion.  You have unusual weakness or numbness, or you feel faint.  You have severe pain in your chest or abdomen.  You vomit repeatedly.  You have trouble breathing. Summary  Hypertension is when the force of blood pumping through your arteries is too strong. If this condition is not controlled, it may put you at risk for serious complications.  Your personal target blood pressure may vary depending on your medical conditions, your age, and other factors. For most people, a normal blood pressure is less than 120/80.  Hypertension is managed by lifestyle changes, medicines, or both.  Lifestyle changes include weight loss, eating a healthy, low-sodium diet, exercising more, and limiting alcohol. This information is not intended to replace advice given to you by your health care provider. Make sure you discuss any questions you have with your health care provider. Document Revised: 09/12/2018 Document Reviewed: 04/18/2016 Elsevier Patient Education  [place].  Thrombocytopenia Thrombocytopenia means that you have a low number of platelets  in your blood. Platelets are tiny cells in the blood. When you bleed, they clump together at the cut or injury to stop the bleeding. This is called blood clotting. If you do not have enough platelets, it can cause bleeding problems. Some cases of this condition are mild while others are more severe. What are the causes? This condition may be caused by:  Your body not making enough platelets. This may be caused by: ? Your bone marrow not making blood cells (aplastic anemia). ? Cancer in the bone marrow. ? Certain medicines. ? Infection in the bone marrow. ? Drinking a lot of alcohol.  Your body destroying platelets too quickly. This may be caused by: ? Certain immune diseases. ? Certain medicines. ? Certain blood clotting disorders. ? Certain disorders that are passed from parent to child (inherited). ? Certain bleeding disorders. ? Pregnancy. ? Having a spleen that is larger than normal. What are the signs or symptoms?  Bleeding that is not normal.  Nosebleeds.  Heavy menstrual periods.  Blood in the pee (urine) or poop (stool).  A purple-like color to the skin (purpura).  Bruising.  A rash that looks like pinpoint, purple-red spots (petechiae). How is this treated?  Treatment of another condition that is causing the low platelet count.  Medicines to help protect your platelets from being destroyed.  A replacement (transfusion) of platelets to stop or prevent bleeding.  Surgery to remove the  spleen. Follow these instructions at home: Activity  Avoid activities that could cause you to get hurt or bruised. Follow instructions about how to prevent falls.  Take care not to cut yourself: ? When you shave. ? When you use scissors, needles, knives, or other tools.  Take care not to burn yourself: ? When you use an iron. ? When you cook. General instructions   Check your skin and the inside of your mouth for bruises or blood as told by your doctor.  Check to see if there is blood in your spit (sputum), pee, and poop. Do this as told by your doctor.  Do not drink alcohol.  Take over-the-counter and prescription medicines only as told by your doctor.  Do not take any medicines that have aspirin or NSAIDs in them. These medicines can thin your blood and cause you to bleed.  Tell all of your doctors that you have this condition. Be sure to tell your dentist and eye doctor too. Contact a doctor if:  You have bruises and you do not know why. Get help right away if:  You are bleeding anywhere on your body.  You have blood in your spit, pee, or poop. Summary  Thrombocytopenia means that you have a low number of platelets in your blood.  Platelets are needed for blood clotting.  Symptoms of this condition include bleeding that is not normal, and bruising.  Take care not to cut or burn yourself. This information is not intended to replace advice given to you by your health care provider. Make sure you discuss any questions you have with your health care provider. Document Revised: 02/20/2018 Document Reviewed: 02/20/2018 Elsevier Patient Education  [place].  Allergies, Adult An allergy means that your body reacts to something that bothers it (allergen). It is not a normal reaction. This can happen from something that you:  Eat.  Breathe in.  Touch. You can have an allergy (be allergic) to:  Outdoor things, like: ? Pollen. ? Grass. ? Weeds.  Indoor  things, like: ?  Dust. ? Smoke. ? Pet dander.  Foods.  Medicines.  Things that bother your skin, like: ? Detergents. ? Chemicals. ? Latex.  Perfume.  Bugs. An allergy cannot spread from person to person (is not contagious). Follow these instructions at home:         Stay away from things that you know you are allergic to.  If you have allergies to things in the air, wash out your nose each day. Do it with one of these: ? A salt-water (saline) spray. ? A container (neti pot).  Take over-the-counter and prescription medicines only as told by your doctor.  Keep all follow-up visits as told by your doctor. This is important.  If you are at risk for a very bad allergy reaction (anaphylaxis), keep an auto-injector with you all the time. This is called an epinephrine injection. ? This is pre-measured medicine with a needle. You can put it into your skin by yourself. ? Right after you have a very bad allergy reaction, you or a person with you must give the medicine in less than a few minutes. This is an emergency.  If you have ever had a very bad allergy reaction, wear a medical alert bracelet or necklace. Your very bad allergy should be written on it. Contact a health care provider if:  Your symptoms do not get better with treatment. Get help right away if:  You have symptoms of a very bad allergy reaction. These include: ? A swollen mouth, tongue, or throat. ? Pain or tightness in your chest. ? Trouble breathing. ? Being short of breath. ? Dizziness. ? Fainting. ? Very bad pain in your belly (abdomen). ? Throwing up (vomiting). ? Watery poop (diarrhea). Summary  An allergy means that your body reacts to something that bothers it (allergen). It is not a normal reaction.  Stay away from things that make your body react.  Take over-the-counter and prescription medicines only as told by your doctor.  If you are at risk for a very bad allergy reaction, carry an  auto-injector (epinephrine injection) all the time. Also, wear a medical alert bracelet or necklace so people know about your allergy. This information is not intended to replace advice given to you by your health care provider. Make sure you discuss any questions you have with your health care provider. Document Revised: 09/09/2018 Document Reviewed: 09/03/2016 Elsevier Patient Education  [place].  How to Take Your Blood Pressure You can take your blood pressure at home with a machine. You may need to check your blood pressure at home:  To check if you have high blood pressure (hypertension).  To check your blood pressure over time.  To make sure your blood pressure medicine is working. Supplies needed: You will need a blood pressure machine, or monitor. You can buy one at a drugstore or online. When choosing one:  Choose one with an arm cuff.  Choose one that wraps around your upper arm. Only one finger should fit between your arm and the cuff.  Do not choose one that measures your blood pressure from your wrist or finger. Your doctor can suggest a monitor. How to prepare Avoid these things for 30 minutes before checking your blood pressure:  Drinking caffeine.  Drinking alcohol.  Eating.  Smoking.  Exercising. Five minutes before checking your blood pressure:  Pee.  Sit in a dining chair. Avoid sitting in a soft couch or armchair.  Be quiet. Do not talk. How to take your  blood pressure Follow the instructions that came with your machine. If you have a digital blood pressure monitor, these may be the instructions: 1. Sit up straight. 2. Place your feet on the floor. Do not cross your ankles or legs. 3. Rest your left arm at the level of your heart. You may rest it on a table, desk, or chair. 4. Pull up your shirt sleeve. 5. Wrap the blood pressure cuff around the upper part of your left arm. The cuff should be 1 inch (2.5 cm) above your elbow. It is best to  wrap the cuff around bare skin. 6. Fit the cuff snugly around your arm. You should be able to place only one finger between the cuff and your arm. 7. Put the cord inside the groove of your elbow. 8. Press the power button. 9. Sit quietly while the cuff fills with air and loses air. 10. Write down the numbers on the screen. 11. Wait 2-3 minutes and then repeat steps 1-10. What do the numbers mean? Two numbers make up your blood pressure. The first number is called systolic pressure. The second is called diastolic pressure. An example of a blood pressure reading is "120 over 80" (or 120/80). If you are an adult and do not have a medical condition, use this guide to find out if your blood pressure is normal: Normal  First number: below 120.  Second number: below 80. Elevated  First number: 120-129.  Second number: below 80. Hypertension stage 1  First number: 130-139.  Second number: 80-89. Hypertension stage 2  First number: 140 or above.  Second number: 79 or above. Your blood pressure is above normal even if only the top or bottom number is above normal. Follow these instructions at home:  Check your blood pressure as often as your doctor tells you to.  Take your monitor to your next doctor's appointment. Your doctor will: ? Make sure you are using it correctly. ? Make sure it is working right.  Make sure you understand what your blood pressure numbers should be.  Tell your doctor if your medicines are causing side effects. Contact a doctor if:  Your blood pressure keeps being high. Get help right away if:  Your first blood pressure number is higher than 180.  Your second blood pressure number is higher than 120. This information is not intended to replace advice given to you by your health care provider. Make sure you discuss any questions you have with your health care provider. Document Revised: 05/03/2017 Document Reviewed: 10/28/2015 Elsevier Patient Education   2020 [employer].

## 2019-12-03 ENCOUNTER — Telehealth: Payer: Self-pay

## 2019-12-03 ENCOUNTER — Other Ambulatory Visit: Payer: Self-pay

## 2019-12-03 ENCOUNTER — Other Ambulatory Visit: Payer: Self-pay | Admitting: Family Medicine

## 2019-12-03 DIAGNOSIS — E538 Deficiency of other specified B group vitamins: Secondary | ICD-10-CM

## 2019-12-03 DIAGNOSIS — E876 Hypokalemia: Secondary | ICD-10-CM

## 2019-12-03 DIAGNOSIS — D582 Other hemoglobinopathies: Secondary | ICD-10-CM

## 2019-12-03 DIAGNOSIS — E559 Vitamin D deficiency, unspecified: Secondary | ICD-10-CM

## 2019-12-03 LAB — HEMOGLOBIN A1C: Hgb A1c MFr Bld: 5.6 % (ref 4.6–6.5)

## 2019-12-03 LAB — CBC WITH DIFFERENTIAL/PLATELET
Basophils Absolute: 0.1 10*3/uL (ref 0.0–0.1)
Basophils Relative: 1 % (ref 0.0–3.0)
Eosinophils Absolute: 0.5 10*3/uL (ref 0.0–0.7)
Eosinophils Relative: 9.3 % — ABNORMAL HIGH (ref 0.0–5.0)
HCT: 56.8 % — ABNORMAL HIGH (ref 36.0–46.0)
Hemoglobin: 18.4 g/dL (ref 12.0–15.0)
Lymphocytes Relative: 33.3 % (ref 12.0–46.0)
Lymphs Abs: 1.9 10*3/uL (ref 0.7–4.0)
MCHC: 32.3 g/dL (ref 30.0–36.0)
MCV: 93.1 fl (ref 78.0–100.0)
Monocytes Absolute: 0.4 10*3/uL (ref 0.1–1.0)
Monocytes Relative: 7.2 % (ref 3.0–12.0)
Neutro Abs: 2.8 10*3/uL (ref 1.4–7.7)
Neutrophils Relative %: 49.2 % (ref 43.0–77.0)
Platelets: 151 10*3/uL (ref 150.0–400.0)
RBC: 6.1 Mil/uL — ABNORMAL HIGH (ref 3.87–5.11)
RDW: 16.4 % — ABNORMAL HIGH (ref 11.5–15.5)
WBC: 5.7 10*3/uL (ref 4.0–10.5)

## 2019-12-03 LAB — BRAIN NATRIURETIC PEPTIDE: Pro B Natriuretic peptide (BNP): 59 pg/mL (ref 0.0–100.0)

## 2019-12-03 LAB — FOLATE: Folate: >24.8 ng/mL (ref >5.9)

## 2019-12-03 LAB — COMPREHENSIVE METABOLIC PANEL
ALT: 15 U/L (ref 0–35)
AST: 26 U/L (ref 0–37)
Albumin: 3.8 g/dL (ref 3.5–5.2)
Alkaline Phosphatase: 102 U/L (ref 39–117)
BUN: 7 mg/dL (ref 6–23)
CO2: 28 mEq/L (ref 19–32)
Calcium: 9.3 mg/dL (ref 8.4–10.5)
Chloride: 99 mEq/L (ref 96–112)
Creatinine, Ser: 0.84 mg/dL (ref 0.40–1.20)
GFR: 81.2 mL/min (ref >60.00)
Glucose, Bld: 79 mg/dL (ref 70–99)
Potassium: 3.3 mEq/L — ABNORMAL LOW (ref 3.5–5.1)
Sodium: 139 mEq/L (ref 135–145)
Total Bilirubin: 0.7 mg/dL (ref 0.2–1.2)
Total Protein: 7.8 g/dL (ref 6.0–8.3)

## 2019-12-03 LAB — LIPID PANEL
Cholesterol: 232 mg/dL — ABNORMAL HIGH (ref 0–200)
HDL: 83.3 mg/dL (ref >39.00)
LDL Cholesterol: 121 mg/dL — ABNORMAL HIGH (ref 0–99)
NonHDL: 148.42
Total CHOL/HDL Ratio: 3
Triglycerides: 139 mg/dL (ref 0.0–149.0)
VLDL: 27.8 mg/dL (ref 0.0–40.0)

## 2019-12-03 LAB — VITAMIN D 25 HYDROXY (VIT D DEFICIENCY, FRACTURES): VITD: 19.94 ng/mL — ABNORMAL LOW (ref 30.00–100.00)

## 2019-12-03 LAB — VITAMIN B12: Vitamin B-12: 167 pg/mL — ABNORMAL LOW (ref 211–911)

## 2019-12-03 LAB — LIPASE: Lipase: 4 U/L — ABNORMAL LOW (ref 11.0–59.0)

## 2019-12-03 MED ORDER — POTASSIUM CHLORIDE CRYS ER 20 MEQ PO TBCR: 20.0000 meq | EXTENDED_RELEASE_TABLET | Freq: Every day | ORAL | 0 refills | Status: DC

## 2019-12-03 MED ORDER — VITAMIN D (ERGOCALCIFEROL) 1.25 MG (50000 UNIT) PO CAPS: 50000.0000 [IU] | ORAL_CAPSULE | ORAL | 0 refills | Status: DC

## 2019-12-03 MED ORDER — CYANOCOBALAMIN 1000 MCG/ML IJ SOLN: INTRAMUSCULAR | 3 refills | Status: DC

## 2019-12-03 NOTE — Telephone Encounter (Signed)
Marisue Brooklyn from [facility] lab called  to report  critical lab for pt High Hemoglobin of 18.4

## 2019-12-04 NOTE — Telephone Encounter (Signed)
Pt notified of update. Please see result notes.

## 2019-12-07 ENCOUNTER — Encounter: Payer: Self-pay | Admitting: Family Medicine

## 2019-12-07 DIAGNOSIS — I10 Essential (primary) hypertension: Secondary | ICD-10-CM | POA: Insufficient documentation

## 2019-12-07 DIAGNOSIS — Z8719 Personal history of other diseases of the digestive system: Secondary | ICD-10-CM | POA: Insufficient documentation

## 2019-12-07 NOTE — Progress Notes (Signed)
Patient presents to clinic today to f/u on chronic conditions and establish care.  SUBJECTIVE: PMH: Pt is a 69 yo female with pmh sig for HTN, EtOH use, and h/o asthma.  Pt has not had a regular provider in years.  She was hospitalized 07/13/19 for EtOH induced acute pancreatitis.   EtOH use: -pt notes no longer drinking EtOH. -states may have been drinking a 6 pk/d prior to hospitalization -denies tremor, abd pain, memory deficits, withdrawal sx -endorses watery eyes, abd distention, and LE edema -taking folic acid and a daily MVI.  HTN: -pt taking norvasc 10 mg -notes b/l LE edema -not checking bp at home  Allergies: NKDA  Past Surg Hx: none  Social hx: Pt is single.  She attended [employer].  She is retired.  Pt is a former smoker.  Denies current EtOH use, but was a heavy drinker in the past.  Pt had 3 children, her daughter is desc, but she has 2 living sons.  Family medical hx: Mom- desc., CHF, arthritis, asthma, DM, heart dz, COPD, kidney dz. Father-desc., EtOH abuse, DM, Heart dz. Pt has several siblings who all have- HTN, arhtritis  1 brother has-cirrhosis, HTN, HLD  Healthcare maintenance: -Pfizer Covid vaccine 08/21/2019 and 09/14/2027  Past Medical History:  Diagnosis Date  . Hypertension   . Polycythemia vera(238.4)     Past Surgical History:  Procedure Laterality Date  . BRONCHIAL WASHINGS  07/16/2019   Procedure: BRONCHIAL WASHINGS;  Surgeon: Rigoberto Noel, MD;  Location: [facility];  Service: Cardiopulmonary;;  . VIDEO BRONCHOSCOPY N/A 07/16/2019   Procedure: VIDEO BRONCHOSCOPY WITHOUT FLUORO;  Surgeon: Rigoberto Noel, MD;  Location: [facility];  Service: Cardiopulmonary;  Laterality: N/A;    Current Outpatient Medications on File Prior to Visit  Medication Sig Dispense Refill  . amLODipine (NORVASC) 10 MG tablet Take 1 tablet (10 mg total) by mouth daily. 90 tablet 1  . folic acid (FOLVITE) 1 MG tablet Take 1 tablet (1 mg total) by mouth  daily. 90 tablet 1  . Ipratropium-Albuterol (COMBIVENT) 20-100 MCG/ACT AERS respimat Inhale 1 puff into the lungs in the morning and at bedtime. 4 g 3  . Multiple Vitamin (MULTIVITAMIN WITH MINERALS) TABS tablet Take 1 tablet by mouth daily. 90 tablet 1  . thiamine 100 MG tablet Take 1 tablet (100 mg total) by mouth daily. 90 tablet 1  . umeclidinium bromide (INCRUSE ELLIPTA) 62.5 MCG/INH AEPB Inhale 1 puff into the lungs daily. 30 each 3   No current facility-administered medications on file prior to visit.    No Known Allergies  History reviewed. No pertinent family history.  Social History   Socioeconomic History  . Marital status: Divorced    Spouse name: Not on file  . Number of children: Not on file  . Years of education: Not on file  . Highest education level: Not on file  Occupational History  . Not on file  Tobacco Use  . Smoking status: Former Smoker    Packs/day: 1.00    Types: Cigarettes  . Smokeless tobacco: Never Used  Vaping Use  . Vaping Use: Never used  Substance and Sexual Activity  . Alcohol use: Yes    Alcohol/week: 7.0 standard drinks    Types: 7 Cans of beer per week  . Drug use: No  . Sexual activity: Not on file  Other Topics Concern  . Not on file  Social History Narrative  . Not on file   Social Determinants  of Health   Financial Resource Strain:   . Difficulty of Paying Living Expenses:   Food Insecurity:   . Worried About Charity fundraiser in the Last Year:   . Arboriculturist in the Last Year:   Transportation Needs:   . Film/video editor (Medical):   Marland Kitchen Lack of Transportation (Non-Medical):   Physical Activity:   . Days of Exercise per Week:   . Minutes of Exercise per Session:   Stress:   . Feeling of Stress :   Social Connections:   . Frequency of Communication with Friends and Family:   . Frequency of Social Gatherings with Friends and Family:   . Attends Religious Services:   . Active Member of Clubs or Organizations:    . Attends Archivist Meetings:   Marland Kitchen Marital Status:   Intimate Partner Violence:   . Fear of Current or Ex-Partner:   . Emotionally Abused:   Marland Kitchen Physically Abused:   . Sexually Abused:     ROS General: Denies fever, chills, night sweats, changes in weight, changes in appetite HEENT: Denies headaches, ear pain, changes in vision, rhinorrhea, sore throat CV: Denies CP, palpitations, SOB, orthopnea  +b/l LE edema Pulm: Denies SOB, cough, wheezing GI: Denies abdominal pain, nausea, vomiting, diarrhea, constipation  +abd distention GU: Denies dysuria, hematuria, frequency, vaginal discharge Msk: Denies muscle cramps, joint pains Neuro: Denies weakness, numbness, tingling Skin: Denies rashes, bruising Psych: Denies depression, anxiety, hallucinations  +EtOH abuse  BP (!) 146/84 (BP Location: Left Arm, Patient Position: Sitting, Cuff Size: Normal)   Pulse (!) 106   Temp 98.7 F (37.1 C) (Temporal)   Wt 135 lb (61.2 kg)   SpO2 95%   BMI 24.69 kg/m   Physical Exam Gen. Pleasant, well developed, well-nourished, in NAD HEENT - [place]/AT, PERRL, EOMI, no scleral icterus, no nasal drainage, pharynx without erythema or exudate. Neck: No JVD, no thyromegaly, no carotid bruits Lungs: no use of accessory muscles, CTAB, no wheezes, rales or rhonchi Cardiovascular: RRR, No r/g/m, b/l LE edema 1+ at ankles Abdomen: BS present, soft, nontender. Abdominal distention noted, no fluid wave noted.  hepatormegaly noted. Musculoskeletal: No deformities, moves all four extremities, no cyanosis or clubbing, normal tone Neuro:  A&Ox3, CN II-XII intact, normal gait.  No tremor Skin:  Warm, dry, intact, no lesions.  No spider telangiectasias. Psych: normal affect,  Mood appropriate   Recent Results (from the past 2160 hour(s))  CBC with Differential/Platelet     Status: Abnormal   Collection Time: 12/02/19  3:46 PM  Result Value Ref Range   WBC 5.7 4.0 - 10.5 K/uL   RBC 6.10 (H) 3.87 - 5.11  Mil/uL   Hemoglobin 18.4 Repeated and verified X2. (HH) 12.0 - 15.0 g/dL   HCT 56.8 Repeated and verified X2. (H) 36 - 46 %   MCV 93.1 78.0 - 100.0 fl   MCHC 32.3 30.0 - 36.0 g/dL   RDW 16.4 (H) 11.5 - 15.5 %   Platelets 151.0 150 - 400 K/uL   Neutrophils Relative % 49.2 43 - 77 %   Lymphocytes Relative 33.3 12 - 46 %   Monocytes Relative 7.2 3 - 12 %   Eosinophils Relative 9.3 (H) 0 - 5 %   Basophils Relative 1.0 0 - 3 %   Neutro Abs 2.8 1.4 - 7.7 K/uL   Lymphs Abs 1.9 0.7 - 4.0 K/uL   Monocytes Absolute 0.4 0 - 1 K/uL   Eosinophils Absolute  0.5 0 - 0 K/uL   Basophils Absolute 0.1 0 - 0 K/uL  Comprehensive metabolic panel     Status: Abnormal   Collection Time: 12/02/19  3:46 PM  Result Value Ref Range   Sodium 139 135 - 145 mEq/L   Potassium 3.3 (L) 3.5 - 5.1 mEq/L   Chloride 99 96 - 112 mEq/L   CO2 28 19 - 32 mEq/L   Glucose, Bld 79 70 - 99 mg/dL   BUN 7 6 - 23 mg/dL   Creatinine, Ser 0.84 0.40 - 1.20 mg/dL   Total Bilirubin 0.7 0.2 - 1.2 mg/dL   Alkaline Phosphatase 102 39 - 117 U/L   AST 26 0 - 37 U/L   ALT 15 0 - 35 U/L   Total Protein 7.8 6.0 - 8.3 g/dL   Albumin 3.8 3.5 - 5.2 g/dL   GFR 81.20 >60.00 mL/min   Calcium 9.3 8.4 - 10.5 mg/dL  Brain Natriuretic Peptide     Status: None   Collection Time: 12/02/19  3:46 PM  Result Value Ref Range   Pro B Natriuretic peptide (BNP) 59.0 0.0 - 100.0 pg/mL  Lipase     Status: Abnormal   Collection Time: 12/02/19  3:46 PM  Result Value Ref Range   Lipase 4.0 (L) 11 - 59 U/L  Folate     Status: None   Collection Time: 12/02/19  3:46 PM  Result Value Ref Range   Folate >24.8 >5.9 ng/mL  Vitamin B12     Status: Abnormal   Collection Time: 12/02/19  3:46 PM  Result Value Ref Range   Vitamin B-12 167 (L) 211 - 911 pg/mL  Vitamin D, 25-hydroxy     Status: Abnormal   Collection Time: 12/02/19  3:46 PM  Result Value Ref Range   VITD 19.94 (L) 30.00 - 100.00 ng/mL  Hemoglobin A1c     Status: None   Collection Time: 12/02/19   3:46 PM  Result Value Ref Range   Hgb A1c MFr Bld 5.6 4.6 - 6.5 %    Comment: Glycemic Control Guidelines for People with Diabetes:Non Diabetic:  <6%Goal of Therapy: <7%Additional Action Suggested:  >8%   Lipid panel     Status: Abnormal   Collection Time: 12/02/19  3:46 PM  Result Value Ref Range   Cholesterol 232 (H) 0 - 200 mg/dL    Comment: ATP III Classification       Desirable:  < 200 mg/dL               Borderline High:  200 - 239 mg/dL          High:  > = 240 mg/dL   Triglycerides 139.0 0 - 149 mg/dL    Comment: Normal:  <150 mg/dLBorderline High:  150 - 199 mg/dL   HDL 83.30 >39.00 mg/dL   VLDL 27.8 0.0 - 40.0 mg/dL   LDL Cholesterol 121 (H) 0 - 99 mg/dL   Total CHOL/HDL Ratio 3     Comment:                Men          Women1/2 Average Risk     3.4          3.3Average Risk          5.0          4.42X Average Risk          9.6  7.13X Average Risk          15.0          11.0                       NonHDL 148.42     Comment: NOTE:  Non-HDL goal should be 30 mg/dL higher than patient's LDL goal (i.e. LDL goal of < 70 mg/dL, would have non-HDL goal of < 100 mg/dL)  Ethanol     Status: None   Collection Time: 12/02/19  3:46 PM  Result Value Ref Range   Ethanol <0.010 Cutoff=0.010 %    Comment: This test was developed and its performance characteristics determined by [facility]. It has not been cleared or approved by the Food and Drug Administration.     Assessment/Plan:  History of pancreatitis  -stable -EtOH cessation strongly encouraged - Plan: Comprehensive metabolic panel, Lipase, Hemoglobin A1c  Abdominal distention  -2/2 EtOH use/abuse -given precautions.  Advised to refrain from EtOH use. - Plan: Comprehensive metabolic panel, Lipase  Thrombocytopenia (HCC) -likely 2/2 EtOH use/abuse  - Plan: CBC with Differential/Platelet  ETOH abuse  -cessation strongly encouraged. -pt currently refraining. -Discussed potential health effects of continuing to drink  alcohol. - Plan: CBC with Differential/Platelet, Comprehensive metabolic panel, Lipase, Folate, Vitamin B12, Vitamin D, 25-hydroxy, Lipid panel, Ethanol  Essential hypertension  -Elevated -Continue Norvasc 10 mg -Discussed obtaining BP cuff to monitor BP at home and keep a log to bring to the clinic -Discussed lifestyle modifications -Discussed changing blood pressure medication 2/2 lower extremity edema. - Plan: Comprehensive metabolic panel  Watery eyes  - Plan: fexofenadine (ALLEGRA ALLERGY) 60 MG tablet  Bilateral lower extremity edema  -Discussed lower extremity edema likely multifactorial including hypoalbuminemia, ED medication (Norvasc) -Discussed lifestyle modifications including elevating lower extremities when sitting and decreasing sodium intake. -We will obtain labs -Consider switching from Norvasc to another blood pressure medicine given continued sx - Plan: Comprehensive metabolic panel, Brain Natriuretic Peptide  Encounter to establish care -We reviewed the PMH, PSH, FH, SH, Meds and Allergies. -We provided refills for any medications we will prescribe as needed. -We addressed current concerns per orders and patient instructions. -We have asked for records for pertinent exams, studies, vaccines and notes from previous providers. -We have advised patient to follow up per instructions below.  F/u in 1 month  Update: Patient notified of low B12, vitamin D deficiency, hypokalemia.  Potassium replacement and vitamin D 50,000 IUs sent to pharmacy.  Patient to come into clinic for B12 injections monthly x3 months.  We will recheck CBC next week 2/2 elevated hemoglobin.  Potassium recheck also ordered.   Grier Mitts, MD

## 2019-12-16 ENCOUNTER — Telehealth: Payer: Self-pay | Admitting: Family Medicine

## 2019-12-16 NOTE — Telephone Encounter (Signed)
Dr. Volanda Napoleon sent the patients B12 to the pharmacy but did not send any needles.  Please advise

## 2019-12-18 MED ORDER — "BD ECLIPSE SYRINGE 25G X 5/8"" 3 ML MISC": 0 refills | Status: DC

## 2019-12-18 NOTE — Addendum Note (Signed)
Addended by: Agnes Lawrence on: 12/18/2019 09:13 AM   Modules accepted: Orders

## 2019-12-18 NOTE — Telephone Encounter (Signed)
Rx done. 

## 2019-12-23 ENCOUNTER — Encounter: Payer: Self-pay | Admitting: Family Medicine

## 2019-12-23 ENCOUNTER — Ambulatory Visit (INDEPENDENT_AMBULATORY_CARE_PROVIDER_SITE_OTHER): Payer: Medicare Other | Admitting: Family Medicine

## 2019-12-23 ENCOUNTER — Other Ambulatory Visit: Payer: Self-pay

## 2019-12-23 VITALS — BP 98/60 | HR 108 | Temp 98.2°F | Ht 62.0 in | Wt 143.0 lb

## 2019-12-23 DIAGNOSIS — E876 Hypokalemia: Secondary | ICD-10-CM | POA: Diagnosis not present

## 2019-12-23 DIAGNOSIS — I10 Essential (primary) hypertension: Secondary | ICD-10-CM | POA: Diagnosis not present

## 2019-12-23 DIAGNOSIS — R600 Localized edema: Secondary | ICD-10-CM

## 2019-12-23 DIAGNOSIS — E538 Deficiency of other specified B group vitamins: Secondary | ICD-10-CM | POA: Diagnosis not present

## 2019-12-23 MED ORDER — "BD ECLIPSE SYRINGE 25G X 5/8"" 3 ML MISC": 0 refills | Status: AC

## 2019-12-23 MED ORDER — BLOOD PRESSURE CUFF MISC: 0 refills | Status: AC

## 2019-12-23 MED ORDER — AMLODIPINE BESYLATE 5 MG PO TABS: 5.0000 mg | ORAL_TABLET | Freq: Every day | ORAL | 4 refills | Status: DC

## 2019-12-23 NOTE — Patient Instructions (Addendum)
Health Maintenance Due  Topic Date Due  . Hepatitis C Screening  Never done  . COVID-19 Vaccine (1) Never done  . TETANUS/TDAP  Never done  . MAMMOGRAM  Never done  . COLONOSCOPY  Never done  . DEXA SCAN  Never done  . PNA vac Low Risk Adult (1 of 2 - PCV13) Never done    No flowsheet data found. Managing Your Hypertension Hypertension is commonly called high blood pressure. This is when the force of your blood pressing against the walls of your arteries is too strong. Arteries are blood vessels that carry blood from your heart throughout your body. Hypertension forces the heart to work harder to pump blood, and may cause the arteries to become narrow or stiff. Having untreated or uncontrolled hypertension can cause heart attack, stroke, kidney disease, and other problems. What are blood pressure readings? A blood pressure reading consists of a higher number over a lower number. Ideally, your blood pressure should be below 120/80. The first ("top") number is called the systolic pressure. It is a measure of the pressure in your arteries as your heart beats. The second ("bottom") number is called the diastolic pressure. It is a measure of the pressure in your arteries as the heart relaxes. What does my blood pressure reading mean? Blood pressure is classified into four stages. Based on your blood pressure reading, your health care provider may use the following stages to determine what type of treatment you need, if any. Systolic pressure and diastolic pressure are measured in a unit called mm Hg. Normal  Systolic pressure: below 623.  Diastolic pressure: below 80. Elevated  Systolic pressure: 762-831.  Diastolic pressure: below 80. Hypertension stage 1  Systolic pressure: 517-616.  Diastolic pressure: 07-37. Hypertension stage 2  Systolic pressure: 106 or above.  Diastolic pressure: 90 or above. What health risks are associated with hypertension? Managing your hypertension is an  important responsibility. Uncontrolled hypertension can lead to:  A heart attack.  A stroke.  A weakened blood vessel (aneurysm).  Heart failure.  Kidney damage.  Eye damage.  Metabolic syndrome.  Memory and concentration problems. What changes can I make to manage my hypertension? Hypertension can be managed by making lifestyle changes and possibly by taking medicines. Your health care provider will help you make a plan to bring your blood pressure within a normal range. Eating and drinking   Eat a diet that is high in fiber and potassium, and low in salt (sodium), added sugar, and fat. An example eating plan is called the DASH (Dietary Approaches to Stop Hypertension) diet. To eat this way: ? Eat plenty of fresh fruits and vegetables. Try to fill half of your plate at each meal with fruits and vegetables. ? Eat whole grains, such as whole wheat pasta, brown rice, or whole grain bread. Fill about one quarter of your plate with whole grains. ? Eat low-fat diary products. ? Avoid fatty cuts of meat, processed or cured meats, and poultry with skin. Fill about one quarter of your plate with lean proteins such as fish, chicken without skin, beans, eggs, and tofu. ? Avoid premade and processed foods. These tend to be higher in sodium, added sugar, and fat.  Reduce your daily sodium intake. Most people with hypertension should eat less than 1,500 mg of sodium a day.  Limit alcohol intake to no more than 1 drink a day for nonpregnant women and 2 drinks a day for men. One drink equals 12 oz of beer,  5 oz of wine, or 1 oz of hard liquor. Lifestyle  Work with your health care provider to maintain a healthy body weight, or to lose weight. Ask what an ideal weight is for you.  Get at least 30 minutes of exercise that causes your heart to beat faster (aerobic exercise) most days of the week. Activities may include walking, swimming, or biking.  Include exercise to strengthen your muscles  (resistance exercise), such as weight lifting, as part of your weekly exercise routine. Try to do these types of exercises for 30 minutes at least 3 days a week.  Do not use any products that contain nicotine or tobacco, such as cigarettes and e-cigarettes. If you need help quitting, ask your health care provider.  Control any long-term (chronic) conditions you have, such as high cholesterol or diabetes. Monitoring  Monitor your blood pressure at home as told by your health care provider. Your personal target blood pressure may vary depending on your medical conditions, your age, and other factors.  Have your blood pressure checked regularly, as often as told by your health care provider. Working with your health care provider  Review all the medicines you take with your health care provider because there may be side effects or interactions.  Talk with your health care provider about your diet, exercise habits, and other lifestyle factors that may be contributing to hypertension.  Visit your health care provider regularly. Your health care provider can help you create and adjust your plan for managing hypertension. Will I need medicine to control my blood pressure? Your health care provider may prescribe medicine if lifestyle changes are not enough to get your blood pressure under control, and if:  Your systolic blood pressure is 130 or higher.  Your diastolic blood pressure is 80 or higher. Take medicines only as told by your health care provider. Follow the directions carefully. Blood pressure medicines must be taken as prescribed. The medicine does not work as well when you skip doses. Skipping doses also puts you at risk for problems. Contact a health care provider if:  You think you are having a reaction to medicines you have taken.  You have repeated (recurrent) headaches.  You feel dizzy.  You have swelling in your ankles.  You have trouble with your vision. Get help right  away if:  You develop a severe headache or confusion.  You have unusual weakness or numbness, or you feel faint.  You have severe pain in your chest or abdomen.  You vomit repeatedly.  You have trouble breathing. Summary  Hypertension is when the force of blood pumping through your arteries is too strong. If this condition is not controlled, it may put you at risk for serious complications.  Your personal target blood pressure may vary depending on your medical conditions, your age, and other factors. For most people, a normal blood pressure is less than 120/80.  Hypertension is managed by lifestyle changes, medicines, or both. Lifestyle changes include weight loss, eating a healthy, low-sodium diet, exercising more, and limiting alcohol. This information is not intended to replace advice given to you by your health care provider. Make sure you discuss any questions you have with your health care provider. Document Revised: 09/12/2018 Document Reviewed: 04/18/2016 Elsevier Patient Education  [place].  Hypokalemia Hypokalemia means that the amount of potassium in the blood is lower than normal. Potassium is a chemical (electrolyte) that helps regulate the amount of fluid in the body. It also stimulates muscle  tightening (contraction) and helps nerves work properly. Normally, most of the body's potassium is inside cells, and only a very small amount is in the blood. Because the amount in the blood is so small, minor changes to potassium levels in the blood can be life-threatening. What are the causes? This condition may be caused by:  Antibiotic medicine.  Diarrhea or vomiting. Taking too much of a medicine that helps you have a bowel movement (laxative) can cause diarrhea and lead to hypokalemia.  Chronic kidney disease (CKD).  Medicines that help the body get rid of excess fluid (diuretics).  Eating disorders, such as bulimia.  Low magnesium levels in the  body.  Sweating a lot. What are the signs or symptoms? Symptoms of this condition include:  Weakness.  Constipation.  Fatigue.  Muscle cramps.  Mental confusion.  Skipped heartbeats or irregular heartbeat (palpitations).  Tingling or numbness. How is this diagnosed? This condition is diagnosed with a blood test. How is this treated? This condition may be treated by:  Taking potassium supplements by mouth.  Adjusting the medicines that you take.  Eating more foods that contain a lot of potassium. If your potassium level is very low, you may need to get potassium through an IV and be monitored in the hospital. Follow these instructions at home:   Take over-the-counter and prescription medicines only as told by your health care provider. This includes vitamins and supplements.  Eat a healthy diet. A healthy diet includes fresh fruits and vegetables, whole grains, healthy fats, and lean proteins.  If instructed, eat more foods that contain a lot of potassium. This includes: ? Nuts, such as peanuts and pistachios. ? Seeds, such as sunflower seeds and pumpkin seeds. ? Peas, lentils, and lima beans. ? Whole grain and bran cereals and breads. ? Fresh fruits and vegetables, such as apricots, avocado, bananas, cantaloupe, kiwi, oranges, tomatoes, asparagus, and potatoes. ? Orange juice. ? Tomato juice. ? Red meats. ? Yogurt.  Keep all follow-up visits as told by your health care provider. This is important. Contact a health care provider if you:  Have weakness that gets worse.  Feel your heart pounding or racing.  Vomit.  Have diarrhea.  Have diabetes (diabetes mellitus) and you have trouble keeping your blood sugar (glucose) in your target range. Get help right away if you:  Have chest pain.  Have shortness of breath.  Have vomiting or diarrhea that lasts for more than 2 days.  Faint. Summary  Hypokalemia means that the amount of potassium in the blood is  lower than normal.  This condition is diagnosed with a blood test.  Hypokalemia may be treated by taking potassium supplements, adjusting the medicines that you take, or eating more foods that are high in potassium.  If your potassium level is very low, you may need to get potassium through an IV and be monitored in the hospital. This information is not intended to replace advice given to you by your health care provider. Make sure you discuss any questions you have with your health care provider. Document Revised: 01/01/2018 Document Reviewed: 01/01/2018 Elsevier Patient Education  [place].  Vitamin B12 Deficiency Vitamin B12 deficiency means that your body does not have enough vitamin B12. The body needs this vitamin:  To make red blood cells.  To make genes (DNA).  To help the nerves work. If you do not have enough vitamin B12 in your body, you can have health problems. What are the causes?  Not  eating enough foods that contain vitamin B12.  Not being able to absorb vitamin B12 from the food that you eat.  Certain digestive system diseases.  A condition in which the body does not make enough of a certain protein, which results in too few red blood cells (pernicious anemia).  Having a surgery in which part of the stomach or small intestine is removed.  Taking medicines that make it hard for the body to absorb vitamin B12. These medicines include: ? Heartburn medicines. ? Some antibiotic medicines. ? Other medicines that are used to treat certain conditions. What increases the risk?  Being older than age 30.  Eating a vegetarian or vegan diet, especially while you are pregnant.  Eating a poor diet while you are pregnant.  Taking certain medicines.  Having alcoholism. What are the signs or symptoms? In some cases, there are no symptoms. If the condition leads to too few blood cells or nerve damage, symptoms can occur, such as:  Feeling weak.  Feeling  tired (fatigued).  Not being hungry.  Weight loss.  A loss of feeling (numbness) or tingling in your hands and feet.  Redness and burning of the tongue.  Being mixed up (confused) or having memory problems.  Sadness (depression).  Problems with your senses. This can include color blindness, ringing in the ears, or loss of taste.  Watery poop (diarrhea) or trouble pooping (constipation).  Trouble walking. If anemia is very bad, symptoms can include:  Being short of breath.  Being dizzy.  Having a very fast heartbeat. How is this treated?  Changing the way you eat and drink, such as: ? Eating more foods that contain vitamin B12. ? Drinking little or no alcohol.  Getting vitamin B12 shots.  Taking vitamin B12 supplements. Your doctor will tell you the dose that is best for you. Follow these instructions at home: Eating and drinking   Eat lots of healthy foods that contain vitamin B12. These include: ? Meats and poultry, such as beef, pork, chicken, [place], and organ meats, such as liver. ? Seafood, such as clams, rainbow trout, salmon, tuna, and haddock. ? Eggs. ? Cereal and dairy products that have vitamin B12 added to them. Check the label. The items listed above may not be a complete list of what you can eat and drink. Contact a dietitian for more options. General instructions  Get any shots as told by your doctor.  Take supplements only as told by your doctor.  Do not drink alcohol if your doctor tells you not to. In some cases, you may only be asked to limit alcohol use.  Keep all follow-up visits as told by your doctor. This is important. Contact a doctor if:  Your symptoms come back. Get help right away if:  You have trouble breathing.  You have a very fast heartbeat.  You have chest pain.  You get dizzy.  You pass out. Summary  Vitamin B12 deficiency means that your body is not getting enough vitamin B12.  In some cases, there are no  symptoms of this condition.  Treatment may include making a change in the way you eat and drink, getting vitamin B12 shots, or taking supplements.  Eat lots of healthy foods that contain vitamin B12. This information is not intended to replace advice given to you by your health care provider. Make sure you discuss any questions you have with your health care provider. Document Revised: 01/28/2018 Document Reviewed: 01/28/2018 Elsevier Patient Education  2020 [employer].

## 2019-12-23 NOTE — Progress Notes (Signed)
Subjective:    Patient ID: Natasha Holland, female    DOB: 04-05-1951, 69 y.o.   MRN: [id number]  Chief Complaint  Patient presents with   Follow-up    3 week f/u    HPI Patient was seen today for f/u.   Pt notes she has been feeling better overall.  Pt took potassium supplements after hypokalemia noted on labs, 3.3 on 12/02/19.  Pt does endorse lower extremity edema and pain in ankles.  Denies tenderness in calves.  Feels like swelling started after taking Norvasc 10 mg.   Pt states she is not drinking EtOH.  Pt eating 3 meals per day.  Does not have a home bp cuff.  Pt never received prescription for syringe needles for vitamin B12 replacement.  Pt accidentally took ergocalciferol 50,000 IU daily instead of weekly.  States she is never felt better after taking that medication.  Past Medical History:  Diagnosis Date   Hypertension    Polycythemia vera(238.4)     No Known Allergies  ROS General: Denies fever, chills, night sweats, changes in weight, changes in appetite HEENT: Denies headaches, ear pain, changes in vision, rhinorrhea, sore throat CV: Denies CP, palpitations, SOB, orthopnea  +LE edema Pulm: Denies SOB, cough, wheezing GI: Denies abdominal pain, nausea, vomiting, diarrhea, constipation GU: Denies dysuria, hematuria, frequency, vaginal discharge Msk: Denies muscle cramps, joint pains  Neuro: Denies weakness, numbness, tingling Skin: Denies rashes, bruising Psych: Denies depression, anxiety, hallucinations     Objective:    Blood pressure 98/60, pulse (!) 108, temperature 98.2 F (36.8 C), temperature source Other (Comment), height 5' 2"  (1.575 m), weight 143 lb (64.9 kg), SpO2 98 %.   Gen. Pleasant, well-nourished, in no distress, normal affect   HEENT: [place]/AT, face symmetric, no scleral icterus, PERRLA, EOMI, nares patent without drainage Lungs: no accessory muscle use, CTAB, no wheezes or rales Cardiovascular: RRR, no m/r/g,  1 + pitting edema in b/l LEs at  ankles, L>R.  LLE edema trace and shin.  No calf tenderness Musculoskeletal: No deformities, no cyanosis or clubbing, normal tone Neuro:  A&Ox3, CN II-XII intact, normal gait Skin:  Warm, no lesions/ rash.  Hyperpigmentation of bilateral ankles.   Wt Readings from Last 3 Encounters:  12/23/19 143 lb (64.9 kg)  12/02/19 135 lb (61.2 kg)  07/17/19 143 lb 1.3 oz (64.9 kg)    Lab Results  Component Value Date   WBC 5.7 12/02/2019   HGB 18.4 Repeated and verified X2. (HH) 12/02/2019   HCT 56.8 Repeated and verified X2. (H) 12/02/2019   PLT 151.0 12/02/2019   GLUCOSE 79 12/02/2019   CHOL 232 (H) 12/02/2019   TRIG 139.0 12/02/2019   HDL 83.30 12/02/2019   LDLCALC 121 (H) 12/02/2019   ALT 15 12/02/2019   AST 26 12/02/2019   NA 139 12/02/2019   K 3.3 (L) 12/02/2019   CL 99 12/02/2019   CREATININE 0.84 12/02/2019   BUN 7 12/02/2019   CO2 28 12/02/2019   TSH 0.517 05/02/2010   HGBA1C 5.6 12/02/2019    Assessment/Plan:  Essential hypertension  -controlled -discussed decreasing Norvasc from 10 mg to 5 mg to see if edema improves. -Given Rx for home BP cuff patient encouraged to check blood pressure regularly. -for continued LE edema discussed d/c'ing Norvasc completely.   - Plan: amLODipine (NORVASC) 5 MG tablet, Blood Pressure Monitoring (BLOOD PRESSURE CUFF) MISC, BMP with eGFR([facility]), Magnesium  Bilateral lower extremity edema  -reviewed meds.  Norvasc 10 mg possible cause -will  decrease Norvasc to 5 mg to see if pt notices improvement -continue supportive care including elevation, decreasing sodium intake. -For continued LE edema discussed d/c'ing Norvasc completely. - Plan: BMP with eGFR([facility]), CBC (no diff)  Vitamin B12 deficiency  -continue monthly B12 injections - Plan: Vitamin B12  Hypokalemia  - Plan: BMP with eGFR([facility]), Magnesium, Magnesium, BMP with eGFR([facility])  F/u in 1 month, sooner if neede.  Grier Mitts, MD

## 2019-12-24 LAB — BASIC METABOLIC PANEL WITH GFR
BUN: 10 mg/dL (ref 7–25)
CO2: 27 mmol/L (ref 20–32)
Calcium: 9.3 mg/dL (ref 8.6–10.4)
Chloride: 101 mmol/L (ref 98–110)
Creat: 0.69 mg/dL (ref 0.50–0.99)
GFR, Est African American: 103 mL/min/{1.73_m2} (ref >=60)
GFR, Est Non African American: 89 mL/min/{1.73_m2} (ref >=60)
Glucose, Bld: 86 mg/dL (ref 65–99)
Potassium: 3.5 mmol/L (ref 3.5–5.3)
Sodium: 137 mmol/L (ref 135–146)

## 2019-12-24 LAB — CBC
HCT: 55.4 % — ABNORMAL HIGH (ref 35.0–45.0)
Hemoglobin: 17.9 g/dL — ABNORMAL HIGH (ref 11.7–15.5)
MCH: 29.4 pg (ref 27.0–33.0)
MCHC: 32.3 g/dL (ref 32.0–36.0)
MCV: 91 fL (ref 80.0–100.0)
MPV: 9.8 fL (ref 7.5–12.5)
Platelets: 252 10*3/uL (ref 140–400)
RBC: 6.09 10*6/uL — ABNORMAL HIGH (ref 3.80–5.10)
RDW: 13.6 % (ref 11.0–15.0)
WBC: 5.9 10*3/uL (ref 3.8–10.8)

## 2019-12-24 LAB — MAGNESIUM: Magnesium: 1.8 mg/dL (ref 1.5–2.5)

## 2019-12-24 LAB — VITAMIN B12: Vitamin B-12: 410 pg/mL (ref 200–1100)

## 2020-03-11 ENCOUNTER — Other Ambulatory Visit: Payer: Self-pay | Admitting: Family Medicine

## 2020-03-11 DIAGNOSIS — E559 Vitamin D deficiency, unspecified: Secondary | ICD-10-CM

## 2020-05-20 ENCOUNTER — Other Ambulatory Visit: Payer: Self-pay | Admitting: Family Medicine

## 2020-05-20 DIAGNOSIS — I10 Essential (primary) hypertension: Secondary | ICD-10-CM

## 2020-10-31 ENCOUNTER — Other Ambulatory Visit: Payer: Self-pay | Admitting: Family Medicine

## 2020-10-31 DIAGNOSIS — I10 Essential (primary) hypertension: Secondary | ICD-10-CM

## 2020-11-02 ENCOUNTER — Other Ambulatory Visit: Payer: Self-pay | Admitting: Family Medicine

## 2020-11-02 DIAGNOSIS — I10 Essential (primary) hypertension: Secondary | ICD-10-CM

## 2021-04-09 ENCOUNTER — Other Ambulatory Visit: Payer: Self-pay | Admitting: Family Medicine

## 2021-04-09 DIAGNOSIS — I10 Essential (primary) hypertension: Secondary | ICD-10-CM

## 2021-04-10 ENCOUNTER — Other Ambulatory Visit: Payer: Self-pay | Admitting: Family Medicine

## 2021-04-10 DIAGNOSIS — I10 Essential (primary) hypertension: Secondary | ICD-10-CM

## 2021-05-14 ENCOUNTER — Other Ambulatory Visit: Payer: Self-pay | Admitting: Family Medicine

## 2021-05-14 DIAGNOSIS — I10 Essential (primary) hypertension: Secondary | ICD-10-CM

## 2021-05-15 ENCOUNTER — Other Ambulatory Visit: Payer: Self-pay | Admitting: Family Medicine

## 2021-05-15 DIAGNOSIS — I10 Essential (primary) hypertension: Secondary | ICD-10-CM

## 2021-05-16 NOTE — Telephone Encounter (Signed)
Spoke with Niece re: pt needed appt; Last OV 05/15/20. Appt scheduled. Niece is requesting the PCP bring up alcohol drinking & pt nutrition status as she Bryn Gulling) does not want to mention these concerns herself as needing discussion with provider.

## 2021-05-26 ENCOUNTER — Encounter: Payer: Medicare Other | Admitting: Family Medicine

## 2021-06-15 ENCOUNTER — Ambulatory Visit (INDEPENDENT_AMBULATORY_CARE_PROVIDER_SITE_OTHER): Payer: Medicare Other | Admitting: Family Medicine

## 2021-06-15 ENCOUNTER — Encounter: Payer: Self-pay | Admitting: Family Medicine

## 2021-06-15 VITALS — BP 104/68 | HR 114 | Temp 98.8°F | Wt 129.4 lb

## 2021-06-15 DIAGNOSIS — E876 Hypokalemia: Secondary | ICD-10-CM

## 2021-06-15 DIAGNOSIS — R1310 Dysphagia, unspecified: Secondary | ICD-10-CM

## 2021-06-15 DIAGNOSIS — H6123 Impacted cerumen, bilateral: Secondary | ICD-10-CM | POA: Diagnosis not present

## 2021-06-15 DIAGNOSIS — Z780 Asymptomatic menopausal state: Secondary | ICD-10-CM

## 2021-06-15 DIAGNOSIS — E538 Deficiency of other specified B group vitamins: Secondary | ICD-10-CM

## 2021-06-15 DIAGNOSIS — Z8719 Personal history of other diseases of the digestive system: Secondary | ICD-10-CM

## 2021-06-15 DIAGNOSIS — Z789 Other specified health status: Secondary | ICD-10-CM | POA: Diagnosis not present

## 2021-06-15 DIAGNOSIS — R600 Localized edema: Secondary | ICD-10-CM

## 2021-06-15 DIAGNOSIS — K089 Disorder of teeth and supporting structures, unspecified: Secondary | ICD-10-CM

## 2021-06-15 DIAGNOSIS — R5383 Other fatigue: Secondary | ICD-10-CM | POA: Diagnosis not present

## 2021-06-15 DIAGNOSIS — R1013 Epigastric pain: Secondary | ICD-10-CM | POA: Diagnosis not present

## 2021-06-15 LAB — COMPREHENSIVE METABOLIC PANEL
ALT: 33 U/L (ref 0–35)
AST: 78 U/L — ABNORMAL HIGH (ref 0–37)
Albumin: 2.6 g/dL — ABNORMAL LOW (ref 3.5–5.2)
Alkaline Phosphatase: 138 U/L — ABNORMAL HIGH (ref 39–117)
BUN: 4 mg/dL — ABNORMAL LOW (ref 6–23)
CO2: 34 mEq/L — ABNORMAL HIGH (ref 19–32)
Calcium: 7.6 mg/dL — ABNORMAL LOW (ref 8.4–10.5)
Chloride: 91 mEq/L — ABNORMAL LOW (ref 96–112)
Creatinine, Ser: 0.85 mg/dL (ref 0.40–1.20)
GFR: 69.06 mL/min (ref >60.00)
Glucose, Bld: 112 mg/dL — ABNORMAL HIGH (ref 70–99)
Potassium: 3 mEq/L — ABNORMAL LOW (ref 3.5–5.1)
Sodium: 136 mEq/L (ref 135–145)
Total Bilirubin: 1.9 mg/dL — ABNORMAL HIGH (ref 0.2–1.2)
Total Protein: 6.6 g/dL (ref 6.0–8.3)

## 2021-06-15 LAB — VITAMIN B12: Vitamin B-12: >1550 pg/mL — ABNORMAL HIGH (ref 211–911)

## 2021-06-15 LAB — CBC WITH DIFFERENTIAL/PLATELET
Basophils Absolute: 0.1 10*3/uL (ref 0.0–0.1)
Basophils Relative: 0.8 % (ref 0.0–3.0)
Eosinophils Absolute: 0 10*3/uL (ref 0.0–0.7)
Eosinophils Relative: 0.7 % (ref 0.0–5.0)
HCT: 41.2 % (ref 36.0–46.0)
Hemoglobin: 13.5 g/dL (ref 12.0–15.0)
Lymphocytes Relative: 24.7 % (ref 12.0–46.0)
Lymphs Abs: 1.7 10*3/uL (ref 0.7–4.0)
MCHC: 32.7 g/dL (ref 30.0–36.0)
MCV: 101.1 fl — ABNORMAL HIGH (ref 78.0–100.0)
Monocytes Absolute: 0.8 10*3/uL (ref 0.1–1.0)
Monocytes Relative: 12.5 % — ABNORMAL HIGH (ref 3.0–12.0)
Neutro Abs: 4.1 10*3/uL (ref 1.4–7.7)
Neutrophils Relative %: 61.3 % (ref 43.0–77.0)
Platelets: 178 10*3/uL (ref 150.0–400.0)
RBC: 4.08 Mil/uL (ref 3.87–5.11)
RDW: 16.3 % — ABNORMAL HIGH (ref 11.5–15.5)
WBC: 6.7 10*3/uL (ref 4.0–10.5)

## 2021-06-15 LAB — T4, FREE: Free T4: 1.02 ng/dL (ref 0.60–1.60)

## 2021-06-15 LAB — LIPID PANEL
Cholesterol: 146 mg/dL (ref 0–200)
HDL: 26.6 mg/dL — ABNORMAL LOW (ref >39.00)
LDL Cholesterol: 93 mg/dL (ref 0–99)
NonHDL: 119.02
Total CHOL/HDL Ratio: 5
Triglycerides: 129 mg/dL (ref 0.0–149.0)
VLDL: 25.8 mg/dL (ref 0.0–40.0)

## 2021-06-15 LAB — GAMMA GT: GGT: 297 U/L — ABNORMAL HIGH (ref 7–51)

## 2021-06-15 LAB — VITAMIN D 25 HYDROXY (VIT D DEFICIENCY, FRACTURES): VITD: 60.83 ng/mL (ref 30.00–100.00)

## 2021-06-15 LAB — LIPASE: Lipase: 4 U/L — ABNORMAL LOW (ref 11.0–59.0)

## 2021-06-15 LAB — BRAIN NATRIURETIC PEPTIDE: Pro B Natriuretic peptide (BNP): 74 pg/mL (ref 0.0–100.0)

## 2021-06-15 LAB — FOLATE: Folate: 3.3 ng/mL — ABNORMAL LOW (ref >5.9)

## 2021-06-15 LAB — TSH: TSH: 1.52 u[IU]/mL (ref 0.35–5.50)

## 2021-06-15 NOTE — Progress Notes (Signed)
Subjective:    Patient ID: Natasha Holland, female    DOB: 06-Sep-1950, 71 y.o.   MRN: [id number]  Chief Complaint  Patient presents with   Fatigue    Feeling tired and not been able to eat in about 3 weeks.   Pt accompanied by her son.  HPI Patient is a 71 year old female with pmh sig HTN, polycythemia vera, alcohol use, history of pancreatitis lost to f/u, last seen 12/23/19, seen today for ongoing concern.  Pt endorses dysphagia for solids x3 weeks and fatigue.  Currently drinking fluids, soups/broth.  Drinking 2 cans alcoholic beverages per day.  Endorses occasional bilateral lower extremity edema, worse likely the day, resolving in the morning.  Denies constipation, jaundice, pruritus, abd distention, n/v, RUQ pain.  Endorses an episode of BRBPR 3 months ago.states saw blood on toilet paper.  Denies dark-colored stools.  Patient inquires about a dentist in the area.   Patient Active Problem List   Diagnosis Date Noted   Essential hypertension 12/07/2019   History of pancreatitis 12/07/2019   Acute pancreatitis 07/14/2019   Elevated blood pressure reading 07/14/2019   Alcohol use 07/14/2019   Thrombocytopenia (HCC) 07/14/2019   Common bile duct dilatation 07/14/2019    No Known Allergies  ROS General: Denies fever, chills, night sweats, changes in weight, changes in appetite + fatigue HEENT: Denies headaches, ear pain, changes in vision, rhinorrhea, sore throat  CV: Denies CP, palpitations, SOB, orthopnea Pulm: Denies SOB, cough, wheezing GI: Denies abdominal pain, nausea, vomiting, diarrhea, constipation + dysphagia for solids GU: Denies dysuria, hematuria, frequency, vaginal discharge Msk: Denies muscle cramps, joint pains Neuro: Denies weakness, numbness, tingling Skin: Denies rashes, bruising Psych: Denies depression, anxiety, hallucinations     Objective:    Blood pressure 104/68, pulse (!) 114, temperature 98.8 F (37.1 C), temperature source Oral, weight 129 lb 6.4 oz  (58.7 kg), SpO2 92 %.  Gen. Pleasant, well-nourished, in no distress, normal affect   HEENT: [place]/AT, face symmetric, conjunctiva clear, no scleral icterus, PERRLA, EOMI, nares patent without drainage, poor dentition, several caries and lower teeth upper dentures.  Pharynx without erythema or exudate. Neck: No JVD, no thyromegaly, no carotid bruits Lungs: no accessory muscle use, CTAB, no wheezes or rales Cardiovascular: Tachycardia, no m/r/g, no peripheral edema Abdomen: BS present, soft, NT/ND, mildly enlarged liver on percussion Musculoskeletal: No deformities, no cyanosis or clubbing, normal tone Neuro:  A&Ox3, CN II-XII intact, normal gait.  No asterixis. Skin:  Warm, no lesions/ rash   Wt Readings from Last 3 Encounters:  06/15/21 129 lb 6.4 oz (58.7 kg)  12/23/19 143 lb (64.9 kg)  12/02/19 135 lb (61.2 kg)    Lab Results  Component Value Date   WBC 5.9 12/23/2019   HGB 17.9 (H) 12/23/2019   HCT 55.4 (H) 12/23/2019   PLT 252 12/23/2019   GLUCOSE 86 12/23/2019   CHOL 232 (H) 12/02/2019   TRIG 139.0 12/02/2019   HDL 83.30 12/02/2019   LDLCALC 121 (H) 12/02/2019   ALT 15 12/02/2019   AST 26 12/02/2019   NA 137 12/23/2019   K 3.5 12/23/2019   CL 101 12/23/2019   CREATININE 0.69 12/23/2019   BUN 10 12/23/2019   CO2 27 12/23/2019   TSH 0.517 05/02/2010   HGBA1C 5.6 12/02/2019    Assessment/Plan:  Other fatigue  -Discussed possible causes including decreased p.o. intake and deconditioning, electrolyte abnormalities, thyroid dysfunction - Plan: TSH, T4, Free, Vitamin D, 25-hydroxy, Vitamin B12  Dysphagia, unspecified type  -Likely  multifactorial including poor p.o. intake 2/2 alcohol use and poor dentition.  Consider esophageal stricture -Modifications when eating - Plan: Ambulatory referral to Gastroenterology  Poor dentition -Given a list of area dental providers -Encouraged to check for in network providers with insurance company  Alcohol use  -Advised to  continue cutting down EtOH intake with plans for complete cessation - Plan: CMP, Lipase, Gamma GT, Folate, Lipid panel, Ambulatory referral to Gastroenterology  Epigastric abdominal pain -Likely 2/2 EtOH use.  Also consider acute pancreatitis given history, or gastritis - Plan: CMP, CBC with Differential/Platelet, Lipase, Gamma GT, Ambulatory referral to Gastroenterology  Bilateral impacted cerumen -Consent obtained.  Bilateral ears irrigated.  Patient tolerated procedure well. -OTC Debrox eardrops for continued impaction  Bilateral lower extremity edema -Likely dependent edema versus protein deficiency 2/2 alcohol use and decreased p.o. intake -Supportive care including elevating LEs when resting, TED hose or compression socks, decreasing sodium intake.  - Plan: CMP, Brain Natriuretic Peptide  History of pancreatitis  Post-menopausal  - Plan: DG Bone Density  F/u in 3-4 wks More than 50% of over 40 minutes spent in total in caring for this patient was spent face-to-face, reviewing the chart, counseling and/or coordinating care.   Update: Alk phos elevated at 138, hypokalemia noted with potassium 3.0, hypocalcemia with calcium 7.6, GGT elevated at 297, phobic acid low at 3.3.  Prescriptions for supplements sent to pharmacy.  Grier Mitts, MD

## 2021-06-16 MED ORDER — POTASSIUM CHLORIDE CRYS ER 20 MEQ PO TBCR: 20.0000 meq | EXTENDED_RELEASE_TABLET | Freq: Two times a day (BID) | ORAL | 0 refills | Status: DC

## 2021-06-16 MED ORDER — FOLIC ACID 1 MG PO TABS: 1.0000 mg | ORAL_TABLET | Freq: Every day | ORAL | 1 refills | Status: AC

## 2021-06-17 ENCOUNTER — Other Ambulatory Visit: Payer: Self-pay | Admitting: Family Medicine

## 2021-06-17 ENCOUNTER — Other Ambulatory Visit: Payer: Self-pay

## 2021-06-17 ENCOUNTER — Emergency Department (HOSPITAL_COMMUNITY)
Admission: EM | Admit: 2021-06-17 | Discharge: 2021-06-18 | Disposition: A | Discharge status: Home / Self Care | Payer: Medicare Other | Attending: Emergency Medicine | Admitting: Emergency Medicine

## 2021-06-17 ENCOUNTER — Encounter (HOSPITAL_COMMUNITY): Payer: Self-pay | Admitting: *Deleted

## 2021-06-17 DIAGNOSIS — Z79899 Other long term (current) drug therapy: Secondary | ICD-10-CM | POA: Insufficient documentation

## 2021-06-17 DIAGNOSIS — E876 Hypokalemia: Secondary | ICD-10-CM

## 2021-06-17 DIAGNOSIS — I10 Essential (primary) hypertension: Secondary | ICD-10-CM | POA: Diagnosis not present

## 2021-06-17 DIAGNOSIS — R1084 Generalized abdominal pain: Secondary | ICD-10-CM | POA: Insufficient documentation

## 2021-06-17 DIAGNOSIS — R000 Tachycardia, unspecified: Secondary | ICD-10-CM | POA: Diagnosis not present

## 2021-06-17 LAB — URINALYSIS, MICROSCOPIC (REFLEX)

## 2021-06-17 LAB — COMPREHENSIVE METABOLIC PANEL
ALT: 32 U/L (ref 0–44)
AST: 82 U/L — ABNORMAL HIGH (ref 15–41)
Albumin: 2.2 g/dL — ABNORMAL LOW (ref 3.5–5.0)
Alkaline Phosphatase: 137 U/L — ABNORMAL HIGH (ref 38–126)
Anion gap: 14 (ref 5–15)
BUN: <5 mg/dL — ABNORMAL LOW (ref 8–23)
CO2: 25 mmol/L (ref 22–32)
Calcium: 8 mg/dL — ABNORMAL LOW (ref 8.9–10.3)
Chloride: 94 mmol/L — ABNORMAL LOW (ref 98–111)
Creatinine, Ser: 0.74 mg/dL (ref 0.44–1.00)
GFR, Estimated: >60 mL/min (ref >60)
Glucose, Bld: 98 mg/dL (ref 70–99)
Potassium: 3.5 mmol/L (ref 3.5–5.1)
Sodium: 133 mmol/L — ABNORMAL LOW (ref 135–145)
Total Bilirubin: 1.6 mg/dL — ABNORMAL HIGH (ref 0.3–1.2)
Total Protein: 6.6 g/dL (ref 6.5–8.1)

## 2021-06-17 LAB — CBC
HCT: 37.8 % (ref 36.0–46.0)
Hemoglobin: 13.8 g/dL (ref 12.0–15.0)
MCH: 34.3 pg — ABNORMAL HIGH (ref 26.0–34.0)
MCHC: 36.5 g/dL — ABNORMAL HIGH (ref 30.0–36.0)
MCV: 94 fL (ref 80.0–100.0)
Platelets: 159 10*3/uL (ref 150–400)
RBC: 4.02 MIL/uL (ref 3.87–5.11)
RDW: 17.1 % — ABNORMAL HIGH (ref 11.5–15.5)
WBC: 7.6 10*3/uL (ref 4.0–10.5)
nRBC: 0.3 % — ABNORMAL HIGH (ref 0.0–0.2)

## 2021-06-17 LAB — URINALYSIS, ROUTINE W REFLEX MICROSCOPIC
Glucose, UA: 100 mg/dL — AB
Hgb urine dipstick: NEGATIVE
Ketones, ur: NEGATIVE mg/dL
Nitrite: NEGATIVE
Protein, ur: NEGATIVE mg/dL
Specific Gravity, Urine: 1.015 (ref 1.005–1.030)
pH: 8.5 — ABNORMAL HIGH (ref 5.0–8.0)

## 2021-06-17 LAB — LIPASE, BLOOD: Lipase: 19 U/L (ref 11–51)

## 2021-06-17 NOTE — ED Triage Notes (Signed)
The pt has not had an appetite for 2 weeks  all day she has had  abd pain and some urinary frequency

## 2021-06-18 MED ORDER — OMEPRAZOLE 20 MG PO CPDR: 20.0000 mg | DELAYED_RELEASE_CAPSULE | Freq: Every day | ORAL | 1 refills | Status: DC

## 2021-06-18 NOTE — ED Notes (Signed)
Provide with lunch bag per MD request

## 2021-06-18 NOTE — ED Provider Notes (Signed)
[facility] EMERGENCY DEPARTMENT Provider Note   CSN: [id number] Arrival date & time: 06/17/21  2016     History  Chief Complaint  Patient presents with   Abdominal Pain    Natasha Holland is a 71 y.o. female.  Patient is a 71 year old female with a history of hypertension, polycythemia, alcohol abuse, prior pancreatitis who is presenting today with complaints of abdominal pain.  Patient unfortunately has waited for 11 hours prior to being seen but reports to me that she started having abdominal pain yesterday around 1:00 after taking a potassium pill which she had never taken before.  She did take it with a little bit of applesauce but otherwise reported that her stomach was empty when she took the medication and she thinks that is what has made it sore.  She has had no nausea or vomiting.  She denies any diarrhea.  No fever, cough.  She denies urinary symptoms to me however upon arrival to the emergency room she reported to the triage nurse that she had not had an appetite for 2 weeks and she had had abdominal pain and urinary frequency the day she arrived.  Patient is still drinking approximately 3 alcoholic drinks per day.  She is taking her amlodipine regularly.  She was seen by her doctor recently and found to have a potassium of 3.0 based on external medical records.  That is why the folic acid and potassium were started.  She denies any chest pain or shortness of breath at this time.  No prior abdominal surgeries.  She reports now her abdomen is just mildly sore but not in any specific location.  The history is provided by the patient.  Abdominal Pain     Home Medications Prior to Admission medications   Medication Sig Start Date End Date Taking? Authorizing Provider  amLODipine (NORVASC) 5 MG tablet TAKE 1 TABLET(5 MG) BY MOUTH DAILY 05/15/21   Billie Ruddy, MD  Blood Pressure Monitoring (BLOOD PRESSURE CUFF) MISC Use daily as directed. 12/23/19   Billie Ruddy, MD  cyanocobalamin (,VITAMIN B-12,) 1000 MCG/ML injection Inject to the skin every month for the next 3 months Patient not taking: Reported on 06/15/2021 12/03/19   Billie Ruddy, MD  fexofenadine [facility] ALLERGY) 60 MG tablet Take 1 tablet (60 mg total) by mouth daily. Patient taking differently: Take 60 mg by mouth as needed. 12/02/19   Billie Ruddy, MD  folic acid (FOLVITE) 1 MG tablet Take 1 tablet (1 mg total) by mouth daily. 06/16/21   Billie Ruddy, MD  Ipratropium-Albuterol (COMBIVENT) 20-100 MCG/ACT AERS respimat Inhale 1 puff into the lungs in the morning and at bedtime. 07/18/19   Carollee Leitz, MD  Multiple Vitamin (MULTIVITAMIN WITH MINERALS) TABS tablet Take 1 tablet by mouth daily. Patient not taking: Reported on 06/15/2021 07/18/19   Carollee Leitz, MD  potassium chloride SA (KLOR-CON M) 20 MEQ tablet Take 1 tablet (20 mEq total) by mouth 2 (two) times daily for 3 days. 06/16/21 06/19/21  Billie Ruddy, MD  SYRINGE-NEEDLE, DISP, 3 ML (BD ECLIPSE SYRINGE) 25G X 5/8" 3 ML MISC Use as directed 12/23/19   Billie Ruddy, MD  thiamine 100 MG tablet Take 1 tablet (100 mg total) by mouth daily. Patient not taking: Reported on 06/15/2021 07/18/19   Carollee Leitz, MD  umeclidinium bromide (INCRUSE ELLIPTA) 62.5 MCG/INH AEPB Inhale 1 puff into the lungs daily. 07/19/19   Carollee Leitz, MD  Vitamin D,  Ergocalciferol, (DRISDOL) 1.25 MG (50000 UNIT) CAPS capsule Take 1 capsule (50,000 Units total) by mouth every 7 (seven) days. Patient not taking: Reported on 06/15/2021 12/03/19   Billie Ruddy, MD      Allergies    Patient has no known allergies.    Review of Systems   Review of Systems  Gastrointestinal:  Positive for abdominal pain.   Physical Exam Updated Vital Signs BP 125/85    Pulse (!) 110    Temp 99.2 F (37.3 C)    Resp 17    Ht 5\' 2"  (1.575 m)    Wt 58.5 kg    SpO2 98%    BMI 23.59 kg/m  Physical Exam Vitals and nursing note reviewed.  Constitutional:       General: She is not in acute distress.    Appearance: She is well-developed.     Comments: Patient appears very comfortable  HENT:     Head: Normocephalic and atraumatic.     Mouth/Throat:     Mouth: Mucous membranes are moist.  Eyes:     Pupils: Pupils are equal, round, and reactive to light.  Cardiovascular:     Rate and Rhythm: Regular rhythm. Tachycardia present.     Pulses: Normal pulses.     Heart sounds: Normal heart sounds. No murmur heard.   No friction rub.  Pulmonary:     Effort: Pulmonary effort is normal.     Breath sounds: Normal breath sounds. No wheezing or rales.  Abdominal:     General: Bowel sounds are normal. There is no distension.     Palpations: Abdomen is soft.     Tenderness: There is abdominal tenderness. There is no guarding or rebound.     Comments: Mild diffuse abdominal tenderness in all quadrants without any rebound or guarding.  Musculoskeletal:        General: No tenderness. Normal range of motion.     Cervical back: Normal range of motion and neck supple.     Right lower leg: Edema present.     Left lower leg: Edema present.     Comments: 1+ pitting edema in the ankles bilaterally  Skin:    General: Skin is warm and dry.     Findings: No rash.  Neurological:     Mental Status: She is alert and oriented to person, place, and time. Mental status is at baseline.     Cranial Nerves: No cranial nerve deficit.  Psychiatric:        Mood and Affect: Mood normal.        Behavior: Behavior normal.    ED Results / Procedures / Treatments   Labs (all labs ordered are listed, but only abnormal results are displayed) Labs Reviewed  COMPREHENSIVE METABOLIC PANEL - Abnormal; Notable for the following components:      Result Value   Sodium 133 (*)    Chloride 94 (*)    BUN <5 (*)    Calcium 8.0 (*)    Albumin 2.2 (*)    AST 82 (*)    Alkaline Phosphatase 137 (*)    Total Bilirubin 1.6 (*)    All other components within normal limits  CBC -  Abnormal; Notable for the following components:   MCH 34.3 (*)    MCHC 36.5 (*)    RDW 17.1 (*)    nRBC 0.3 (*)    All other components within normal limits  URINALYSIS, ROUTINE W REFLEX MICROSCOPIC - Abnormal; Notable for the  following components:   pH 8.5 (*)    Glucose, UA 100 (*)    Bilirubin Urine MODERATE (*)    Leukocytes,Ua TRACE (*)    All other components within normal limits  URINALYSIS, MICROSCOPIC (REFLEX) - Abnormal; Notable for the following components:   Bacteria, UA RARE (*)    All other components within normal limits  LIPASE, BLOOD    EKG None  Radiology No results found.  Procedures Procedures    Medications Ordered in ED Medications - No data to display  ED Course/ Medical Decision Making/ A&P                           Medical Decision Making Amount and/or Complexity of Data Reviewed External Data Reviewed: labs and notes. Labs: ordered. Decision-making details documented in ED Course.  Risk Prescription drug management.   Patient is a 71 year old female presenting today with complaint of abdominal pain.  She reports the pain started after taking potassium which was given to her by her PCP and she started it yesterday.  Throughout her weight in the emergency room she reports the pain is gradually improving.  She denies any nausea or vomiting.  On exam she does have mild diffuse tenderness but no focal tenderness.  I independently evaluated and interpreted patient's labs today and she has a normal lipase, CMP with normal potassium today, normal renal function, mild hypocalcemia of 8.0, minimally elevated AST of 82 and total bilirubin of 1.6 which is around baseline for patient based on prior records, CBC with no acute findings.  Based on patient's recent office visit last week she did have a decreased folic acid level as well and she had been started on folic acid.  Based on their visit in the office she was having trouble swallowing and was getting  ambulatory referral to GI.  She did note to have swelling in the office visit but feel most likely due to protein deficiency, malnutrition from alcohol use.  Patient here is not displaying findings consistent with pancreatitis, appendicitis, diverticulitis or perforated viscus.  Patient's potassium is normal today and she does not seem to tolerate the potassium pills and discussed with her increasing the amount of potassium in her diet including orange juice, bananas, leafy greens.  She does not endorse a difficulty swallowing here.  She was able to eat a sandwich and applesauce without difficulty.  On reevaluation after eating she does not have worsening abdominal pain.  Do not feel that she needs emergent CT at this time.  However do feel that it is important for her to follow-up with GI as an outpatient.  Did discuss with her possible PPI treatment for the next 2 weeks to see if that helps with her abdominal discomfort.  8:56 AM Pt did eat without difficulty and repeat abdominal exam was benign.  Will start ppi and encouraged f/u.  Pt given return precautions.        Final Clinical Impression(s) / ED Diagnoses Final diagnoses:  Generalized abdominal pain    Rx / DC Orders ED Discharge Orders          Ordered    omeprazole (PRILOSEC) 20 MG capsule  Daily        06/18/21 0845              Blanchie Dessert, MD 06/18/21 [id number]

## 2021-06-18 NOTE — ED Notes (Signed)
Tolerated food at this time

## 2021-06-18 NOTE — Discharge Instructions (Addendum)
You can stop the potassium but make sure you are eating foods with potassium. Start taking the stomach medicine and be sure to follow up with the gasto doctor.  Return to the ER if you develop severe abdominal pain you start vomiting, have fever or other concerns.

## 2021-06-21 ENCOUNTER — Other Ambulatory Visit: Payer: Self-pay | Admitting: Family Medicine

## 2021-06-21 DIAGNOSIS — I10 Essential (primary) hypertension: Secondary | ICD-10-CM

## 2021-06-26 ENCOUNTER — Encounter: Payer: Self-pay | Admitting: Gastroenterology

## 2021-06-26 ENCOUNTER — Other Ambulatory Visit (INDEPENDENT_AMBULATORY_CARE_PROVIDER_SITE_OTHER): Payer: Medicare Other

## 2021-06-26 ENCOUNTER — Ambulatory Visit (INDEPENDENT_AMBULATORY_CARE_PROVIDER_SITE_OTHER): Payer: Medicare Other | Admitting: Gastroenterology

## 2021-06-26 VITALS — BP 100/80 | HR 99 | Ht 62.0 in | Wt 119.0 lb

## 2021-06-26 DIAGNOSIS — R1310 Dysphagia, unspecified: Secondary | ICD-10-CM

## 2021-06-26 DIAGNOSIS — R000 Tachycardia, unspecified: Secondary | ICD-10-CM | POA: Diagnosis not present

## 2021-06-26 DIAGNOSIS — R1319 Other dysphagia: Secondary | ICD-10-CM | POA: Diagnosis not present

## 2021-06-26 DIAGNOSIS — K709 Alcoholic liver disease, unspecified: Secondary | ICD-10-CM

## 2021-06-26 DIAGNOSIS — R634 Abnormal weight loss: Secondary | ICD-10-CM

## 2021-06-26 LAB — PROTIME-INR
INR: 1.2 ratio — ABNORMAL HIGH (ref 0.8–1.0)
Prothrombin Time: 13.1 s (ref 9.6–13.1)

## 2021-06-26 LAB — HEPATIC FUNCTION PANEL
ALT: 22 U/L (ref 0–35)
AST: 55 U/L — ABNORMAL HIGH (ref 0–37)
Albumin: 2.8 g/dL — ABNORMAL LOW (ref 3.5–5.2)
Alkaline Phosphatase: 135 U/L — ABNORMAL HIGH (ref 39–117)
Bilirubin, Direct: 0.7 mg/dL — ABNORMAL HIGH (ref 0.0–0.3)
Total Bilirubin: 1.9 mg/dL — ABNORMAL HIGH (ref 0.2–1.2)
Total Protein: 7.4 g/dL (ref 6.0–8.3)

## 2021-06-26 MED ORDER — OMEPRAZOLE 20 MG PO CPDR: 20.0000 mg | DELAYED_RELEASE_CAPSULE | Freq: Every day | ORAL | 3 refills | Status: AC

## 2021-06-26 NOTE — Patient Instructions (Signed)
If you are age 72 or older, your body mass index should be between 23-30. Your Body mass index is 21.77 kg/m. If this is out of the aforementioned range listed, please consider follow up with your Primary Care Provider.  If you are age 28 or younger, your body mass index should be between 19-25. Your Body mass index is 21.77 kg/m. If this is out of the aformentioned range listed, please consider follow up with your Primary Care Provider.   We have sent the following medications to your pharmacy for you to pick up at your convenience: Pantoprazole 20 mg once daily.  Your provider has requested that you go to the basement level for lab work before leaving today. Press "B" on the elevator. The lab is located at the first door on the left as you exit the elevator.  You have been scheduled for a CT scan of the abdomen and pelvis at [facility], 1st floor Radiology. You are scheduled on 07/04/21  at 2:30pm. You should arrive 15 minutes prior to your appointment time for registration.  Please pick up 2 bottles of contrast from [place] at least 3 days prior to your scan. The solution may taste better if refrigerated, but do NOT add ice or any other liquid to this solution. Shake well before drinking.   Please follow the written instructions below on the day of your exam:   1) Do not eat anything after 10am (4 hours prior to your test)   2) Drink 1 bottle of contrast @ 12:30pm (2 hours prior to your exam)  Remember to shake well before drinking and do NOT pour over ice.     Drink 1 bottle of contrast @ 1:30pm (1 hour prior to your exam)   You may take any medications as prescribed with a small amount of water, if necessary. If you take any of the following medications: METFORMIN, GLUCOPHAGE, GLUCOVANCE, AVANDAMET, RIOMET, FORTAMET, [place] MET, JANUMET, GLUMETZA or METAGLIP, you MAY be asked to HOLD this medication 48 hours AFTER the exam.   The purpose of you drinking the oral contrast is  to aid in the visualization of your intestinal tract. The contrast solution may cause some diarrhea. Depending on your individual set of symptoms, you may also receive an intravenous injection of x-ray contrast/dye. Plan on being at [facility] for 45 minutes or longer, depending on the type of exam you are having performed.   If you have any questions regarding your exam or if you need to reschedule, you may call [facility] at [phone] between the hours of 8:00 am and 5:00 pm, Monday-Friday.   You have been scheduled for a Barium Esophogram at [facility] (1st floor of the hospital) on 07/04/19 at 9:30am. Please arrive 15 minutes prior to your appointment for registration. Make certain not to have anything to eat or drink 3 hours prior to your test. If you need to reschedule for any reason, please contact radiology at [phone] to do so. __________________________________________________________________ A barium swallow is an examination that concentrates on views of the esophagus. This tends to be a double contrast exam (barium and two liquids which, when combined, create a gas to distend the wall of the oesophagus) or single contrast (non-ionic iodine based). The study is usually tailored to your symptoms so a good history is essential. Attention is paid during the study to the form, structure and configuration of the esophagus, looking for functional disorders (such as aspiration, dysphagia, achalasia,  motility and reflux) EXAMINATION You may be asked to change into a gown, depending on the type of swallow being performed. A radiologist and radiographer will perform the procedure. The radiologist will advise you of the type of contrast selected for your procedure and direct you during the exam. You will be asked to stand, sit or lie in several different positions and to hold a small amount of fluid in your mouth before being asked to swallow while the imaging is performed .In  some instances you may be asked to swallow barium coated marshmallows to assess the motility of a solid food bolus. The exam can be recorded as a digital or video fluoroscopy procedure. POST PROCEDURE It will take 1-2 days for the barium to pass through your system. To facilitate this, it is important, unless otherwise directed, to increase your fluids for the next 24-48hrs and to resume your normal diet.  This test typically takes about 30 minutes to perform.  The [place] [facility] providers would like to encourage you to use [facility] to communicate with providers for non-urgent requests or questions.  Due to long hold times on the telephone, sending your provider a message by [facility] may be a faster and more efficient way to get a response.  Please allow 48 business hours for a response.  Please remember that this is for non-urgent requests.   It was a pleasure to see you today!  Thank you for trusting me with your gastrointestinal care!    Scott E.Cunningham,MD

## 2021-06-26 NOTE — Progress Notes (Signed)
HPI : Natasha Holland is a pleasant 71 year old female with a history of alcohol abuse and alcoholic pancreatitis and infrequent medical encounters who is referred to [place] by Dr. Grier Mitts for symptoms of dysphagia.  The patient is accompanied by her son today.  The patient is very minimal with her responses to questions about her symptoms, and the son provided much of the history about what she is been experiencing at home.  When asked, the patient denies have any trouble swallowing.  However, her son indicates that she has been complaining of swallowing for at least 3 weeks, and that she has been cutting her food up very small pieces and eating very carefully.  She also went to the Emergency Department January 14th with abdominal pain.  When asked if she was having abdominal pain the patient says no.  She states that she was having pain, but now this is resolved.  There is not appear to be any history of nausea or vomiting.  No instances of food getting stuck and having to be forcefully vomited or regurgitated.  Bowel movements have been normal, without blood or melena.  No problems with constipation or diarrhea.  She used to have problems with heartburn several years ago but has not been a problem in recent years.  She states her appetite is normal.  Her son reports weight loss of at least 10 pounds in the past month.  Review of the records in the EMR indicate that she weighed 143 pounds in July 2021.  January 12 of this year, she weighed 129 pounds.  Today she weighs 119 pounds. The patient has a longstanding history of heavy alcohol use.  She had been drinking 2-4 drinks a day for the past several years.  Prior to that, she was drinking 5-6 drinks per day.  She was hospitalized with alcohol induced pancreatitis in 2021 and advised to stop drinking at that time.  An MRCP during that hospitalization showed a fatty appearing liver but no evidence of cirrhosis.  Her labs in the emergency department last week were  notable for an AST of 82, total bilirubin of 1.6 and alkaline phosphatase 136.  She had low serum albumin at 2.2.  Patient states that she has not drank since that emergency department visit.  She does admit to noticing yellowing of the eyes in the past 3 months as well as dark urine.  She denies any swelling of the abdomen.  She has had intermittent swelling of the legs off and on for the past 3 years.  It has not been worsened recently.  She denies any cognitive problems such as difficulty thinking or concentrating, or slowness of thought or speech.  The son corroborates this. Patient reports chronic significant shortness of breath.  Patient's son states that she can go about 10-15 steps before she gets winded.  She could not go up a flight of stairs without stopping.  She denies chest pressure or chest pain, just dyspnea.  She quit smoking about 10 years ago.  During her hospitalization for pancreatitis in 2021, she was noted to have a CT with patchy airspace consolidation with air bronchograms and a subsequent chest CT showed complete collapse of the right lower lobe.  A bronchoscopy was done during the hospitalization and no lesion was noted.  She was treated for suspected aspiration pneumonia.  She was also noted to have persistent tachycardia and was recommended to have an outpatient echocardiogram.  It does not appear this was ever  performed.   Past Medical History:  Diagnosis Date   Hypertension    Polycythemia vera(238.4)      Past Surgical History:  Procedure Laterality Date   BRONCHIAL WASHINGS  07/16/2019   Procedure: BRONCHIAL WASHINGS;  Surgeon: Rigoberto Noel, MD;  Location: [facility] ENDOSCOPY;  Service: Cardiopulmonary;;   VIDEO BRONCHOSCOPY N/A 07/16/2019   Procedure: VIDEO BRONCHOSCOPY WITHOUT FLUORO;  Surgeon: Rigoberto Noel, MD;  Location: [place];  Service: Cardiopulmonary;  Laterality: N/A;   Family History  Problem Relation Age of Onset   Colon cancer Neg Hx    Pancreatic  cancer Neg Hx    Stomach cancer Neg Hx    Liver cancer Neg Hx    Esophageal cancer Neg Hx    Social History   Tobacco Use   Smoking status: Former    Packs/day: 1.00    Types: Cigarettes   Smokeless tobacco: Never  Vaping Use   Vaping Use: Never used  Substance Use Topics   Alcohol use: Yes    Alcohol/week: 7.0 standard drinks    Types: 7 Cans of beer per week   Drug use: No   Current Outpatient Medications  Medication Sig Dispense Refill   Blood Pressure Monitoring (BLOOD PRESSURE CUFF) MISC Use daily as directed. 1 each 0   fexofenadine (ALLEGRA ALLERGY) 60 MG tablet Take 1 tablet (60 mg total) by mouth daily. (Patient taking differently: Take 60 mg by mouth as needed.) 30 tablet 1   folic acid (FOLVITE) 1 MG tablet Take 1 tablet (1 mg total) by mouth daily. 90 tablet 1   Ipratropium-Albuterol (COMBIVENT) 20-100 MCG/ACT AERS respimat Inhale 1 puff into the lungs in the morning and at bedtime. 4 g 3   umeclidinium bromide (INCRUSE ELLIPTA) 62.5 MCG/INH AEPB Inhale 1 puff into the lungs daily. 30 each 3   Vitamin D, Ergocalciferol, (DRISDOL) 1.25 MG (50000 UNIT) CAPS capsule Take 1 capsule (50,000 Units total) by mouth every 7 (seven) days. 12 capsule 0   amLODipine (NORVASC) 5 MG tablet TAKE 1 TABLET(5 MG) BY MOUTH DAILY 90 tablet 0   SYRINGE-NEEDLE, DISP, 3 ML (BD ECLIPSE SYRINGE) 25G X 5/8" 3 ML MISC Use as directed 50 each 0   No current facility-administered medications for this visit.   No Known Allergies   Review of Systems: All systems reviewed and negative except where noted in HPI.    No results found.  Physical Exam: BP 100/80    Pulse 99    Ht 5\' 2"  (1.575 m)    Wt 119 lb (54 kg)    SpO2 92%    BMI 21.77 kg/m  Constitutional: Pleasant,well-developed, frail elderly African American female in no acute distress.  Uses walker for ambulation.  Accompanied by son HEENT: Normocephalic and atraumatic. Conjunctivae are normal.  Hyperpigmentation of the sclera, without  obvious icterus.  Poor dentition, mostly edentulous Neck supple.  Cardiovascular: Tachycardic, regular rhythm.  No murmurs Pulmonary/chest: Effort normal and breath sounds normal. No wheezing, rales or rhonchi. Abdominal: Soft, nondistended, nontender. Bowel sounds active throughout. There are no masses palpable.  Liver palpable 2 fingers breaths below the right costal margin, mildly tender to palpation Extremities: Trace b/l lower extremity edema Neurological: Alert and oriented to person place and time.  No asterixis Skin: Skin is warm and dry. No rashes noted. Psychiatric: Normal mood and affect. Behavior is normal.  As mentioned in HPI, patient is fairly brief with her responses, and contradicts some of the information provided by her son  as well as what was in the electronic medical record regarding her symptoms.  CBC    Component Value Date/Time   WBC 7.6 06/17/2021 2111   RBC 4.02 06/17/2021 2111   HGB 13.8 06/17/2021 2111   HCT 37.8 06/17/2021 2111   PLT 159 06/17/2021 2111   MCV 94.0 06/17/2021 2111   MCH 34.3 (H) 06/17/2021 2111   MCHC 36.5 (H) 06/17/2021 2111   RDW 17.1 (H) 06/17/2021 2111   LYMPHSABS 1.7 06/15/2021 1409   MONOABS 0.8 06/15/2021 1409   EOSABS 0.0 06/15/2021 1409   BASOSABS 0.1 06/15/2021 1409    CMP     Component Value Date/Time   NA 133 (L) 06/17/2021 2111   K 3.5 06/17/2021 2111   CL 94 (L) 06/17/2021 2111   CO2 25 06/17/2021 2111   GLUCOSE 98 06/17/2021 2111   BUN <5 (L) 06/17/2021 2111   CREATININE 0.74 06/17/2021 2111   CREATININE 0.69 12/23/2019 1441   CALCIUM 8.0 (L) 06/17/2021 2111   PROT 6.6 06/17/2021 2111   ALBUMIN 2.2 (L) 06/17/2021 2111   AST 82 (H) 06/17/2021 2111   ALT 32 06/17/2021 2111   ALKPHOS 137 (H) 06/17/2021 2111   BILITOT 1.6 (H) 06/17/2021 2111   GFRNONAA >60 06/17/2021 2111   GFRNONAA 89 12/23/2019 1441   GFRAA 103 12/23/2019 1441     ASSESSMENT AND PLAN: 71 year old female with history of heavy alcohol abuse  and history of alcoholic pancreatitis, with vague 3-week history of dysphagia, abdominal pain and unintentional weight loss.  An upper endoscopy is definitely indicated, given the patient's poor functional status and lack of medical encounters, I think she may have undiagnosed cardiopulmonary disease, and is high risk for an outpatient procedure in the endoscopy center.  Her procedure will be best performed in the hospital, but there are no hospital slots currently available with me for at least the next 2 months.  We can get a barium swallow fairly quickly, and this may be able to tell us if there are any concerning abnormalities such as a mass or severe stricture/stenosis that we need to be worried about.  If so, we may need to expedite a hospital endoscopy.  If the barium swallow is normal, this is reassuring and we can proceed with a routine outpatient hospital EGD.  I would like to get a CT scan of the abdomen and pelvis given the patient's abdominal pain and unintentional weight loss to exclude mass lesion of the pancreas.  I think she would benefit from a nutrition referral to help her optimize her calorie intake and curb some of the weight loss.  She has very poor dentition, and will be limited in types of food she can eat.  Based on her most recent hepatic panel, I am also suspicious for cirrhosis.  CT scan will also provide some evidence of a cirrhotic appearing liver or portal hypertension. Other her liver disease almost certainly secondary to chronic alcohol use, she has not been evaluated to exclude other causes of liver disease.  We will definitively rule out autoimmune, viral and genetic/metabolic causes of chronic liver disease.  The patient is aware of the problems that alcohol has caused in terms of her pancreatitis, and liver disease.  I suspect a lot of her gait instability is also alcohol related Regarding her dyspnea and tachycardia, I would like to get the echocardiogram that been  recommended 2 years ago.   Dysphagia -Barium swallow  -EGD in hospital, timing of which will be  based on barium swallow results  Weight loss -CT abd/pelvis -Nutrition referral  Alcoholic liver disease -Exclude other causes of chronic liver disease - CT to assess for e/o cirrhosis/PHT - Patient reports abstinence, strongly advised her to continue this.   Tachycardia/dyspnea - TTE  The details, risks (including bleeding, perforation, infection, missed lesions, medication reactions and possible hospitalization or surgery if complications occur), benefits, and alternatives to EGD with possible biopsy and possible dilation were discussed with the patient and he/she consents to proceed.   Olinda Nola E. Candis Schatz, MD [facility]   CC:  Billie Ruddy, MD

## 2021-06-26 NOTE — Progress Notes (Deleted)
HPI :    Past Medical History:  Diagnosis Date   Hypertension    Polycythemia vera(238.4)      Past Surgical History:  Procedure Laterality Date   BRONCHIAL WASHINGS  07/16/2019   Procedure: BRONCHIAL WASHINGS;  Surgeon: Rigoberto Noel, MD;  Location: [place];  Service: Cardiopulmonary;;   VIDEO BRONCHOSCOPY N/A 07/16/2019   Procedure: VIDEO BRONCHOSCOPY WITHOUT FLUORO;  Surgeon: Rigoberto Noel, MD;  Location: [place];  Service: Cardiopulmonary;  Laterality: N/A;   Family History  Problem Relation Age of Onset   Colon cancer Neg Hx    Pancreatic cancer Neg Hx    Stomach cancer Neg Hx    Liver cancer Neg Hx    Esophageal cancer Neg Hx    Social History   Tobacco Use   Smoking status: Former    Packs/day: 1.00    Types: Cigarettes   Smokeless tobacco: Never  Vaping Use   Vaping Use: Never used  Substance Use Topics   Alcohol use: Yes    Alcohol/week: 7.0 standard drinks    Types: 7 Cans of beer per week   Drug use: No   Current Outpatient Medications  Medication Sig Dispense Refill   amLODipine (NORVASC) 5 MG tablet TAKE 1 TABLET(5 MG) BY MOUTH DAILY 90 tablet 0   Blood Pressure Monitoring (BLOOD PRESSURE CUFF) MISC Use daily as directed. 1 each 0   fexofenadine (ALLEGRA ALLERGY) 60 MG tablet Take 1 tablet (60 mg total) by mouth daily. (Patient taking differently: Take 60 mg by mouth as needed.) 30 tablet 1   folic acid (FOLVITE) 1 MG tablet Take 1 tablet (1 mg total) by mouth daily. 90 tablet 1   Ipratropium-Albuterol (COMBIVENT) 20-100 MCG/ACT AERS respimat Inhale 1 puff into the lungs in the morning and at bedtime. 4 g 3   Multiple Vitamin (MULTIVITAMIN WITH MINERALS) TABS tablet Take 1 tablet by mouth daily. (Patient not taking: Reported on 06/15/2021) 90 tablet 1   potassium chloride SA (KLOR-CON M) 20 MEQ tablet Take 1 tablet (20 mEq total) by mouth 2 (two) times daily for 3 days. 6 tablet 0   SYRINGE-NEEDLE, DISP, 3 ML (BD ECLIPSE SYRINGE) 25G X 5/8" 3  ML MISC Use as directed 50 each 0   thiamine 100 MG tablet Take 1 tablet (100 mg total) by mouth daily. (Patient not taking: Reported on 06/15/2021) 90 tablet 1   umeclidinium bromide (INCRUSE ELLIPTA) 62.5 MCG/INH AEPB Inhale 1 puff into the lungs daily. 30 each 3   Vitamin D, Ergocalciferol, (DRISDOL) 1.25 MG (50000 UNIT) CAPS capsule Take 1 capsule (50,000 Units total) by mouth every 7 (seven) days. (Patient not taking: Reported on 06/15/2021) 12 capsule 0   No current facility-administered medications for this visit.   No Known Allergies   Review of Systems: All systems reviewed and negative except where noted in HPI.    No results found.  Physical Exam: Ht 5\' 2"  (1.575 m)    Wt 119 lb (54 kg)    BMI 21.77 kg/m  Constitutional: Pleasant,well-developed, ***female in no acute distress. HEENT: Normocephalic and atraumatic. Conjunctivae are normal. No scleral icterus. Neck supple.  Cardiovascular: Normal rate, regular rhythm.  Pulmonary/chest: Effort normal and breath sounds normal. No wheezing, rales or rhonchi. Abdominal: Soft, nondistended, nontender. Bowel sounds active throughout. There are no masses palpable. No hepatomegaly. Extremities: no edema Lymphadenopathy: No cervical adenopathy noted. Neurological: Alert and oriented to person place and time. Skin: Skin is warm and dry. No rashes  noted. Psychiatric: Normal mood and affect. Behavior is normal.  CBC    Component Value Date/Time   WBC 7.6 06/17/2021 2111   RBC 4.02 06/17/2021 2111   HGB 13.8 06/17/2021 2111   HCT 37.8 06/17/2021 2111   PLT 159 06/17/2021 2111   MCV 94.0 06/17/2021 2111   MCH 34.3 (H) 06/17/2021 2111   MCHC 36.5 (H) 06/17/2021 2111   RDW 17.1 (H) 06/17/2021 2111   LYMPHSABS 1.7 06/15/2021 1409   MONOABS 0.8 06/15/2021 1409   EOSABS 0.0 06/15/2021 1409   BASOSABS 0.1 06/15/2021 1409    CMP     Component Value Date/Time   NA 133 (L) 06/17/2021 2111   K 3.5 06/17/2021 2111   CL 94 (L)  06/17/2021 2111   CO2 25 06/17/2021 2111   GLUCOSE 98 06/17/2021 2111   BUN <5 (L) 06/17/2021 2111   CREATININE 0.74 06/17/2021 2111   CREATININE 0.69 12/23/2019 1441   CALCIUM 8.0 (L) 06/17/2021 2111   PROT 6.6 06/17/2021 2111   ALBUMIN 2.2 (L) 06/17/2021 2111   AST 82 (H) 06/17/2021 2111   ALT 32 06/17/2021 2111   ALKPHOS 137 (H) 06/17/2021 2111   BILITOT 1.6 (H) 06/17/2021 2111   GFRNONAA >60 06/17/2021 2111   GFRNONAA 89 12/23/2019 1441   GFRAA 103 12/23/2019 1441     ASSESSMENT AND PLAN:  Natasha Ruddy, MD +

## 2021-06-28 ENCOUNTER — Encounter: Payer: Self-pay | Admitting: Family Medicine

## 2021-06-28 ENCOUNTER — Ambulatory Visit (INDEPENDENT_AMBULATORY_CARE_PROVIDER_SITE_OTHER): Payer: Medicare Other | Admitting: Family Medicine

## 2021-06-28 VITALS — BP 144/96 | HR 100 | Temp 98.5°F | Resp 20 | Ht 62.0 in | Wt 122.0 lb

## 2021-06-28 DIAGNOSIS — N3000 Acute cystitis without hematuria: Secondary | ICD-10-CM

## 2021-06-28 DIAGNOSIS — K047 Periapical abscess without sinus: Secondary | ICD-10-CM | POA: Diagnosis not present

## 2021-06-28 DIAGNOSIS — R413 Other amnesia: Secondary | ICD-10-CM | POA: Diagnosis not present

## 2021-06-28 DIAGNOSIS — K709 Alcoholic liver disease, unspecified: Secondary | ICD-10-CM

## 2021-06-28 DIAGNOSIS — I10 Essential (primary) hypertension: Secondary | ICD-10-CM | POA: Diagnosis not present

## 2021-06-28 DIAGNOSIS — R2689 Other abnormalities of gait and mobility: Secondary | ICD-10-CM | POA: Diagnosis not present

## 2021-06-28 DIAGNOSIS — K029 Dental caries, unspecified: Secondary | ICD-10-CM | POA: Diagnosis not present

## 2021-06-28 DIAGNOSIS — W19XXXA Unspecified fall, initial encounter: Secondary | ICD-10-CM

## 2021-06-28 DIAGNOSIS — N3001 Acute cystitis with hematuria: Secondary | ICD-10-CM | POA: Diagnosis not present

## 2021-06-28 LAB — POCT URINALYSIS DIPSTICK
Bilirubin, UA: POSITIVE
Blood, UA: NEGATIVE
Glucose, UA: NEGATIVE
Ketones, UA: 5
Nitrite, UA: NEGATIVE
Protein, UA: POSITIVE — AB
Spec Grav, UA: >=1.03 — AB (ref 1.010–1.025)
Urobilinogen, UA: 2 E.U./dL — AB
pH, UA: 6 (ref 5.0–8.0)

## 2021-06-28 MED ORDER — AMOXICILLIN 500 MG PO TABS: 500.0000 mg | ORAL_TABLET | Freq: Two times a day (BID) | ORAL | 0 refills | Status: DC

## 2021-06-28 NOTE — Progress Notes (Signed)
Subjective:    Patient ID: Natasha Holland, female    DOB: 24-Feb-1951, 71 y.o.   MRN: [id number]  Chief Complaint  Patient presents with   Extremity Weakness    Worsening , feels like she is going to fall  Pt accompanied by her son.  HPI Patient was seen today for f/u on ongoing concerns.  Per pt's son pt having balance issues, now requiring a walker.  Pt fell in bathroom a few days after last OfV on 06/15/21.  Pt denies hitting her head, states felt dizzy but grabbed on to the sink to keep from falling.  Pt initially told her family that she hit her head during the fall.  Patient's son has noticed that she is more forgetful, repeats things, eating/drinking less, and is weak requiring assistance to get up from a sitting position.  Patient endorses intermittent dizziness at times.  Pt son also concerned about dental infection.  Pt was advised that she had infection in several teeth due to cavities.  Extraction and dentures were recommended.  Patient had recent follow-up with GI.  CT abdomen pelvis recommended to assess for possible cirrhosis and EGD in hospital.  TTE recommended for tachycardia/dyspnea as noted during hospitalization.  Patient states she has not had [place] drink in over a week and a half.  Past Medical History:  Diagnosis Date   Hypertension    Polycythemia vera(238.4)     No Known Allergies  ROS General: Denies fever, chills, night sweats, changes in weight, changes in appetite +changes in memory HEENT: Denies headaches, ear pain, changes in vision, rhinorrhea, sore throat CV: Denies CP, palpitations, SOB, orthopnea Pulm: Denies SOB, cough, wheezing GI: Denies abdominal pain, nausea, vomiting, diarrhea, constipation + decreased appetite GU: Denies dysuria, hematuria, frequency, vaginal discharge Msk: Denies muscle cramps, joint pains Neuro: Denies numbness, tingling + weakness/off-balance Skin: Denies rashes, bruising Psych: Denies depression, anxiety, hallucinations      Objective:    Blood pressure (!) 144/96, pulse 100, temperature 98.5 F (36.9 C), resp. rate 20, height 5' 2"  (1.575 m), weight 122 lb (55.3 kg), SpO2 100 %.  Gen. Pleasant, well-nourished, mild confusion, in no distress, normal affect, increased somnolence HEENT: [place]/AT, face symmetric, conjunctiva clear, no scleral icterus, PERRLA, EOMI, left horizontal nystagmus, nares patent without drainage, pharynx without erythema or exudate. Lungs: no accessory muscle use, CTAB slightly decreased breath sounds on the right posteriorly, no wheezes or rales Cardiovascular: RRR, no m/r/g, no peripheral edema Abdomen: BS present, soft, NT/ND. Musculoskeletal: No deformities, no cyanosis or clubbing, normal tone Neuro:  A&Ox2 (person and place), CN II-XII intact, patient appears weak requiring assistance from sitting to standing position relying on walker for stability with ambulation. Skin:  Warm, no lesions/ rash Psych: Alert, ANO x2 (person and place), mild confusion, responses unrelated to initial question asked.  MoCA score 6/30.   Wt Readings from Last 3 Encounters:  06/28/21 122 lb (55.3 kg)  06/26/21 119 lb (54 kg)  06/18/21 129 lb (58.5 kg)    Lab Results  Component Value Date   WBC 7.6 06/17/2021   HGB 13.8 06/17/2021   HCT 37.8 06/17/2021   PLT 159 06/17/2021   GLUCOSE 98 06/17/2021   CHOL 146 06/15/2021   TRIG 129.0 06/15/2021   HDL 26.60 (L) 06/15/2021   LDLCALC 93 06/15/2021   ALT 22 06/26/2021   AST 55 (H) 06/26/2021   NA 133 (L) 06/17/2021   K 3.5 06/17/2021   CL 94 (L) 06/17/2021   CREATININE  0.74 06/17/2021   BUN <5 (L) 06/17/2021   CO2 25 06/17/2021   TSH 1.52 06/15/2021   INR 1.2 (H) 06/26/2021   HGBA1C 5.6 12/02/2019    Assessment/Plan:  Essential hypertension  -BP elevated this visit -Typically low normal.  Was 104/68 on 06/15/2021 -Discussed the importance of lifestyle modifications including Norvasc 5 mg -Continue current medications - Plan: CT HEAD WO  CONTRAST (5MM)  Balance problem  -Possibly related to current UTI. -Also consider structural changes in brain 2/2 history of alcohol abuse, folate and/or vitamin B12 deficiency , CVA or other hemorrhage 2/2 recent fall -Recent labs reviewed from 06/26/2021 and visit on 06/15/2021 -Continue folate -For continued symptoms consider PT - Plan: CT HEAD WO CONTRAST (5MM), POCT urinalysis dipstick  Memory change -Discussed likely multifactorial including history of alcohol abuse, possible infection, vitamin deficiency -Will obtain UA to rule out UTI -Recent labs from 06/26/2021 reviewed -POC UA positive for UTI.  Will start ABX -Continue folate and avoid alcohol -MoCA testing score 6/20 this visit.  Patient completed 12th grade.  Score likely decreased 2/2 subsequent UA with concerns for UTI.  Would repeat testing after completion of antibiotic course. - Plan: CT HEAD WO CONTRAST (5MM), POCT urinalysis dipstick  Fall from standing, initial encounter -Discussed fall prevention -Continue ambulating with walker as needed - Plan: CT HEAD WO CONTRAST (5MM)  Dental caries -Continue follow-up with dentist  Acute cystitis without hematuria  - Plan: Urine Culture, amoxicillin (AMOXIL) 500 MG tablet  Dental infection -Continue follow-up with dentist - Plan: amoxicillin (AMOXIL) 500 MG tablet  Alcoholic liver disease -Alk phos 135 and AST 55 on 06/26/2021 -Continue alcohol cessation strongly encouraged -CT abdomen pelvis planned for 07/03/2021 -Continue follow-up with GI  F/u as needed in the next 2-4 weeks  More than 50% of over 45 minutes spent in total in caring for this patient was spent face-to-face, reviewing the chart, counseling and/or coordinating care.   Grier Mitts, MD

## 2021-06-29 LAB — URINE CULTURE
MICRO NUMBER:: 12918108
SPECIMEN QUALITY:: ADEQUATE

## 2021-06-29 LAB — IGG: IgG (Immunoglobin G), Serum: 2407 mg/dL — ABNORMAL HIGH (ref 600–1540)

## 2021-06-29 LAB — HEPATITIS B SURFACE ANTIGEN: Hepatitis B Surface Ag: NONREACTIVE

## 2021-06-29 LAB — ANTI-SMOOTH MUSCLE ANTIBODY, IGG: Actin (Smooth Muscle) Antibody (IGG): 26 U — ABNORMAL HIGH (ref <20)

## 2021-06-29 LAB — HEPATITIS A ANTIBODY, TOTAL: Hepatitis A AB,Total: REACTIVE — AB

## 2021-06-29 LAB — CERULOPLASMIN: Ceruloplasmin: 27 mg/dL (ref 18–53)

## 2021-06-29 LAB — HEPATITIS B SURFACE ANTIBODY,QUALITATIVE: Hep B S Ab: REACTIVE — AB

## 2021-06-29 LAB — ANA: Anti Nuclear Antibody (ANA): NEGATIVE

## 2021-06-29 LAB — MITOCHONDRIAL ANTIBODIES: Mitochondrial M2 Ab, IgG: 46.1 U — ABNORMAL HIGH (ref <=20.0)

## 2021-06-29 LAB — HEPATITIS C ANTIBODY
Hepatitis C Ab: NONREACTIVE
SIGNAL TO CUT-OFF: 0.17 (ref <1.00)

## 2021-06-29 LAB — ALPHA-1-ANTITRYPSIN: A-1 Antitrypsin, Ser: 191 mg/dL (ref 83–199)

## 2021-06-30 ENCOUNTER — Other Ambulatory Visit: Payer: Self-pay

## 2021-06-30 ENCOUNTER — Ambulatory Visit (HOSPITAL_BASED_OUTPATIENT_CLINIC_OR_DEPARTMENT_OTHER)
Admission: RE | Admit: 2021-06-30 | Discharge: 2021-06-30 | Disposition: A | Discharge status: Home / Self Care | Payer: Medicare Other | Source: Ambulatory Visit | Attending: Family Medicine | Admitting: Family Medicine

## 2021-06-30 DIAGNOSIS — E876 Hypokalemia: Secondary | ICD-10-CM | POA: Diagnosis not present

## 2021-06-30 DIAGNOSIS — J9601 Acute respiratory failure with hypoxia: Secondary | ICD-10-CM | POA: Diagnosis not present

## 2021-06-30 DIAGNOSIS — K760 Fatty (change of) liver, not elsewhere classified: Secondary | ICD-10-CM | POA: Diagnosis not present

## 2021-06-30 DIAGNOSIS — R270 Ataxia, unspecified: Secondary | ICD-10-CM | POA: Diagnosis not present

## 2021-06-30 DIAGNOSIS — Z743 Need for continuous supervision: Secondary | ICD-10-CM | POA: Diagnosis not present

## 2021-06-30 DIAGNOSIS — I10 Essential (primary) hypertension: Secondary | ICD-10-CM

## 2021-06-30 DIAGNOSIS — R413 Other amnesia: Secondary | ICD-10-CM | POA: Insufficient documentation

## 2021-06-30 DIAGNOSIS — Z6822 Body mass index (BMI) 22.0-22.9, adult: Secondary | ICD-10-CM | POA: Diagnosis not present

## 2021-06-30 DIAGNOSIS — F10130 Alcohol abuse with withdrawal, uncomplicated: Secondary | ICD-10-CM | POA: Diagnosis present

## 2021-06-30 DIAGNOSIS — R109 Unspecified abdominal pain: Secondary | ICD-10-CM | POA: Diagnosis not present

## 2021-06-30 DIAGNOSIS — R000 Tachycardia, unspecified: Secondary | ICD-10-CM | POA: Diagnosis not present

## 2021-06-30 DIAGNOSIS — Z20822 Contact with and (suspected) exposure to covid-19: Secondary | ICD-10-CM | POA: Diagnosis not present

## 2021-06-30 DIAGNOSIS — R1310 Dysphagia, unspecified: Secondary | ICD-10-CM | POA: Diagnosis not present

## 2021-06-30 DIAGNOSIS — Z7401 Bed confinement status: Secondary | ICD-10-CM | POA: Diagnosis not present

## 2021-06-30 DIAGNOSIS — R404 Transient alteration of awareness: Secondary | ICD-10-CM | POA: Diagnosis not present

## 2021-06-30 DIAGNOSIS — E512 Wernicke's encephalopathy: Secondary | ICD-10-CM | POA: Diagnosis not present

## 2021-06-30 DIAGNOSIS — R1110 Vomiting, unspecified: Secondary | ICD-10-CM | POA: Diagnosis not present

## 2021-06-30 DIAGNOSIS — R0682 Tachypnea, not elsewhere classified: Secondary | ICD-10-CM | POA: Diagnosis not present

## 2021-06-30 DIAGNOSIS — M6281 Muscle weakness (generalized): Secondary | ICD-10-CM | POA: Diagnosis not present

## 2021-06-30 DIAGNOSIS — M6259 Muscle wasting and atrophy, not elsewhere classified, multiple sites: Secondary | ICD-10-CM | POA: Diagnosis not present

## 2021-06-30 DIAGNOSIS — R9082 White matter disease, unspecified: Secondary | ICD-10-CM | POA: Insufficient documentation

## 2021-06-30 DIAGNOSIS — R279 Unspecified lack of coordination: Secondary | ICD-10-CM | POA: Diagnosis not present

## 2021-06-30 DIAGNOSIS — R2681 Unsteadiness on feet: Secondary | ICD-10-CM | POA: Diagnosis not present

## 2021-06-30 DIAGNOSIS — J322 Chronic ethmoidal sinusitis: Secondary | ICD-10-CM | POA: Insufficient documentation

## 2021-06-30 DIAGNOSIS — R1312 Dysphagia, oropharyngeal phase: Secondary | ICD-10-CM | POA: Diagnosis not present

## 2021-06-30 DIAGNOSIS — R7989 Other specified abnormal findings of blood chemistry: Secondary | ICD-10-CM | POA: Diagnosis not present

## 2021-06-30 DIAGNOSIS — W19XXXA Unspecified fall, initial encounter: Secondary | ICD-10-CM | POA: Insufficient documentation

## 2021-06-30 DIAGNOSIS — Z79899 Other long term (current) drug therapy: Secondary | ICD-10-CM | POA: Diagnosis not present

## 2021-06-30 DIAGNOSIS — R296 Repeated falls: Secondary | ICD-10-CM | POA: Diagnosis not present

## 2021-06-30 DIAGNOSIS — R634 Abnormal weight loss: Secondary | ICD-10-CM | POA: Diagnosis not present

## 2021-06-30 DIAGNOSIS — R531 Weakness: Secondary | ICD-10-CM | POA: Diagnosis not present

## 2021-06-30 DIAGNOSIS — R2689 Other abnormalities of gait and mobility: Secondary | ICD-10-CM

## 2021-06-30 DIAGNOSIS — R29818 Other symptoms and signs involving the nervous system: Secondary | ICD-10-CM | POA: Insufficient documentation

## 2021-06-30 DIAGNOSIS — E440 Moderate protein-calorie malnutrition: Secondary | ICD-10-CM | POA: Diagnosis not present

## 2021-06-30 DIAGNOSIS — Z789 Other specified health status: Secondary | ICD-10-CM | POA: Diagnosis not present

## 2021-06-30 DIAGNOSIS — I700 Atherosclerosis of aorta: Secondary | ICD-10-CM | POA: Diagnosis not present

## 2021-06-30 DIAGNOSIS — R0602 Shortness of breath: Secondary | ICD-10-CM | POA: Diagnosis not present

## 2021-06-30 DIAGNOSIS — K838 Other specified diseases of biliary tract: Secondary | ICD-10-CM | POA: Diagnosis not present

## 2021-06-30 DIAGNOSIS — R29898 Other symptoms and signs involving the musculoskeletal system: Secondary | ICD-10-CM | POA: Diagnosis not present

## 2021-06-30 DIAGNOSIS — Z87891 Personal history of nicotine dependence: Secondary | ICD-10-CM | POA: Diagnosis not present

## 2021-06-30 DIAGNOSIS — J432 Centrilobular emphysema: Secondary | ICD-10-CM | POA: Diagnosis not present

## 2021-06-30 IMAGING — CT CT HEAD W/O CM
4 series · 16 of 47 positions shown, 18 images · non-contrast
Comparison: None.

CLINICAL DATA: Memory loss. Acute neurologic deficit. Unwitnessed
fall 1 week ago.



[Series 2: head wo · axial · 0.39mm/px · z∈[+1282,+1397]mm · 7 of 31 slices shown, 9 images]
[im 4/31  brain]
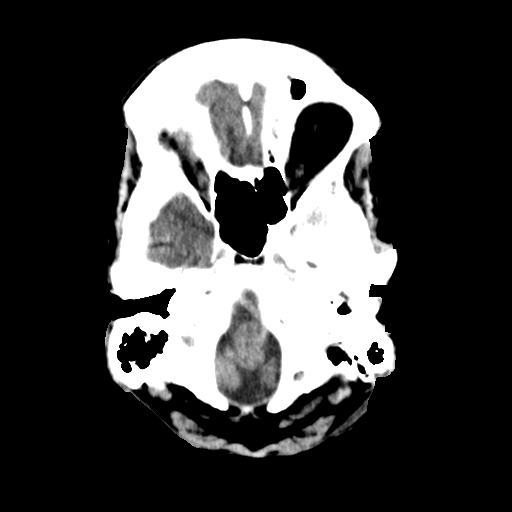
[im 4/31  bone]
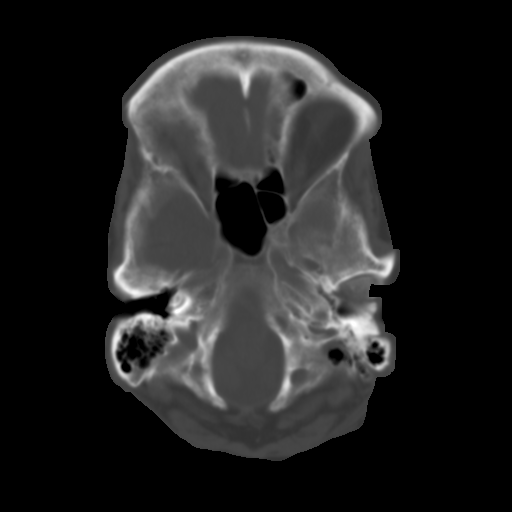
[im 8/31  brain]
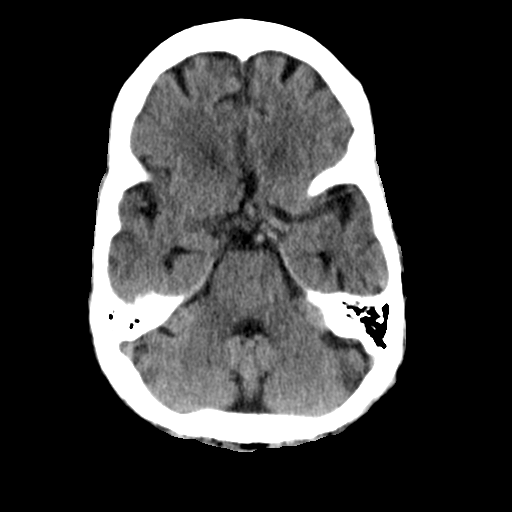
[im 12/31  brain]
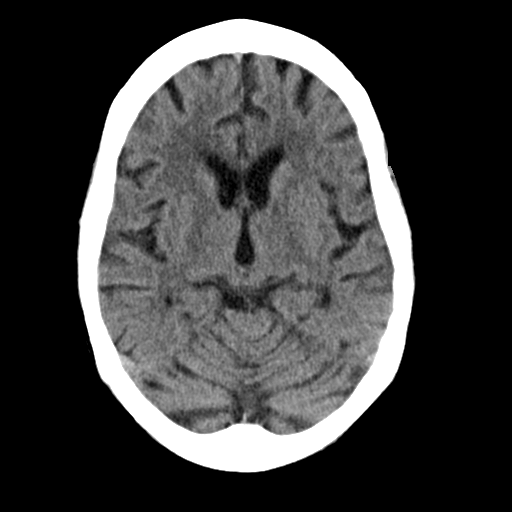
[im 16/31  brain]
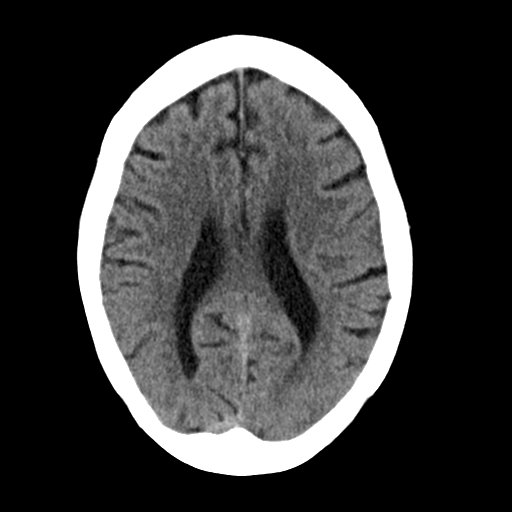
[im 19/31  brain]
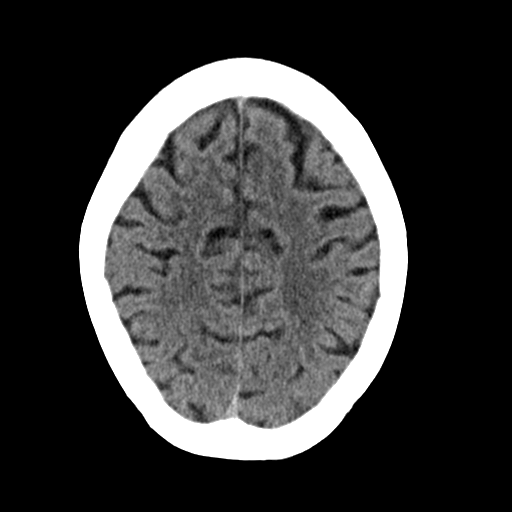
[im 19/31  bone]
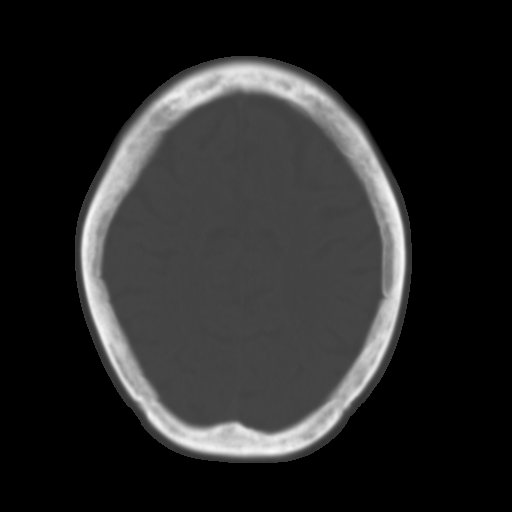
[im 23/31  brain]
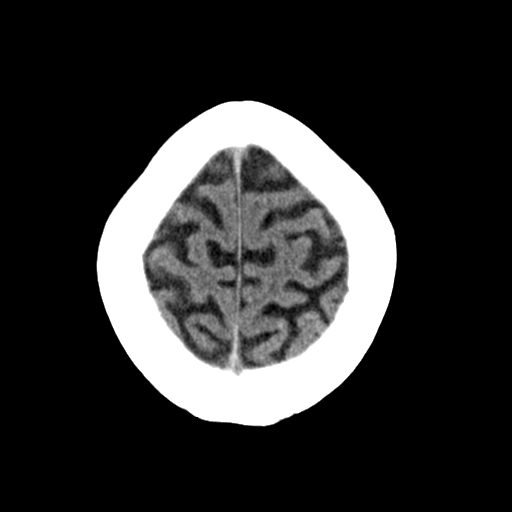
[im 27/31  brain]
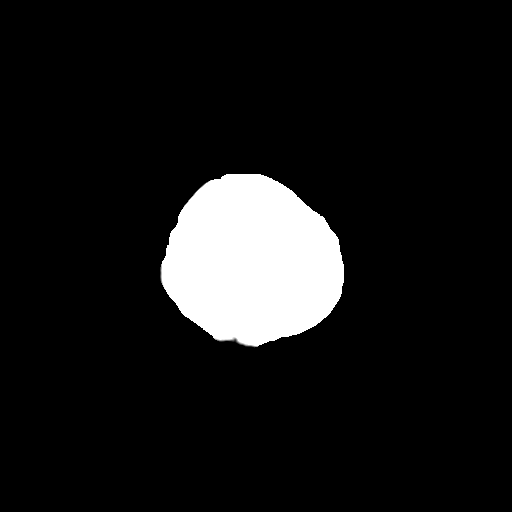

[Series 3: head bone · axial · 0.39mm/px · z∈[+1281,+1311]mm · 3 of 76 slices shown]
[im 8/76  bone]
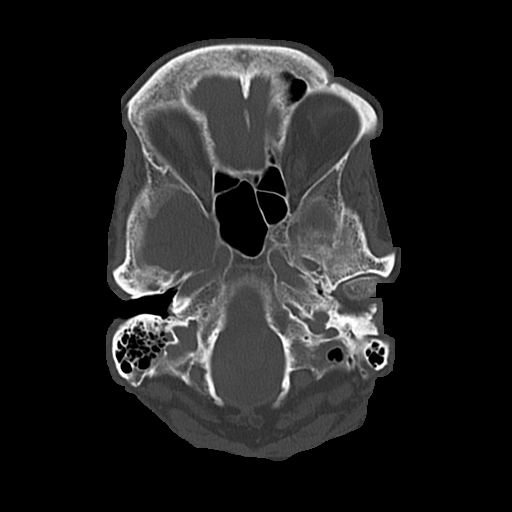
[im 16/76  bone]
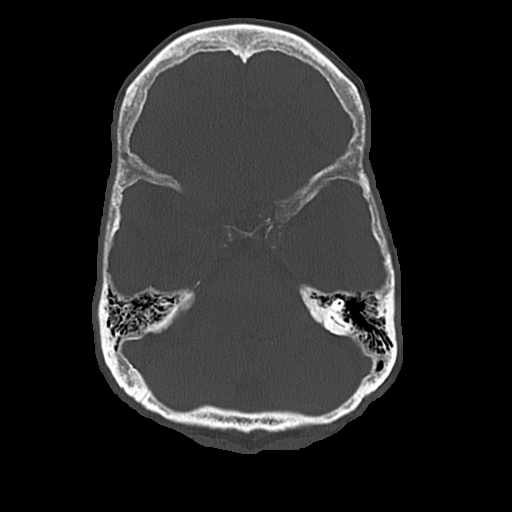
[im 23/76  bone]
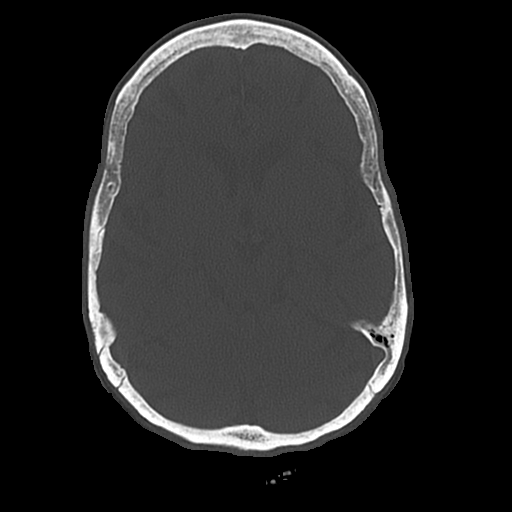

[Series 4: coronal soft · coronal · 0.29mm/px · 3 of 63 slices shown]
[im 21/63  brain]
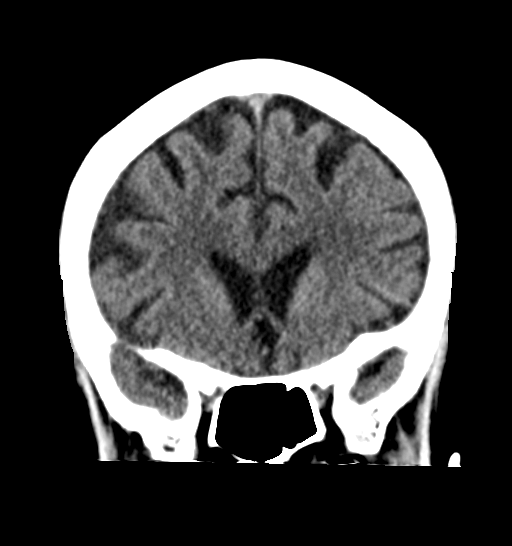
[im 28/63  brain]
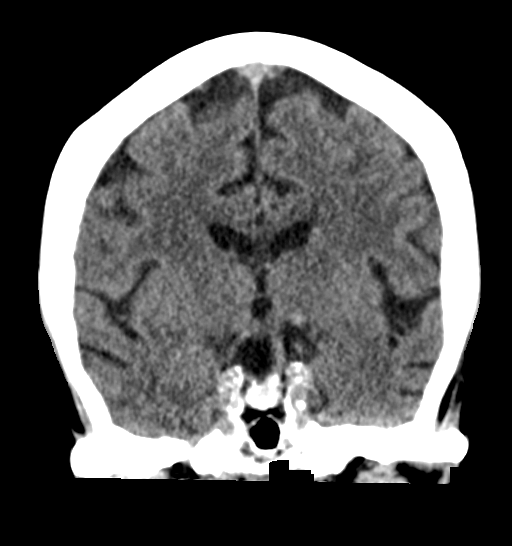
[im 35/63  brain]
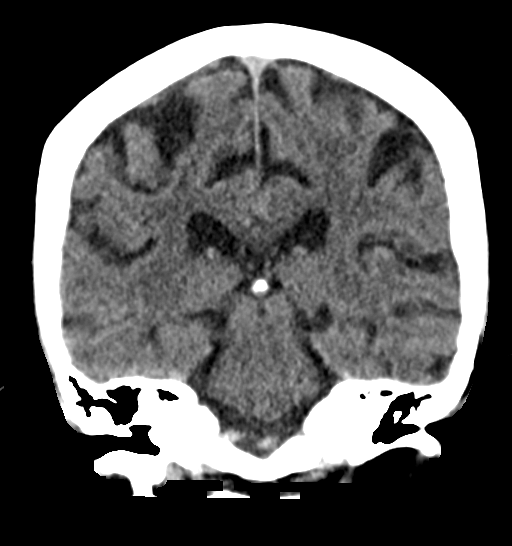

[Series 5: sagittal soft · sagittal · 0.34mm/px · 3 of 45 slices shown]
[im 15/45  brain]
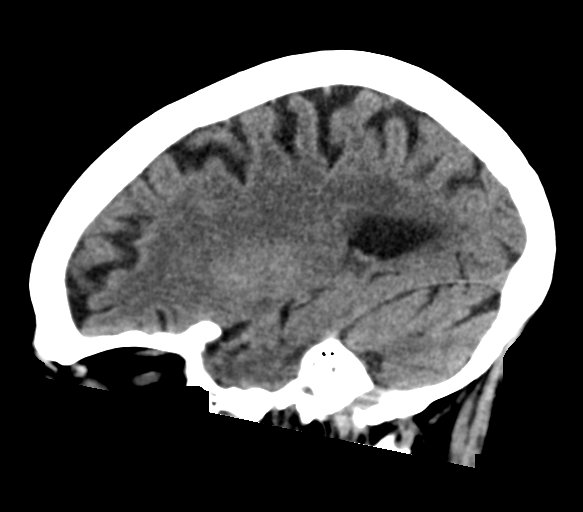
[im 23/45  brain]
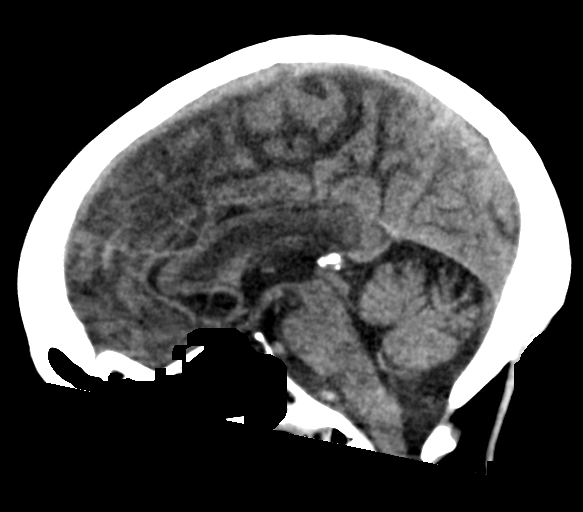
[im 30/45  brain]
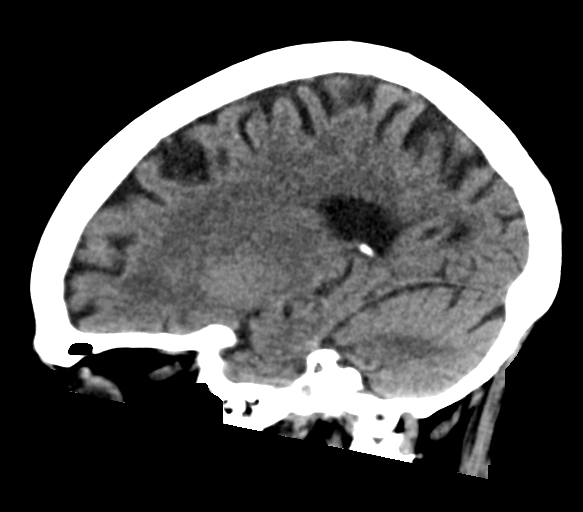

[16 of 47 positions shown; findings below may reference images not displayed]

FINDINGS: Brain: The brainstem, cerebellum, cerebral peduncles, thalami, basal
ganglia, basilar cisterns, and ventricular system appear within
normal limits. Periventricular white matter and corona radiata
hypodensities favor chronic ischemic microvascular white matter
disease. No intracranial hemorrhage, mass lesion, or acute CVA.

Vascular: There is atherosclerotic calcification of the cavernous
carotid arteries bilaterally.

Skull: Unremarkable

Sinuses/Orbits: Old left medial orbital wall fracture. Mild chronic
right posterior ethmoid sinusitis.

Other: No supplemental non-categorized findings.
IMPRESSION: 1. No acute intracranial findings.
2. Periventricular white matter and corona radiata hypodensities
favor chronic ischemic microvascular white matter disease.
3. Atherosclerosis.
4. Old healed left medial orbital wall fracture.
5. Mild chronic right ethmoid sinusitis.

## 2021-07-02 ENCOUNTER — Emergency Department (HOSPITAL_COMMUNITY): Payer: Medicare Other

## 2021-07-02 ENCOUNTER — Encounter (HOSPITAL_COMMUNITY): Payer: Self-pay

## 2021-07-02 ENCOUNTER — Inpatient Hospital Stay (HOSPITAL_COMMUNITY)
Admission: EM | Admit: 2021-07-02 | Discharge: 2021-07-10 | DRG: 640 | Disposition: A | Discharge status: Skilled Nursing Facility | Payer: Medicare Other | Attending: Family Medicine | Admitting: Family Medicine

## 2021-07-02 ENCOUNTER — Other Ambulatory Visit: Payer: Self-pay

## 2021-07-02 DIAGNOSIS — E440 Moderate protein-calorie malnutrition: Secondary | ICD-10-CM | POA: Diagnosis present

## 2021-07-02 DIAGNOSIS — E512 Wernicke's encephalopathy: Principal | ICD-10-CM | POA: Diagnosis present

## 2021-07-02 DIAGNOSIS — Z6822 Body mass index (BMI) 22.0-22.9, adult: Secondary | ICD-10-CM

## 2021-07-02 DIAGNOSIS — Z87891 Personal history of nicotine dependence: Secondary | ICD-10-CM | POA: Diagnosis not present

## 2021-07-02 DIAGNOSIS — E876 Hypokalemia: Secondary | ICD-10-CM | POA: Diagnosis present

## 2021-07-02 DIAGNOSIS — F10130 Alcohol abuse with withdrawal, uncomplicated: Secondary | ICD-10-CM | POA: Diagnosis present

## 2021-07-02 DIAGNOSIS — Z79899 Other long term (current) drug therapy: Secondary | ICD-10-CM | POA: Diagnosis not present

## 2021-07-02 DIAGNOSIS — Z789 Other specified health status: Secondary | ICD-10-CM | POA: Diagnosis not present

## 2021-07-02 DIAGNOSIS — R7989 Other specified abnormal findings of blood chemistry: Secondary | ICD-10-CM | POA: Diagnosis not present

## 2021-07-02 DIAGNOSIS — R296 Repeated falls: Secondary | ICD-10-CM | POA: Diagnosis present

## 2021-07-02 DIAGNOSIS — R531 Weakness: Secondary | ICD-10-CM

## 2021-07-02 DIAGNOSIS — R1310 Dysphagia, unspecified: Secondary | ICD-10-CM | POA: Diagnosis present

## 2021-07-02 DIAGNOSIS — J432 Centrilobular emphysema: Secondary | ICD-10-CM | POA: Diagnosis present

## 2021-07-02 DIAGNOSIS — Z20822 Contact with and (suspected) exposure to covid-19: Secondary | ICD-10-CM | POA: Diagnosis present

## 2021-07-02 DIAGNOSIS — I10 Essential (primary) hypertension: Secondary | ICD-10-CM | POA: Diagnosis present

## 2021-07-02 DIAGNOSIS — R0682 Tachypnea, not elsewhere classified: Secondary | ICD-10-CM

## 2021-07-02 DIAGNOSIS — R634 Abnormal weight loss: Secondary | ICD-10-CM

## 2021-07-02 DIAGNOSIS — J9601 Acute respiratory failure with hypoxia: Secondary | ICD-10-CM | POA: Diagnosis present

## 2021-07-02 DIAGNOSIS — F10939 Alcohol use, unspecified with withdrawal, unspecified: Secondary | ICD-10-CM

## 2021-07-02 DIAGNOSIS — R270 Ataxia, unspecified: Secondary | ICD-10-CM | POA: Diagnosis present

## 2021-07-02 DIAGNOSIS — K838 Other specified diseases of biliary tract: Secondary | ICD-10-CM | POA: Diagnosis present

## 2021-07-02 DIAGNOSIS — R000 Tachycardia, unspecified: Secondary | ICD-10-CM | POA: Insufficient documentation

## 2021-07-02 DIAGNOSIS — K760 Fatty (change of) liver, not elsewhere classified: Secondary | ICD-10-CM | POA: Diagnosis present

## 2021-07-02 LAB — CBC WITH DIFFERENTIAL/PLATELET
Abs Immature Granulocytes: 0.09 10*3/uL — ABNORMAL HIGH (ref 0.00–0.07)
Basophils Absolute: 0.2 10*3/uL — ABNORMAL HIGH (ref 0.0–0.1)
Basophils Relative: 2 %
Eosinophils Absolute: 0.3 10*3/uL (ref 0.0–0.5)
Eosinophils Relative: 3 %
HCT: 40.7 % (ref 36.0–46.0)
Hemoglobin: 13.9 g/dL (ref 12.0–15.0)
Immature Granulocytes: 1 %
Lymphocytes Relative: 32 %
Lymphs Abs: 2.6 10*3/uL (ref 0.7–4.0)
MCH: 32.9 pg (ref 26.0–34.0)
MCHC: 34.2 g/dL (ref 30.0–36.0)
MCV: 96.4 fL (ref 80.0–100.0)
Monocytes Absolute: 1.1 10*3/uL — ABNORMAL HIGH (ref 0.1–1.0)
Monocytes Relative: 13 %
Neutro Abs: 4.1 10*3/uL (ref 1.7–7.7)
Neutrophils Relative %: 49 %
Platelets: 251 10*3/uL (ref 150–400)
RBC: 4.22 MIL/uL (ref 3.87–5.11)
RDW: 18.9 % — ABNORMAL HIGH (ref 11.5–15.5)
WBC: 8.4 10*3/uL (ref 4.0–10.5)
nRBC: 0 % (ref 0.0–0.2)

## 2021-07-02 LAB — I-STAT CHEM 8, ED
BUN: 4 mg/dL — ABNORMAL LOW (ref 8–23)
Calcium, Ion: 0.97 mmol/L — ABNORMAL LOW (ref 1.15–1.40)
Chloride: 101 mmol/L (ref 98–111)
Creatinine, Ser: 0.5 mg/dL (ref 0.44–1.00)
Glucose, Bld: 95 mg/dL (ref 70–99)
HCT: 46 % (ref 36.0–46.0)
Hemoglobin: 15.6 g/dL — ABNORMAL HIGH (ref 12.0–15.0)
Potassium: 3.5 mmol/L (ref 3.5–5.1)
Sodium: 139 mmol/L (ref 135–145)
TCO2: 30 mmol/L (ref 22–32)

## 2021-07-02 LAB — COMPREHENSIVE METABOLIC PANEL
ALT: 24 U/L (ref 0–44)
AST: 66 U/L — ABNORMAL HIGH (ref 15–41)
Albumin: 2.3 g/dL — ABNORMAL LOW (ref 3.5–5.0)
Alkaline Phosphatase: 122 U/L (ref 38–126)
Anion gap: 7 (ref 5–15)
BUN: 6 mg/dL — ABNORMAL LOW (ref 8–23)
CO2: 27 mmol/L (ref 22–32)
Calcium: 8.1 mg/dL — ABNORMAL LOW (ref 8.9–10.3)
Chloride: 101 mmol/L (ref 98–111)
Creatinine, Ser: 0.52 mg/dL (ref 0.44–1.00)
GFR, Estimated: >60 mL/min (ref >60)
Glucose, Bld: 103 mg/dL — ABNORMAL HIGH (ref 70–99)
Potassium: 3.9 mmol/L (ref 3.5–5.1)
Sodium: 135 mmol/L (ref 135–145)
Total Bilirubin: 2.2 mg/dL — ABNORMAL HIGH (ref 0.3–1.2)
Total Protein: 6.7 g/dL (ref 6.5–8.1)

## 2021-07-02 LAB — ETHANOL: Alcohol, Ethyl (B): <10 mg/dL (ref <10)

## 2021-07-02 LAB — LIPASE, BLOOD: Lipase: 21 U/L (ref 11–51)

## 2021-07-02 IMAGING — CT CT HEAD W/O CM
3 series · 15 of 47 positions shown, 18 images · non-contrast
Comparison: CT head [DATE]

CLINICAL DATA: Mental status change, unknown cause



[Series 3: head wo · axial · 0.42mm/px · z∈[-128,-3]mm · 9 of 31 slices shown, 12 images]
[im 3/31  brain]
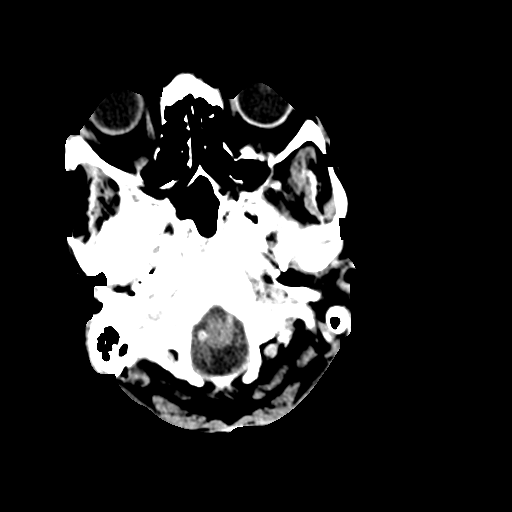
[im 3/31  bone]
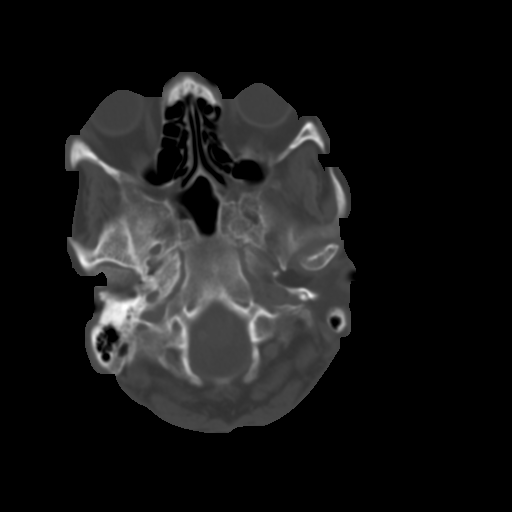
[im 6/31  brain]
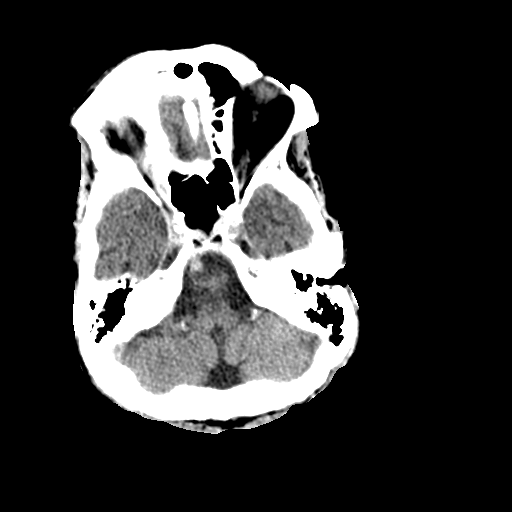
[im 9/31  brain]
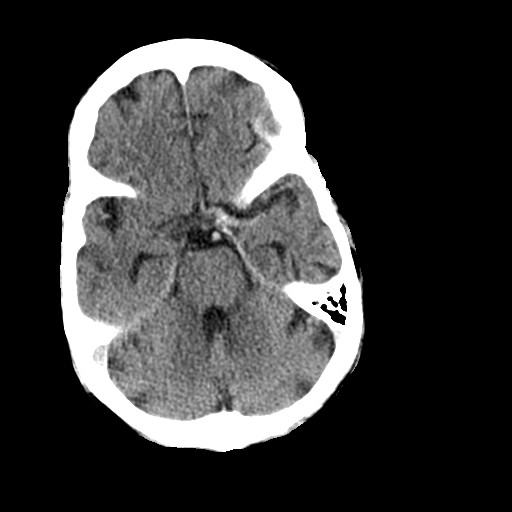
[im 12/31  brain]
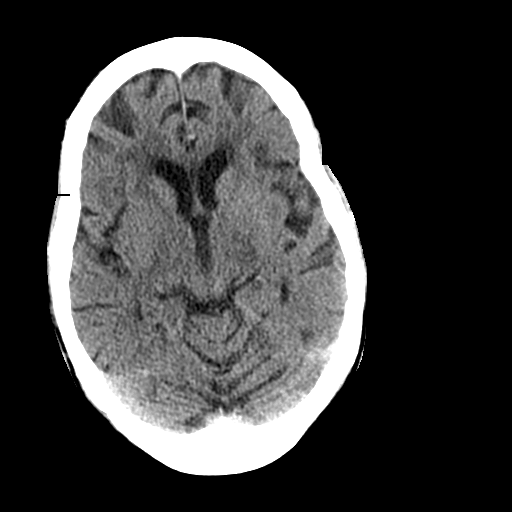
[im 16/31  brain]
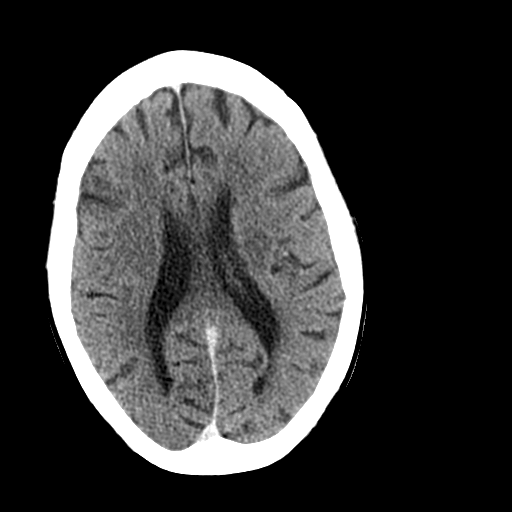
[im 16/31  bone]
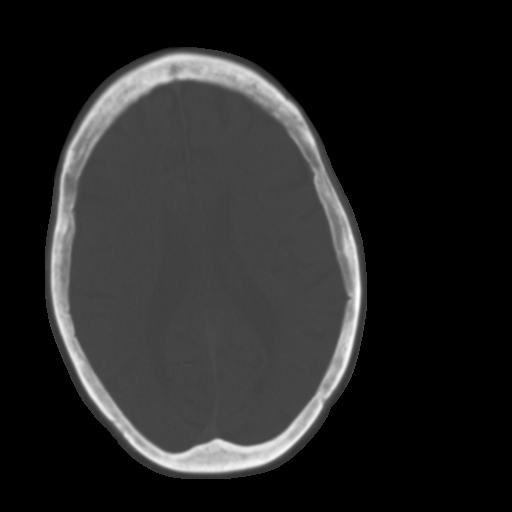
[im 19/31  brain]
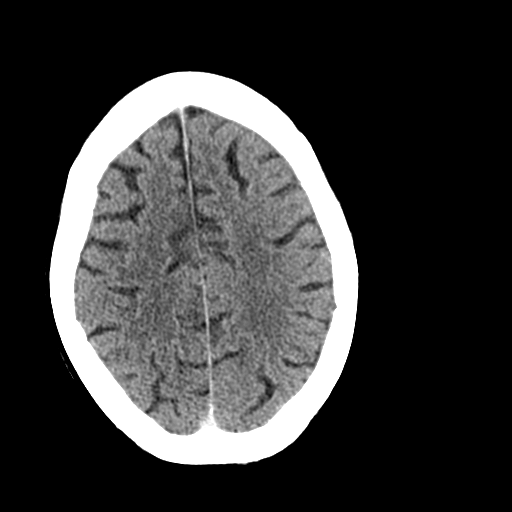
[im 22/31  brain]
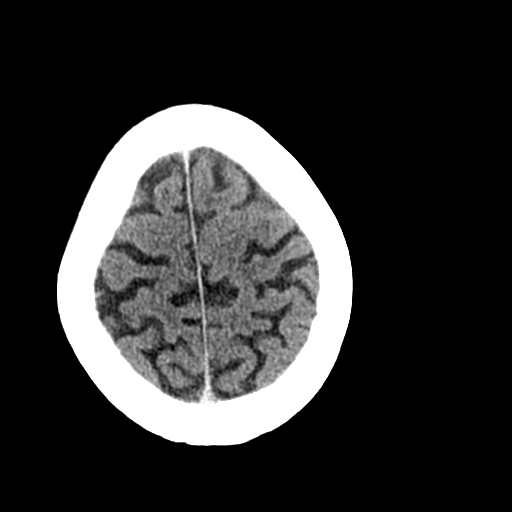
[im 25/31  brain]
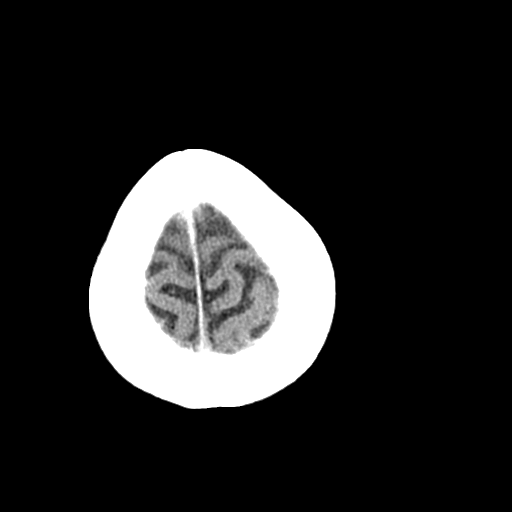
[im 28/31  brain]
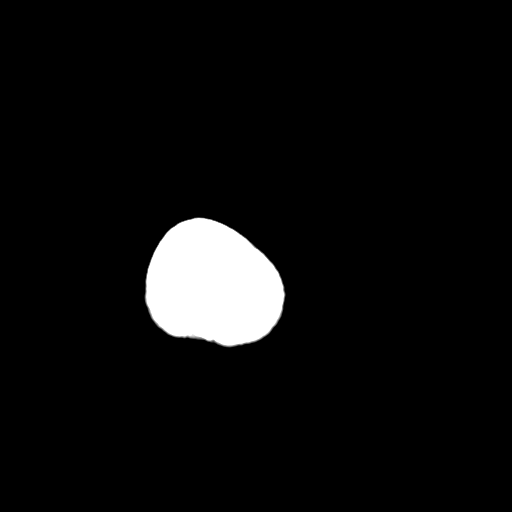
[im 28/31  bone]
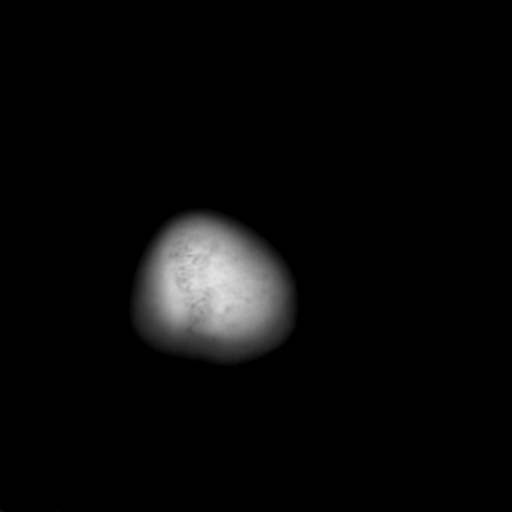

[Series 5: coronal soft tissue · coronal · 0.31mm/px · 3 of 68 slices shown]
[im 23/68  brain]
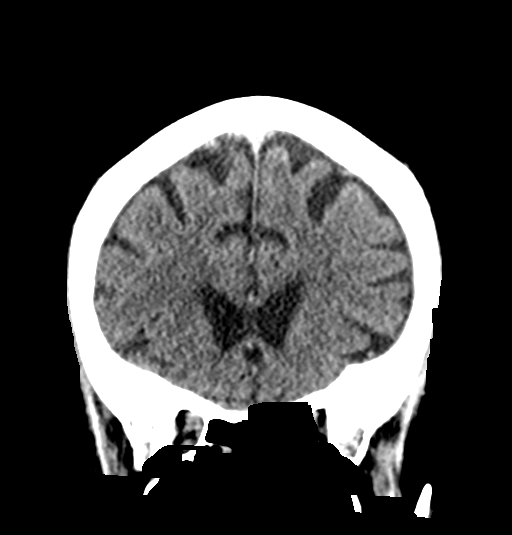
[im 30/68  brain]
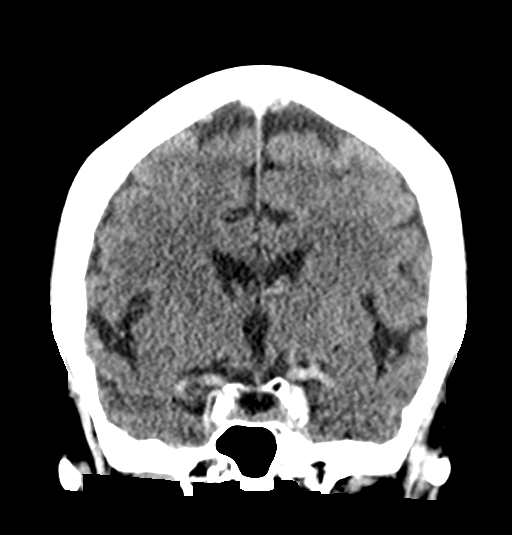
[im 38/68  brain]
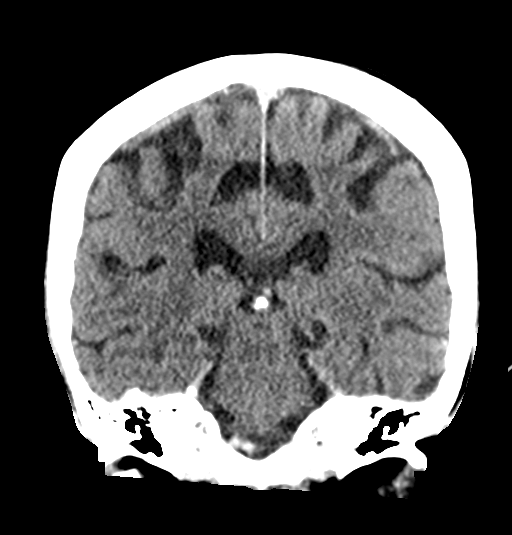

[Series 6: sagittal soft tissue · sagittal · 0.32mm/px · 3 of 53 slices shown]
[im 18/53  brain]
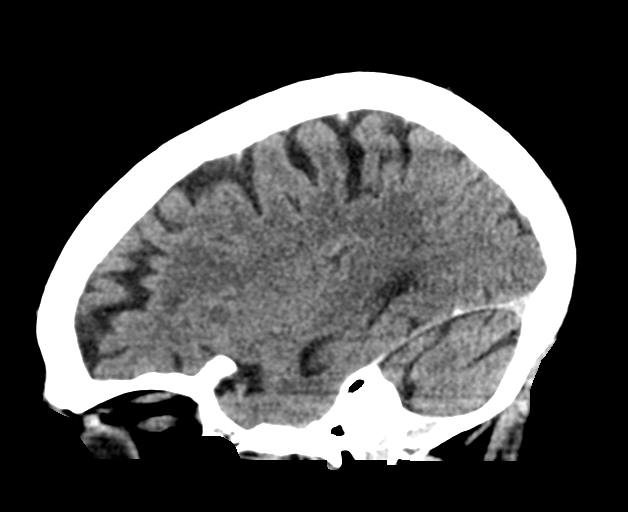
[im 27/53  brain]
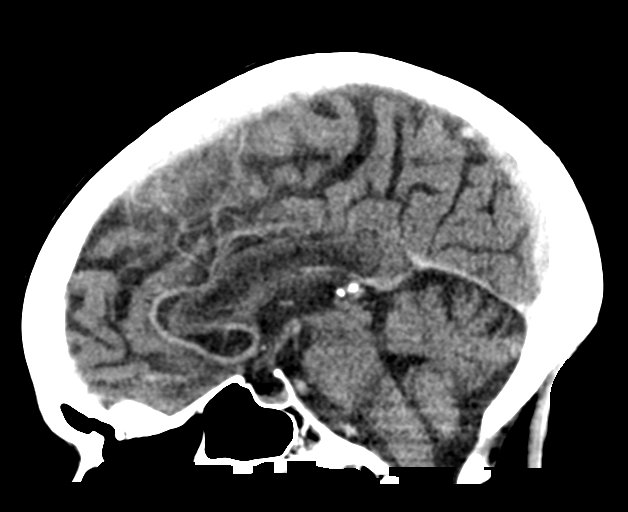
[im 35/53  brain]
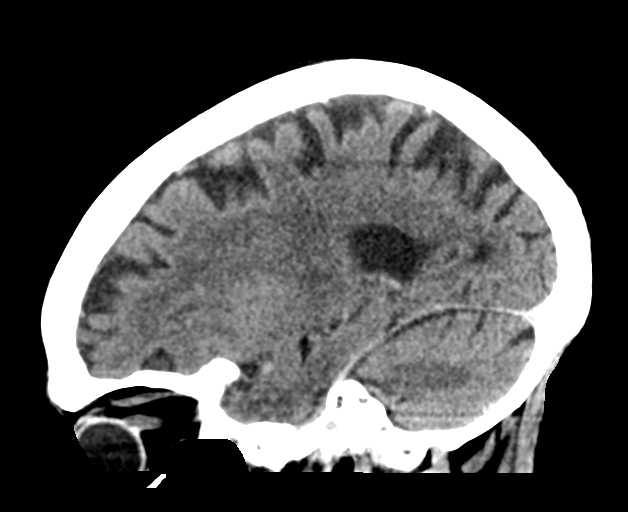

[15 of 47 positions shown; findings below may reference images not displayed]

FINDINGS: Brain:

Patchy and confluent areas of decreased attenuation are noted
throughout the deep and periventricular white matter of the cerebral
hemispheres bilaterally, compatible with chronic microvascular
ischemic disease. No evidence of large-territorial acute infarction.
No parenchymal hemorrhage. No mass lesion. No extra-axial
collection.

No mass effect or midline shift. No hydrocephalus. Basilar cisterns
are patent.

Vascular: No hyperdense vessel. Atherosclerotic calcifications are
present within the cavernous internal carotid arteries.

Skull: No acute fracture or focal lesion. Old left lamina papyracea
fracture.

Sinuses/Orbits: Paranasal sinuses and mastoid air cells are clear.
The orbits are unremarkable.

Other: None.
IMPRESSION: No acute intracranial abnormality.

## 2021-07-02 IMAGING — DX DG CHEST 1V PORT
1 series · 1 of 1 positions shown · non-contrast
Comparison: Chest x-ray [DATE].

CLINICAL DATA: Shortness of breath.

EXAM:
PORTABLE CHEST 1 VIEW

[chest ap]
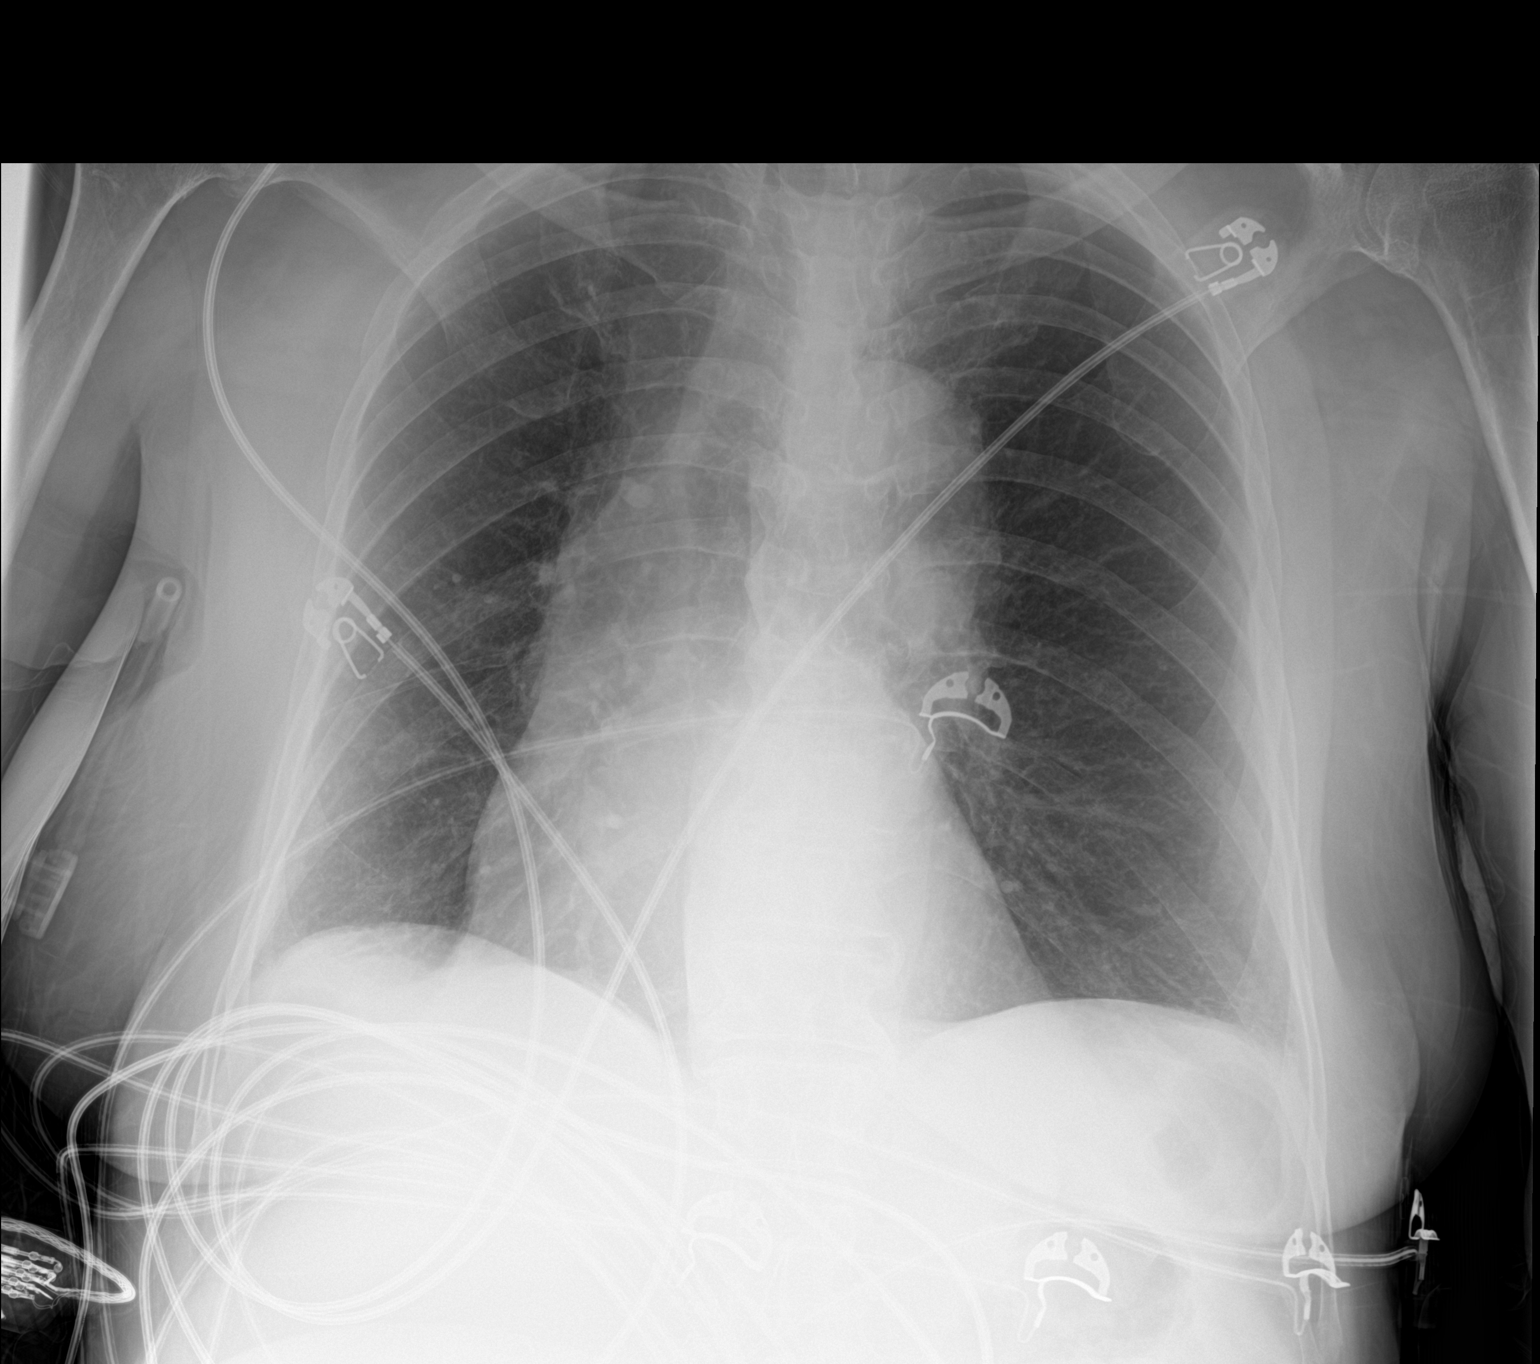

[1 of 1 positions shown; findings below may reference images not displayed]

FINDINGS: The heart size and mediastinal contours are within normal limits.
Both lungs are clear. The visualized skeletal structures are
unremarkable.
IMPRESSION: No active disease.

## 2021-07-02 IMAGING — CT CT ABD-PELV W/ CM
2 of 5 series · 16 of 46 positions shown, 18 images · IV contrast (agent unspecified)
Comparison: [DATE]

CLINICAL DATA: Nausea and vomiting with abdominal pain

EXAM:
CT ABDOMEN AND PELVIS WITH CONTRAST
TECHNIQUE: Multidetector CT imaging of the abdomen and pelvis was performed
using the standard protocol following bolus administration of
intravenous contrast.

[Series 2: axial st · axial · 0.74mm/px · z∈[+1020,+1370]mm · 13 of 80 slices shown, 15 images]
[im 5/80  soft-tissue]
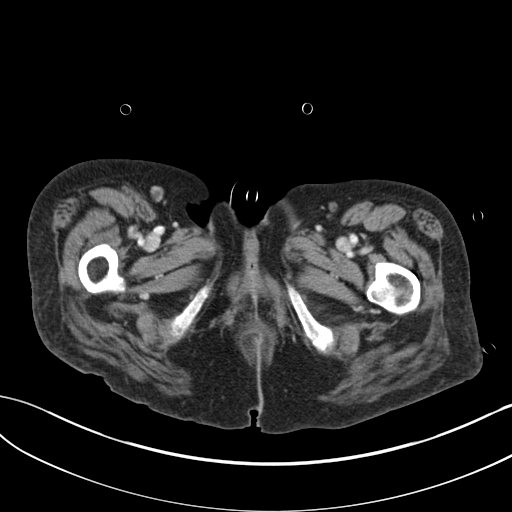
[im 5/80  bone]
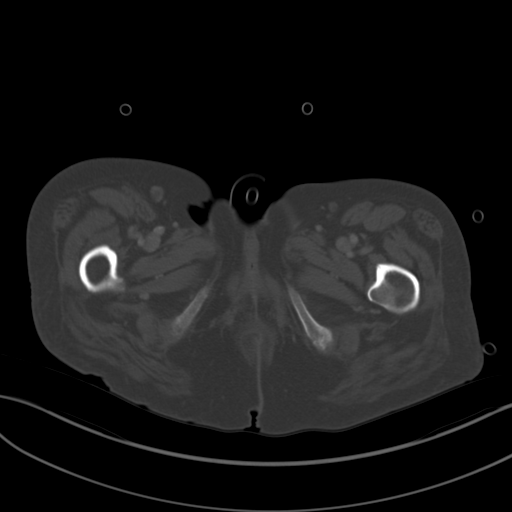
[im 10/80  soft-tissue]
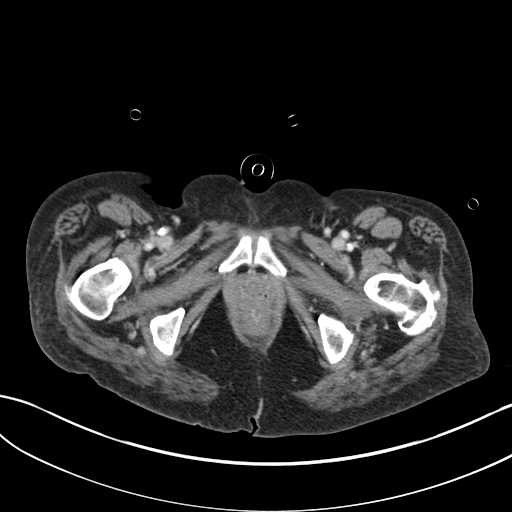
[im 15/80  soft-tissue]
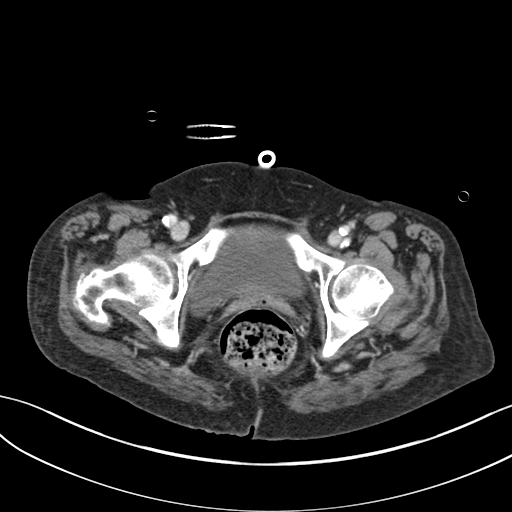
[im 25/80  soft-tissue]
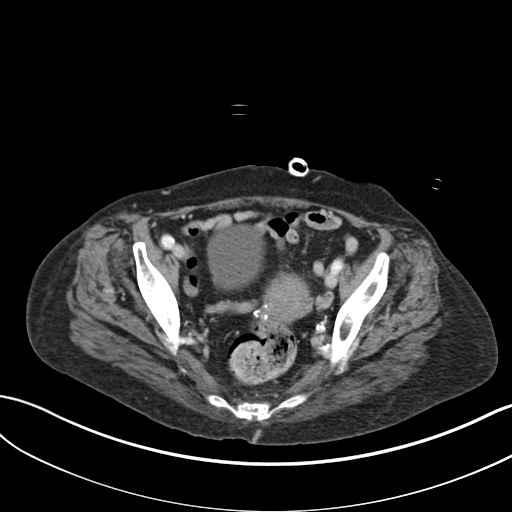
[im 30/80  soft-tissue]
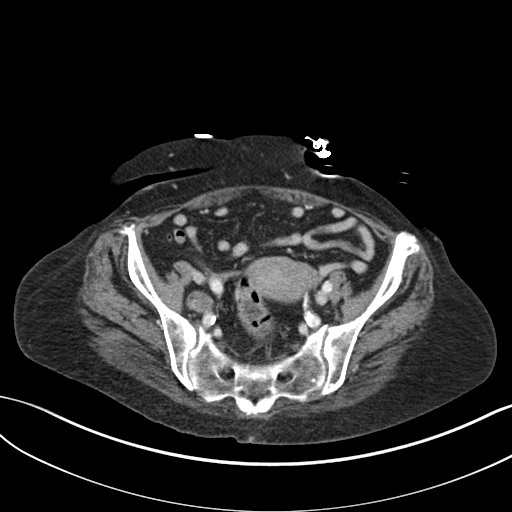
[im 35/80  soft-tissue]
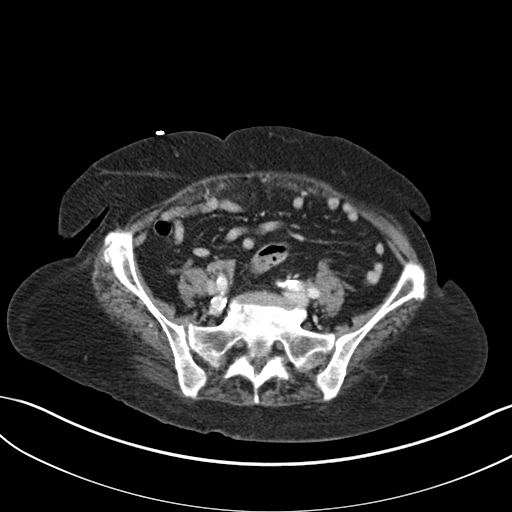
[im 40/80  soft-tissue]
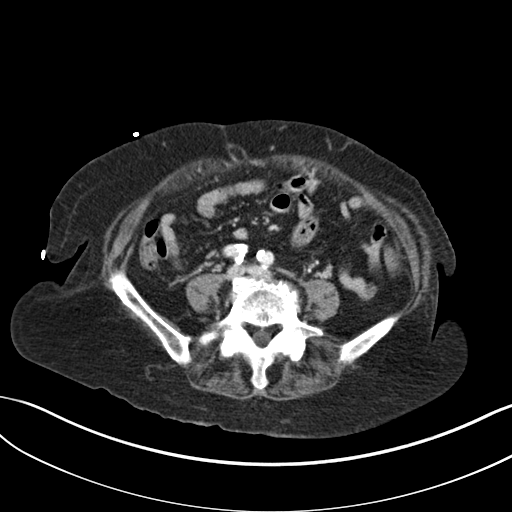
[im 45/80  soft-tissue]
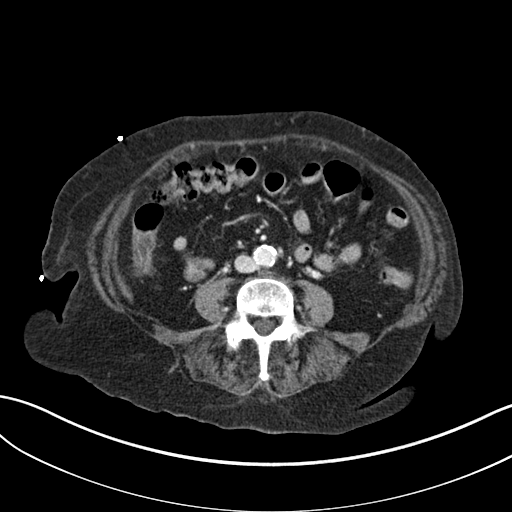
[im 50/80  soft-tissue]
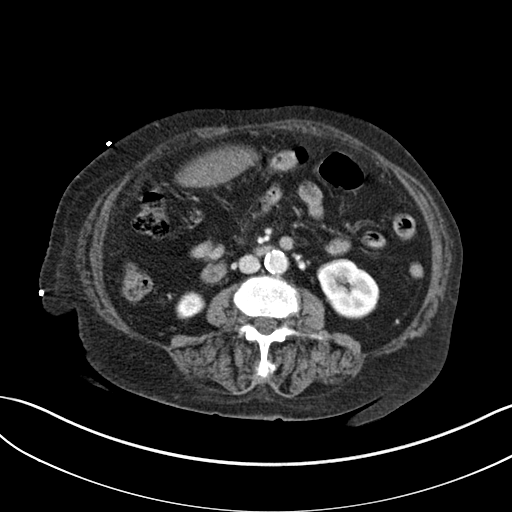
[im 50/80  bone]
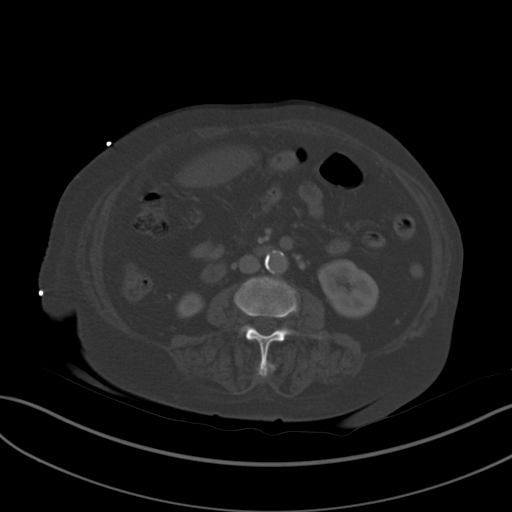
[im 55/80  soft-tissue]
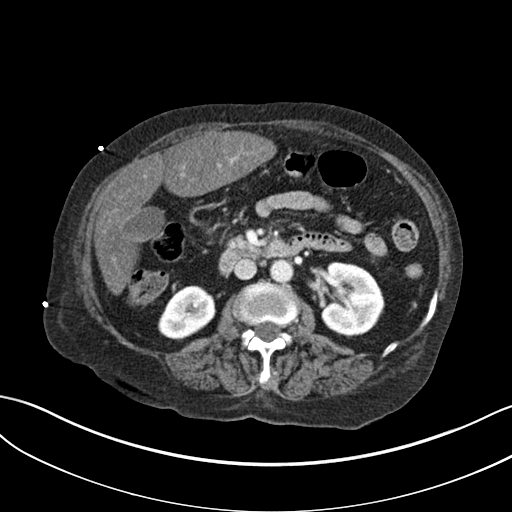
[im 65/80  soft-tissue]
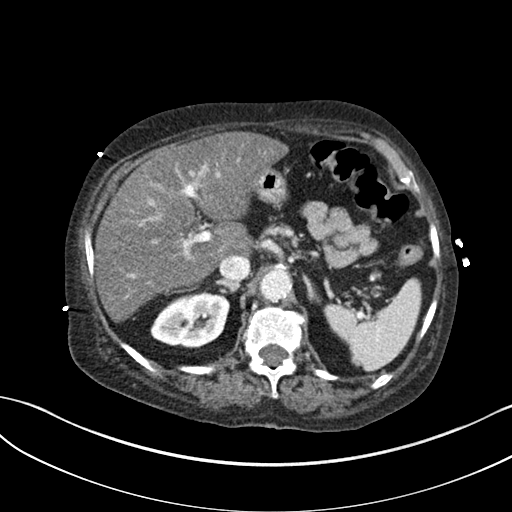
[im 70/80  soft-tissue]
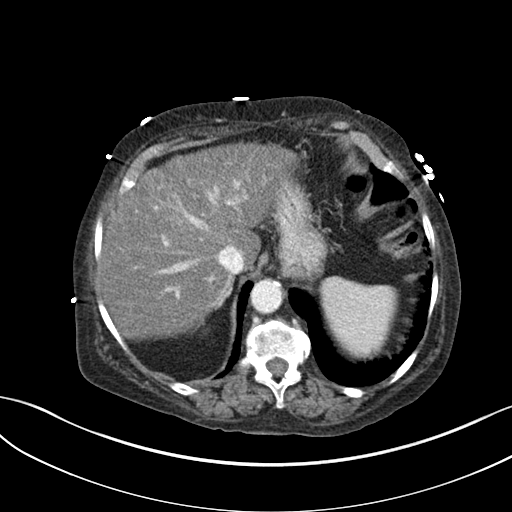
[im 75/80  soft-tissue]
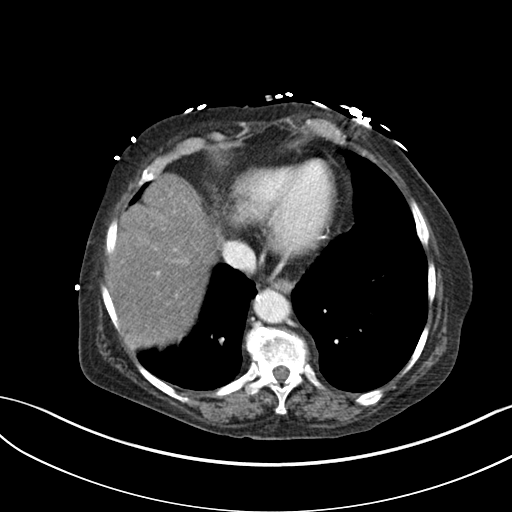

[Series 5: coronal st · coronal · 0.68mm/px · 3 of 141 slices shown]
[im 47/141  soft-tissue]
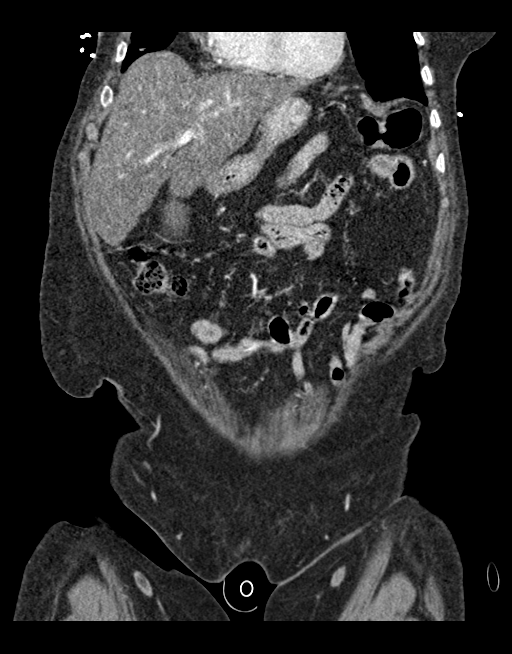
[im 63/141  soft-tissue]
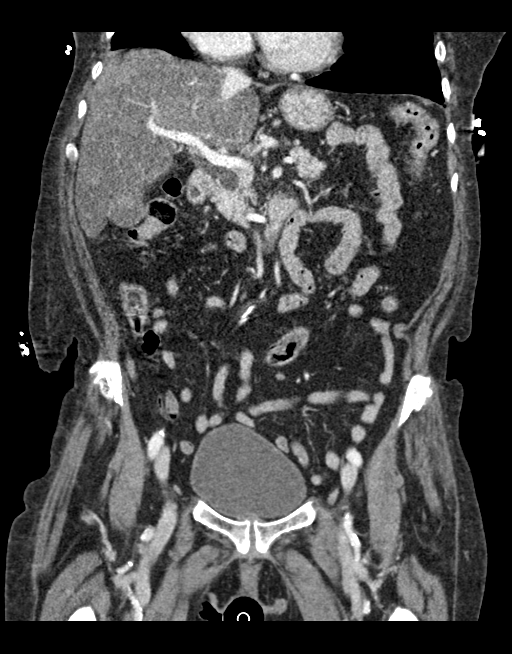
[im 78/141  soft-tissue]
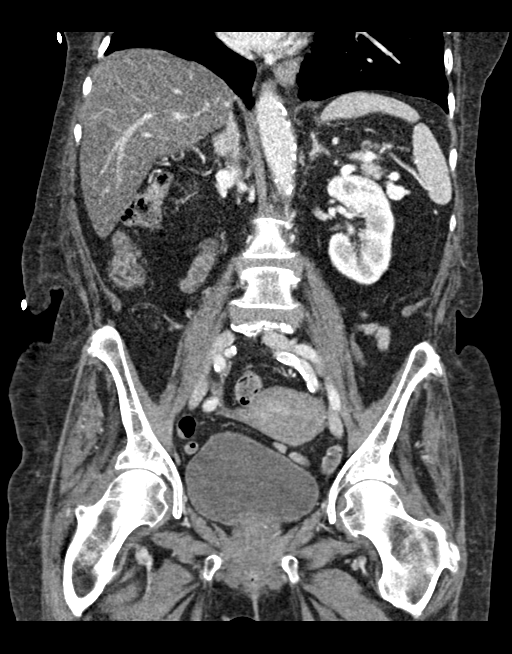

[16 of 46 positions shown; findings below may reference images not displayed]

RADIATION DOSE REDUCTION: This exam was performed according to the
departmental dose-optimization program which includes automated
exposure control, adjustment of the mA and/or kV according to
patient size and/or use of iterative reconstruction technique.

CONTRAST:  100mL OMNIPAQUE IOHEXOL 300 MG/ML  SOLN
FINDINGS: Lower chest: No acute abnormality.

Hepatobiliary: Fatty infiltration of the liver is noted. Gallbladder
is within normal limits. Mild fullness of the common bile duct is
noted although stable in appearance from the prior exam.

Pancreas: Unremarkable. No pancreatic ductal dilatation or
surrounding inflammatory changes.

Spleen: Normal in size without focal abnormality.

Adrenals/Urinary Tract: Adrenal glands are within normal limits.
Kidneys demonstrate a normal enhancement pattern bilaterally. No
renal calculi or obstructive changes are seen. Delayed images
demonstrate normal excretion of contrast. The bladder is well
distended.

Stomach/Bowel: The appendix is within normal limits. No obstructive
or inflammatory changes of the colon are seen. Small bowel is within
normal limits. Stomach is unremarkable.

Vascular/Lymphatic: Aortic atherosclerosis. No enlarged abdominal or
pelvic lymph nodes.

Reproductive: Uterus and bilateral adnexa are unremarkable.

Other: No abdominal wall hernia or abnormality. No abdominopelvic
ascites.

Musculoskeletal: No acute bony abnormality is noted. Degenerative
changes of lumbar spine are seen.
IMPRESSION: Fatty infiltration of the liver.

Mildly prominent common bile duct but stable from the prior exam
likely consistent with the patient's age.

No acute abnormality noted.

## 2021-07-02 MED ORDER — ENOXAPARIN SODIUM 40 MG/0.4ML IJ SOSY
40.0000 mg | PREFILLED_SYRINGE | INTRAMUSCULAR | Status: DC
  Administered 2021-07-03 – 2021-07-10 (×8): 40 mg via SUBCUTANEOUS
  Filled 2021-07-02 (×8): qty 0.4

## 2021-07-02 MED ORDER — ADULT MULTIVITAMIN W/MINERALS CH
1.0000 | ORAL_TABLET | Freq: Every day | ORAL | Status: DC
  Administered 2021-07-03 – 2021-07-09 (×7): 1 via ORAL
  Filled 2021-07-02 (×7): qty 1

## 2021-07-02 MED ORDER — FOLIC ACID 1 MG PO TABS
1.0000 mg | ORAL_TABLET | Freq: Every day | ORAL | Status: DC
  Administered 2021-07-03 – 2021-07-09 (×7): 1 mg via ORAL
  Filled 2021-07-02 (×7): qty 1

## 2021-07-02 MED ORDER — AMLODIPINE BESYLATE 5 MG PO TABS
5.0000 mg | ORAL_TABLET | Freq: Every day | ORAL | Status: DC
  Administered 2021-07-03 – 2021-07-04 (×2): 5 mg via ORAL
  Filled 2021-07-02 (×2): qty 1

## 2021-07-02 MED ORDER — THIAMINE HCL 100 MG/ML IJ SOLN: 100.0000 mg | Freq: Every day | INTRAMUSCULAR | Status: DC

## 2021-07-02 MED ORDER — IOHEXOL 300 MG/ML  SOLN
100.0000 mL | Freq: Once | INTRAMUSCULAR | Status: AC | PRN
  Administered 2021-07-02: 100 mL via INTRAVENOUS

## 2021-07-02 MED ORDER — THIAMINE HCL 100 MG/ML IJ SOLN
500.0000 mg | Freq: Three times a day (TID) | INTRAVENOUS | Status: DC
  Administered 2021-07-03 – 2021-07-05 (×10): 500 mg via INTRAVENOUS
  Filled 2021-07-02 (×14): qty 5

## 2021-07-02 MED ORDER — LORAZEPAM 1 MG PO TABS
1.0000 mg | ORAL_TABLET | ORAL | Status: AC | PRN
  Administered 2021-07-03 (×2): 1 mg via ORAL
  Filled 2021-07-02 (×2): qty 1

## 2021-07-02 MED ORDER — LORAZEPAM 2 MG/ML IJ SOLN: 1.0000 mg | INTRAMUSCULAR | Status: AC | PRN

## 2021-07-02 NOTE — ED Triage Notes (Signed)
Patient BIB [facility] from home. Patient does not know why she had to come. But per EMS, family stated she had a UTI last week but has not been taking her medication for it. Patient has barrow swallow test, pelvic CT scan tomorrow because within the last 3 weeks patient has not been eating/drinking as much as usual.

## 2021-07-02 NOTE — H&P (Signed)
History and Physical    Natasha Holland HFW:[id number] DOB: 02-24-1951 DOA: 07/02/2021  PCP: Billie Ruddy, MD  Patient coming from: Home   I have personally briefly reviewed patient's old medical records in [place]  Chief Complaint: Home  HPI: Natasha Holland is a 71 y.o. female with medical history significant for hypertension, alcohol use who presents with concerns of weakness and recurrent falls.  Patient is unclear why she is here.  Thinks it is because she was hurting all over earlier. Per documentation, patient recently saw her PCP earlier this week for ongoing balance issues, dizziness and memory loss.  She had a CT head ordered on 1/27 that was negative. She was also referred and seen by GI on 1/23 for issues of dysphagia and weight loss.  She has planned outpatient barium swallow and CT abdomen and pelvis.  Also has echocardiogram pending to assess her cardiac risks prior to endoscopy. Patient documented to have about 2-3 alcoholic drinks per day.  She reports to me about 12 pack/week.  She is denying issues with dysphagia, weight loss or falls.   ED Course: She was afebrile, tachycardic mildly hypertensive with BP of 134/101 on room air. CBC unremarkable.  Sodium of 135, K of 3.9, creatinine of 0.52, BG of 103. AST is mildly elevated at 66, ALT and alk phos was normal.  Total bili of 2.2.  CT of the abdomen/pelvis was obtained showing mildly prominent common bile duct that is stable from prior exam.  There is also fatty infiltrate of the liver.  CT head negative.  ED physician Dr. Darl Householder discussed with neurology Dr. Leonel Ramsay regarding concerns of Harmon Pier and was advised to start high-dose IV thiamine.  Also recommend MRI. He also discussed case with Dr. Lyndel Safe from GI who did not have any other further recommendations and to continue with barium swallow outpatient.  Review of Systems: Unable to obtain for ROS since patient is limited historian  Social  Hx Lives with Son. Denies tobacco use or illicit drug use. Has about 12 pack of alcohol per week   Past Medical History:  Diagnosis Date   Hypertension    Polycythemia vera(238.4)     Past Surgical History:  Procedure Laterality Date   BRONCHIAL WASHINGS  07/16/2019   Procedure: BRONCHIAL WASHINGS;  Surgeon: Rigoberto Noel, MD;  Location[facility] ENDOSCOPY;  Service: Cardiopulmonary;;   VIDEO BRONCHOSCOPY N/A 07/16/2019   Procedure: VIDEO BRONCHOSCOPY WITHOUT FLUORO;  Surgeon: Rigoberto Noel, MD;  Location: [facility];  Service: Cardiopulmonary;  Laterality: N/A;     No Known Allergies  Family History  Problem Relation Age of Onset   Colon cancer Neg Hx    Pancreatic cancer Neg Hx    Stomach cancer Neg Hx    Liver cancer Neg Hx    Esophageal cancer Neg Hx      Prior to Admission medications   Medication Sig Start Date End Date Taking? Authorizing Provider  amLODipine (NORVASC) 5 MG tablet TAKE 1 TABLET(5 MG) BY MOUTH DAILY 06/21/21  Yes Billie Ruddy, MD  Multiple Vitamins-Minerals (MULTIVITAMIN ADULTS 50+) TABS Take 1 tablet by mouth daily.   Yes [provider]  amoxicillin (AMOXIL) 500 MG tablet Take 1 tablet (500 mg total) by mouth 2 (two) times daily for 10 days. 06/28/21 07/08/21  Billie Ruddy, MD  Blood Pressure Monitoring (BLOOD PRESSURE CUFF) MISC Use daily as directed. 12/23/19   Billie Ruddy, MD  fexofenadine [facility] ALLERGY) 60  MG tablet Take 1 tablet (60 mg total) by mouth daily. Patient not taking: Reported on 07/02/2021 12/02/19   Billie Ruddy, MD  folic acid (FOLVITE) 1 MG tablet Take 1 tablet (1 mg total) by mouth daily. 06/16/21   Billie Ruddy, MD  Ipratropium-Albuterol (COMBIVENT) 20-100 MCG/ACT AERS respimat Inhale 1 puff into the lungs in the morning and at bedtime. Patient not taking: Reported on 07/02/2021 07/18/19   Carollee Leitz, MD  omeprazole (PRILOSEC) 20 MG capsule Take 1 capsule (20 mg total) by mouth daily. 06/26/21   Daryel November, MD  SYRINGE-NEEDLE, DISP, 3 ML (BD ECLIPSE SYRINGE) 25G X 5/8" 3 ML MISC Use as directed 12/23/19   Billie Ruddy, MD  umeclidinium bromide (INCRUSE ELLIPTA) 62.5 MCG/INH AEPB Inhale 1 puff into the lungs daily. Patient not taking: Reported on 07/02/2021 07/19/19   Carollee Leitz, MD  Vitamin D, Ergocalciferol, (DRISDOL) 1.25 MG (50000 UNIT) CAPS capsule Take 1 capsule (50,000 Units total) by mouth every 7 (seven) days. Patient not taking: Reported on 07/02/2021 12/03/19   Billie Ruddy, MD    Physical Exam: Vitals:   07/02/21 2030 07/02/21 2100 07/02/21 2130 07/02/21 2235  BP: 128/87 (!) 143/92 (!) 130/96 125/75  Pulse: (!) 110 (!) 106 (!) 114 (!) 113  Resp: 17 17 (!) 21 19  Temp:      TempSrc:      SpO2: 98% 94% 92% 97%  Weight:      Height:        Constitutional: NAD, calm, comfortable, thin nontoxic appearing elderly female laying flat in bed Vitals:   07/02/21 2030 07/02/21 2100 07/02/21 2130 07/02/21 2235  BP: 128/87 (!) 143/92 (!) 130/96 125/75  Pulse: (!) 110 (!) 106 (!) 114 (!) 113  Resp: 17 17 (!) 21 19  Temp:      TempSrc:      SpO2: 98% 94% 92% 97%  Weight:      Height:       Eyes: lids and conjunctivae normal ENMT: Mucous membranes are moist.  Neck: normal, supple Respiratory: clear to auscultation bilaterally, no wheezing, no crackles. Normal respiratory effort. No accessory muscle use.  Cardiovascular: Regular rate and rhythm, no murmurs / rubs / gallops. No extremity edema.   Abdomen: Soft, nondistended no tenderness,  Bowel sounds positive.  Musculoskeletal: no clubbing / cyanosis. No joint deformity upper and lower extremities.  Normal muscle tone.  Skin: no rashes, lesions, ulcers.  Neurologic: CN 2-12 grossly intact. Sensation intact.  Upper extremity drifts to the side when asked to lift upper extremities in forward extension.  Strength 3/5 in bilateral lower extremity Psychiatric: Alert and oriented to self and place but not time. Not able to  provide current history.  Labs on Admission: I have personally reviewed following labs and imaging studies  CBC: Recent Labs  Lab 07/02/21 1943 07/02/21 2019  WBC 8.4  --   NEUTROABS 4.1  --   HGB 13.9 15.6*  HCT 40.7 46.0  MCV 96.4  --   PLT 251  --    Basic Metabolic Panel: Recent Labs  Lab 07/02/21 1943 07/02/21 2019  NA 135 139  K 3.9 3.5  CL 101 101  CO2 27  --   GLUCOSE 103* 95  BUN 6* 4*  CREATININE 0.52 0.50  CALCIUM 8.1*  --    GFR: Estimated Creatinine Clearance: 51 mL/min (by C-G formula based on SCr of 0.5 mg/dL). Liver Function Tests: Recent Labs  Lab 06/26/21  1035 07/02/21 1943  AST 55* 66*  ALT 22 24  ALKPHOS 135* 122  BILITOT 1.9* 2.2*  PROT 7.4 6.7  ALBUMIN 2.8* 2.3*   Recent Labs  Lab 07/02/21 1943  LIPASE 21   No results for input(s): AMMONIA in the last 168 hours. Coagulation Profile: Recent Labs  Lab 06/26/21 1035  INR 1.2*   Cardiac Enzymes: No results for input(s): CKTOTAL, CKMB, CKMBINDEX, TROPONINI in the last 168 hours. BNP (last 3 results) Recent Labs    06/15/21 1409  PROBNP 74.0   HbA1C: No results for input(s): HGBA1C in the last 72 hours. CBG: No results for input(s): GLUCAP in the last 168 hours. Lipid Profile: No results for input(s): CHOL, HDL, LDLCALC, TRIG, CHOLHDL, LDLDIRECT in the last 72 hours. Thyroid Function Tests: No results for input(s): TSH, T4TOTAL, FREET4, T3FREE, THYROIDAB in the last 72 hours. Anemia Panel: No results for input(s): VITAMINB12, FOLATE, FERRITIN, TIBC, IRON, RETICCTPCT in the last 72 hours. Urine analysis:    Component Value Date/Time   COLORURINE YELLOW 06/17/2021 2117   APPEARANCEUR CLEAR 06/17/2021 2117   LABSPEC 1.015 06/17/2021 2117   PHURINE 8.5 (H) 06/17/2021 2117   GLUCOSEU 100 (A) 06/17/2021 2117   HGBUR NEGATIVE 06/17/2021 2117   BILIRUBINUR positive 06/28/2021 [place] 06/17/2021 2117   PROTEINUR Positive (A) 06/28/2021 1559   PROTEINUR  NEGATIVE 06/17/2021 2117   UROBILINOGEN 2.0 (A) 06/28/2021 1559   UROBILINOGEN 1.0 05/01/2010 2118   NITRITE negative 06/28/2021 1559   NITRITE NEGATIVE 06/17/2021 2117   LEUKOCYTESUR Moderate (2+) (A) 06/28/2021 1559   LEUKOCYTESUR TRACE (A) 06/17/2021 2117    Radiological Exams on Admission: CT HEAD WO CONTRAST (5MM)  Result Date: 07/02/2021 CLINICAL DATA:  Mental status change, unknown cause EXAM: CT HEAD WITHOUT CONTRAST TECHNIQUE: Contiguous axial images were obtained from the base of the skull through the vertex without intravenous contrast. RADIATION DOSE REDUCTION: This exam was performed according to the departmental dose-optimization program which includes automated exposure control, adjustment of the mA and/or kV according to patient size and/or use of iterative reconstruction technique. COMPARISON:  CT head 06/30/2021 FINDINGS: Brain: Patchy and confluent areas of decreased attenuation are noted throughout the deep and periventricular white matter of the cerebral hemispheres bilaterally, compatible with chronic microvascular ischemic disease. No evidence of large-territorial acute infarction. No parenchymal hemorrhage. No mass lesion. No extra-axial collection. No mass effect or midline shift. No hydrocephalus. Basilar cisterns are patent. Vascular: No hyperdense vessel. Atherosclerotic calcifications are present within the cavernous internal carotid arteries. Skull: No acute fracture or focal lesion. Old left lamina papyracea fracture. Sinuses/Orbits: Paranasal sinuses and mastoid air cells are clear. The orbits are unremarkable. Other: None. IMPRESSION: No acute intracranial abnormality. Electronically Signed   By: Iven Finn M.D.   On: 07/02/2021 22:38   CT ABDOMEN PELVIS W CONTRAST  Result Date: 07/02/2021 CLINICAL DATA:  Nausea and vomiting with abdominal pain EXAM: CT ABDOMEN AND PELVIS WITH CONTRAST TECHNIQUE: Multidetector CT imaging of the abdomen and pelvis was performed  using the standard protocol following bolus administration of intravenous contrast. RADIATION DOSE REDUCTION: This exam was performed according to the departmental dose-optimization program which includes automated exposure control, adjustment of the mA and/or kV according to patient size and/or use of iterative reconstruction technique. CONTRAST:  171m OMNIPAQUE IOHEXOL 300 MG/ML  SOLN COMPARISON:  07/14/2019 FINDINGS: Lower chest: No acute abnormality. Hepatobiliary: Fatty infiltration of the liver is noted. Gallbladder is within normal limits. Mild fullness of the common  bile duct is noted although stable in appearance from the prior exam. Pancreas: Unremarkable. No pancreatic ductal dilatation or surrounding inflammatory changes. Spleen: Normal in size without focal abnormality. Adrenals/Urinary Tract: Adrenal glands are within normal limits. Kidneys demonstrate a normal enhancement pattern bilaterally. No renal calculi or obstructive changes are seen. Delayed images demonstrate normal excretion of contrast. The bladder is well distended. Stomach/Bowel: The appendix is within normal limits. No obstructive or inflammatory changes of the colon are seen. Small bowel is within normal limits. Stomach is unremarkable. Vascular/Lymphatic: Aortic atherosclerosis. No enlarged abdominal or pelvic lymph nodes. Reproductive: Uterus and bilateral adnexa are unremarkable. Other: No abdominal wall hernia or abnormality. No abdominopelvic ascites. Musculoskeletal: No acute bony abnormality is noted. Degenerative changes of lumbar spine are seen. IMPRESSION: Fatty infiltration of the liver. Mildly prominent common bile duct but stable from the prior exam likely consistent with the patient's age. No acute abnormality noted. Electronically Signed   By: Inez Catalina M.D.   On: 07/02/2021 21:05   DG Chest Port 1 View  Result Date: 07/02/2021 CLINICAL DATA:  Shortness of breath. EXAM: PORTABLE CHEST 1 VIEW COMPARISON:  Chest  x-ray 05/05/2020. FINDINGS: The heart size and mediastinal contours are within normal limits. Both lungs are clear. The visualized skeletal structures are unremarkable. IMPRESSION: No active disease. Electronically Signed   By: Ronney Asters M.D.   On: 07/02/2021 20:14      Assessment/Plan  Ataxia and memory loss concerning for Wernicke encephalopathy syndrome in the setting of Alcohol abuse -high dose IV thiamine 571m TID. B1 level pending -MRI of the brain per neurology -Daily Folic  -place on CIWA protocol  -PT eval  Dysphagia -pt following [facility] outpatient for this and weight loss -Negative CT abdomen/pelvis for any malignancy  -obtain speech eval -EDP discussed with GI and they recommend just continuing with outpatient barium swallow study  Abnormal LFTs/Elevated total bilirubin AST of 66, normal ALT and alk phos.  Total bilirubin 2.2. -CT abdomen/pelvis showing fatty liver and chronic stable CBD dilation.  Levels also likely elevated due to chronic alcohol use  HTN  Continue amlodipine  DVT prophylaxis:.Lovenox Code Status: Full Family Communication: Plan discussed with patient at bedside  disposition Plan: Home with at least 2 midnight stays  Consults called: Neurology and GI -called by ED physician  Admission status: inpatient  Level of care: Telemetry  Status is: Inpatient  Remains inpatient appropriate because: Admit - It is my clinical opinion that admission to INPATIENT is reasonable and necessary because this patient will require at least 2 midnights in the hospital to treat this condition based on the medical complexity of the problems presented.  Given the aforementioned information, the predictability of an adverse outcome is felt to be significant.         COrene DesanctisDO [facility]   If 7PM-7AM, please contact night-coverage [email or web address]   07/02/2021, 11:04 PM

## 2021-07-02 NOTE — ED Notes (Signed)
Attempted to ambulate patient with walker. Patient unable to stand up at all. Patient helped up but her knees locked, unable to stand independently.

## 2021-07-02 NOTE — ED Provider Notes (Signed)
Natasha Holland   CSN: [id number] Arrival date & time: 07/02/21  1926     History  Chief Complaint  Patient presents with   Fatigue    Natasha Holland is a 71 y.o. female hx of hypertension, alcohol abuse, here presenting with dizziness and falling.  Patient has been followed up with GI.  Patient apparently had more than 10 pound weight loss over the last month or so.  Patient also has been continue to drink alcohol.  Patient was seen in the ED about 15 days ago and had a negative CT head.  Patient also has seen GI over the last week and had another normal CT head and also scheduled for CT abdomen pelvis and a barium swallow and possible EGD.  Over the past week or so, patient has been falling and over the last 2 to 3 days, patient has been unable to ambulate. Patient also has not been eating much.  Son called the ambulance.   The history is provided by the patient.      Home Medications Prior to Admission medications   Medication Sig Start Date End Date Taking? Authorizing Provider  amLODipine (NORVASC) 5 MG tablet TAKE 1 TABLET(5 MG) BY MOUTH DAILY 06/21/21  Yes Billie Ruddy, MD  Multiple Vitamins-Minerals (MULTIVITAMIN ADULTS 50+) TABS Take 1 tablet by mouth daily.   Yes [provider]  amoxicillin (AMOXIL) 500 MG tablet Take 1 tablet (500 mg total) by mouth 2 (two) times daily for 10 days. 06/28/21 07/08/21  Billie Ruddy, MD  Blood Pressure Monitoring (BLOOD PRESSURE CUFF) MISC Use daily as directed. 12/23/19   Billie Ruddy, MD  fexofenadine (ALLEGRA ALLERGY) 60 MG tablet Take 1 tablet (60 mg total) by mouth daily. Patient not taking: Reported on 07/02/2021 12/02/19   Billie Ruddy, MD  folic acid (FOLVITE) 1 MG tablet Take 1 tablet (1 mg total) by mouth daily. 06/16/21   Billie Ruddy, MD  Ipratropium-Albuterol (COMBIVENT) 20-100 MCG/ACT AERS respimat Inhale 1 puff into the lungs in the morning and at bedtime. Patient  not taking: Reported on 07/02/2021 07/18/19   Carollee Leitz, MD  omeprazole (PRILOSEC) 20 MG capsule Take 1 capsule (20 mg total) by mouth daily. 06/26/21   Daryel November, MD  SYRINGE-NEEDLE, DISP, 3 ML (BD ECLIPSE SYRINGE) 25G X 5/8" 3 ML MISC Use as directed 12/23/19   Billie Ruddy, MD  umeclidinium bromide (INCRUSE ELLIPTA) 62.5 MCG/INH AEPB Inhale 1 puff into the lungs daily. Patient not taking: Reported on 07/02/2021 07/19/19   Carollee Leitz, MD  Vitamin D, Ergocalciferol, (DRISDOL) 1.25 MG (50000 UNIT) CAPS capsule Take 1 capsule (50,000 Units total) by mouth every 7 (seven) days. Patient not taking: Reported on 07/02/2021 12/03/19   Billie Ruddy, MD      Allergies    Patient has no known allergies.    Review of Systems   Review of Systems  Constitutional:  Positive for fatigue and unexpected weight change.  Neurological:  Positive for dizziness and weakness.  All other systems reviewed and are negative.  Physical Exam Updated Vital Signs BP 125/75    Pulse (!) 113    Temp 98.5 F (36.9 C) (Oral)    Resp 19    Ht 5\' 2"  (1.575 m)    Wt 55 kg    SpO2 97%    BMI 22.18 kg/m  Physical Exam Vitals and nursing Holland reviewed.  Constitutional:  Comments: Chronically ill, confused  HENT:     Head: Normocephalic.     Nose: Nose normal.     Mouth/Throat:     Mouth: Mucous membranes are moist.  Eyes:     Extraocular Movements: Extraocular movements intact.     Pupils: Pupils are equal, round, and reactive to light.  Cardiovascular:     Rate and Rhythm: Regular rhythm. Tachycardia present.     Pulses: Normal pulses.     Heart sounds: Normal heart sounds.  Pulmonary:     Effort: Pulmonary effort is normal.     Breath sounds: Normal breath sounds.  Abdominal:     General: Abdomen is flat.     Palpations: Abdomen is soft.  Musculoskeletal:        General: Normal range of motion.     Cervical back: Normal range of motion and neck supple.  Skin:    General: Skin is warm.      Capillary Refill: Capillary refill takes less than 2 seconds.  Neurological:     Comments: Resting tremors, patient is very unsteady and unable to stand up.  Patient required 2 people assist just to stand up.   Psychiatric:        Mood and Affect: Mood normal.        Behavior: Behavior normal.    ED Results / Procedures / Treatments   Labs (all labs ordered are listed, but only abnormal results are displayed) Labs Reviewed  CBC WITH DIFFERENTIAL/PLATELET - Abnormal; Notable for the following components:      Result Value   RDW 18.9 (*)    Monocytes Absolute 1.1 (*)    Basophils Absolute 0.2 (*)    Abs Immature Granulocytes 0.09 (*)    All other components within normal limits  COMPREHENSIVE METABOLIC PANEL - Abnormal; Notable for the following components:   Glucose, Bld 103 (*)    BUN 6 (*)    Calcium 8.1 (*)    Albumin 2.3 (*)    AST 66 (*)    Total Bilirubin 2.2 (*)    All other components within normal limits  I-STAT CHEM 8, ED - Abnormal; Notable for the following components:   BUN 4 (*)    Calcium, Ion 0.97 (*)    Hemoglobin 15.6 (*)    All other components within normal limits  RESP PANEL BY RT-PCR (FLU A&B, COVID) ARPGX2  LIPASE, BLOOD  ETHANOL  URINALYSIS, ROUTINE W REFLEX MICROSCOPIC  VITAMIN B1    EKG EKG Interpretation  Date/Time:  Sunday July 02 2021 19:56:49 EST Ventricular Rate:  109 PR Interval:  153 QRS Duration: 44 QT Interval:  372 QTC Calculation: 501 R Axis:   44 Text Interpretation: Sinus tachycardia Probable left atrial enlargement Low voltage, extremity leads Abnormal R-wave progression, early transition Nonspecific repol abnormality, diffuse leads Prolonged QT interval No significant change since last tracing Confirmed by Wandra Arthurs [phone]) on 07/02/2021 8:04:35 PM  Radiology CT HEAD WO CONTRAST (5MM)  Result Date: 07/02/2021 CLINICAL DATA:  Mental status change, unknown cause EXAM: CT HEAD WITHOUT CONTRAST TECHNIQUE: Contiguous  axial images were obtained from the base of the skull through the vertex without intravenous contrast. RADIATION DOSE REDUCTION: This exam was performed according to the departmental dose-optimization program which includes automated exposure control, adjustment of the mA and/or kV according to patient size and/or use of iterative reconstruction technique. COMPARISON:  CT head 06/30/2021 FINDINGS: Brain: Patchy and confluent areas of decreased attenuation are noted throughout the deep  and periventricular white matter of the cerebral hemispheres bilaterally, compatible with chronic microvascular ischemic disease. No evidence of large-territorial acute infarction. No parenchymal hemorrhage. No mass lesion. No extra-axial collection. No mass effect or midline shift. No hydrocephalus. Basilar cisterns are patent. Vascular: No hyperdense vessel. Atherosclerotic calcifications are present within the cavernous internal carotid arteries. Skull: No acute fracture or focal lesion. Old left lamina papyracea fracture. Sinuses/Orbits: Paranasal sinuses and mastoid air cells are clear. The orbits are unremarkable. Other: None. IMPRESSION: No acute intracranial abnormality. Electronically Signed   By: Iven Finn M.D.   On: 07/02/2021 22:38   CT ABDOMEN PELVIS W CONTRAST  Result Date: 07/02/2021 CLINICAL DATA:  Nausea and vomiting with abdominal pain EXAM: CT ABDOMEN AND PELVIS WITH CONTRAST TECHNIQUE: Multidetector CT imaging of the abdomen and pelvis was performed using the standard protocol following bolus administration of intravenous contrast. RADIATION DOSE REDUCTION: This exam was performed according to the departmental dose-optimization program which includes automated exposure control, adjustment of the mA and/or kV according to patient size and/or use of iterative reconstruction technique. CONTRAST:  12mL OMNIPAQUE IOHEXOL 300 MG/ML  SOLN COMPARISON:  07/14/2019 FINDINGS: Lower chest: No acute abnormality.  Hepatobiliary: Fatty infiltration of the liver is noted. Gallbladder is within normal limits. Mild fullness of the common bile duct is noted although stable in appearance from the prior exam. Pancreas: Unremarkable. No pancreatic ductal dilatation or surrounding inflammatory changes. Spleen: Normal in size without focal abnormality. Adrenals/Urinary Tract: Adrenal glands are within normal limits. Kidneys demonstrate a normal enhancement pattern bilaterally. No renal calculi or obstructive changes are seen. Delayed images demonstrate normal excretion of contrast. The bladder is well distended. Stomach/Bowel: The appendix is within normal limits. No obstructive or inflammatory changes of the colon are seen. Small bowel is within normal limits. Stomach is unremarkable. Vascular/Lymphatic: Aortic atherosclerosis. No enlarged abdominal or pelvic lymph nodes. Reproductive: Uterus and bilateral adnexa are unremarkable. Other: No abdominal wall hernia or abnormality. No abdominopelvic ascites. Musculoskeletal: No acute bony abnormality is noted. Degenerative changes of lumbar spine are seen. IMPRESSION: Fatty infiltration of the liver. Mildly prominent common bile duct but stable from the prior exam likely consistent with the patient's age. No acute abnormality noted. Electronically Signed   By: Inez Catalina M.D.   On: 07/02/2021 21:05   DG Chest Port 1 View  Result Date: 07/02/2021 CLINICAL DATA:  Shortness of breath. EXAM: PORTABLE CHEST 1 VIEW COMPARISON:  Chest x-ray 05/05/2020. FINDINGS: The heart size and mediastinal contours are within normal limits. Both lungs are clear. The visualized skeletal structures are unremarkable. IMPRESSION: No active disease. Electronically Signed   By: Ronney Asters M.D.   On: 07/02/2021 20:14    Procedures Procedures    Medications Ordered in ED Medications  thiamine (B-1) injection 100 mg (has no administration in time range)  iohexol (OMNIPAQUE) 300 MG/ML solution 100 mL  (100 mLs Intravenous Contrast Given 07/02/21 2040)    ED Course/ Medical Decision Making/ A&P                           Medical Decision Making Alyxandria Wentz is a 71 y.o. female here presenting with poor appetite and also weakness and dizziness.  Patient has been falling at home.  We will get a CT head to rule out a bleed.  Patient also has been having trouble swallowing and GI ordered an outpatient CT abdomen pelvis and swallow eval.  We will get labs and  CT abdomen pelvis to rule out a mass.  We will also hydrate patient and consult GI.  10:30 PM Discussed case with Dr. Lyndel Safe from GI.  He states that the CT abdomen pelvis is unremarkable.  He states that the barium swallow is scheduled outpatient.  I talked to the son.  He states that patient is unable to even walk and was very concerned that she will fall again.  I am concerned that maybe she has Warnicke Korsakoff.  I discussed case with Dr. Leonel Ramsay from neurology.  He recommend IV thiamine 500 mg 3 times daily.  He recommend thiamine level prior to that.  Also recommend MRI brain.  Patient may need to be started on CIWA.  Patient is tachycardic currently.  Hospitalist to admit.    Problems Addressed: Weakness: acute illness or injury Weight loss: acute illness or injury Wernicke encephalopathy: acute illness or injury  Amount and/or Complexity of Data Reviewed Labs: ordered. Decision-making details documented in ED Course. Radiology: ordered and independent interpretation performed. Decision-making details documented in ED Course. ECG/medicine tests: ordered and independent interpretation performed. Decision-making details documented in ED Course.  Risk Prescription drug management. Decision regarding hospitalization.  Final Clinical Impression(s) / ED Diagnoses Final diagnoses:  Weakness  Wernicke encephalopathy  Weight loss    Rx / DC Orders ED Discharge Orders     None         Natasha Freeze, MD 07/02/21  2304

## 2021-07-03 ENCOUNTER — Inpatient Hospital Stay (HOSPITAL_COMMUNITY): Payer: Medicare Other

## 2021-07-03 ENCOUNTER — Ambulatory Visit (HOSPITAL_COMMUNITY): Admission: RE | Admit: 2021-07-03 | Payer: Medicare Other | Source: Ambulatory Visit

## 2021-07-03 DIAGNOSIS — R634 Abnormal weight loss: Secondary | ICD-10-CM

## 2021-07-03 LAB — RESP PANEL BY RT-PCR (FLU A&B, COVID) ARPGX2
Influenza A by PCR: NEGATIVE
Influenza B by PCR: NEGATIVE
SARS Coronavirus 2 by RT PCR: NEGATIVE

## 2021-07-03 LAB — COMPREHENSIVE METABOLIC PANEL
ALT: 23 U/L (ref 0–44)
AST: 56 U/L — ABNORMAL HIGH (ref 15–41)
Albumin: 2.1 g/dL — ABNORMAL LOW (ref 3.5–5.0)
Alkaline Phosphatase: 102 U/L (ref 38–126)
Anion gap: 8 (ref 5–15)
BUN: 5 mg/dL — ABNORMAL LOW (ref 8–23)
CO2: 26 mmol/L (ref 22–32)
Calcium: 8 mg/dL — ABNORMAL LOW (ref 8.9–10.3)
Chloride: 101 mmol/L (ref 98–111)
Creatinine, Ser: 0.54 mg/dL (ref 0.44–1.00)
GFR, Estimated: >60 mL/min (ref >60)
Glucose, Bld: 94 mg/dL (ref 70–99)
Potassium: 3.1 mmol/L — ABNORMAL LOW (ref 3.5–5.1)
Sodium: 135 mmol/L (ref 135–145)
Total Bilirubin: 2.1 mg/dL — ABNORMAL HIGH (ref 0.3–1.2)
Total Protein: 6 g/dL — ABNORMAL LOW (ref 6.5–8.1)

## 2021-07-03 LAB — MAGNESIUM: Magnesium: 1.5 mg/dL — ABNORMAL LOW (ref 1.7–2.4)

## 2021-07-03 IMAGING — MR MR HEAD W/O CM
11 series · 48 of 48 positions shown · non-contrast
Comparison: No prior MRI, correlation is made with CT head
[DATE]

CLINICAL DATA: Ataxia

EXAM:
MRI HEAD WITHOUT CONTRAST
TECHNIQUE: Multiplanar, multiecho pulse sequences of the brain and surrounding
structures were obtained without intravenous contrast.

[Series 5: DWI · axial · 3.0mm · 1.36mm/px · z∈[-56,+84]mm · 9 of 95 slices shown (1 of 2)]
[im 1/95]
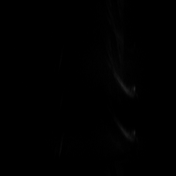
[im 12/95]
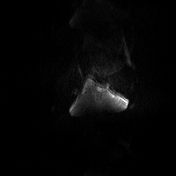
[im 24/95]
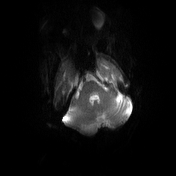
[im 36/95]
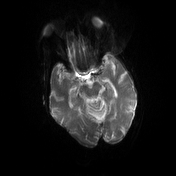
[im 48/95]
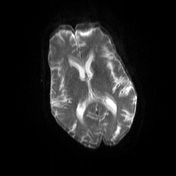
[im 59/95]
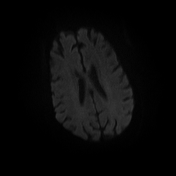
[im 71/95]
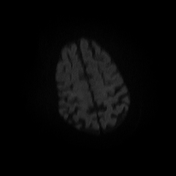
[im 83/95]
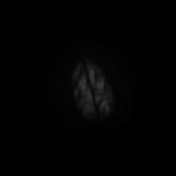
[im 95/95]
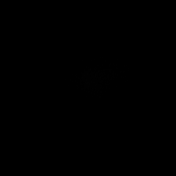

[Series 6: DWI · axial · 3.0mm · 1.36mm/px · z∈[-56,+75]mm · 4 of 45 slices shown (2 of 2)]
[im 1/45]
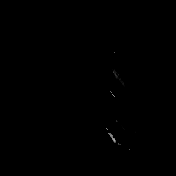
[im 15/45]
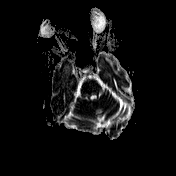
[im 30/45]
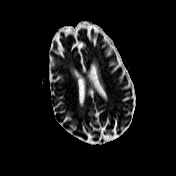
[im 45/45]
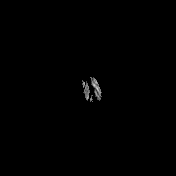

[Series 7: T1 · sagittal · 5.0mm · 0.75mm/px · 2 of 23 slices shown (1 of 3)]
[im 1/23]
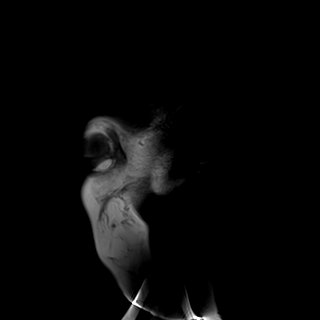
[im 23/23]
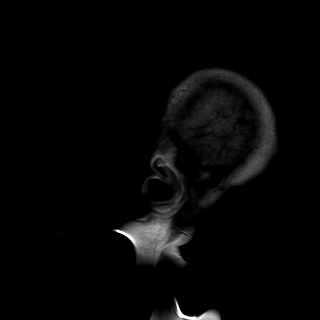

[Series 8: T2 · axial · 5.0mm · 0.62mm/px · z∈[-57,+85]mm · 2 of 23 slices shown (1 of 2)]
[im 1/23]
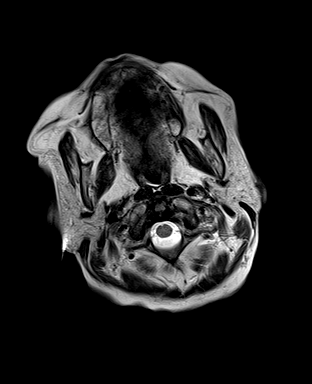
[im 23/23]
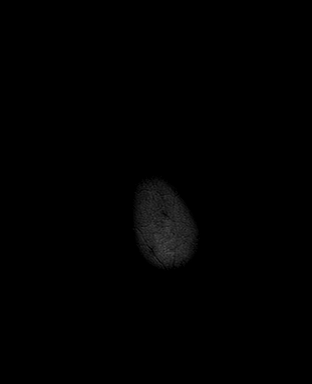

[Series 9: swi_images · axial · 3.0mm · 0.75mm/px · z∈[-56,+84]mm · 4 of 48 slices shown]
[im 1/48]
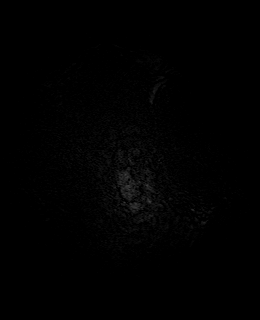
[im 16/48]
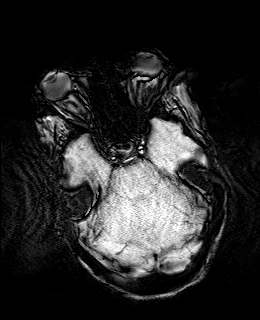
[im 32/48]
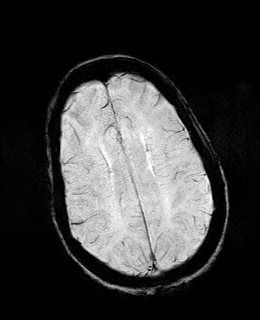
[im 48/48]
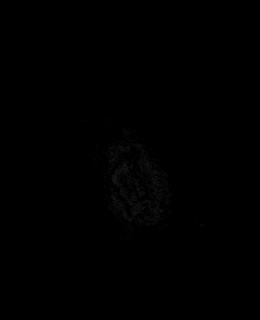

[Series 11: FLAIR · axial · 3.0mm · 0.75mm/px · z∈[-54,+83]mm · 4 of 47 slices shown]
[im 1/47]
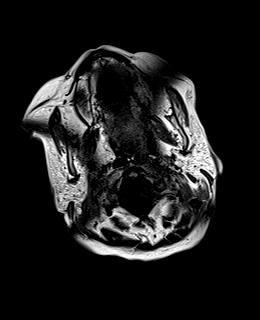
[im 16/47]
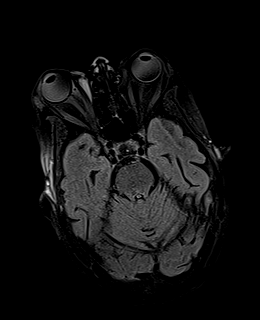
[im 31/47]
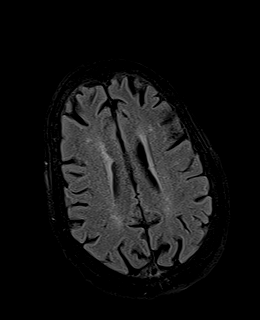
[im 47/47]
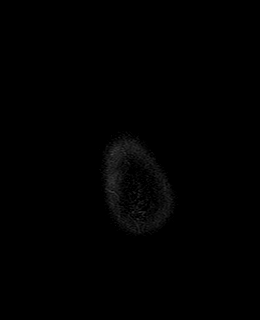

[Series 12: T1 · axial · 1.0mm · 0.94mm/px · z∈[-56,+85]mm · 12 of 144 slices shown (2 of 3)]
[im 1/144]
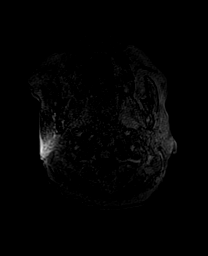
[im 14/144]
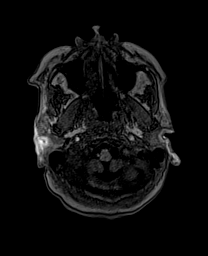
[im 27/144]
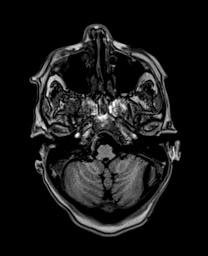
[im 40/144]
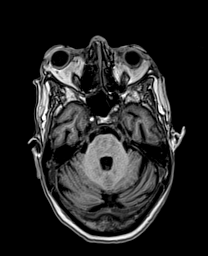
[im 53/144]
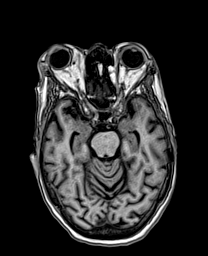
[im 66/144]
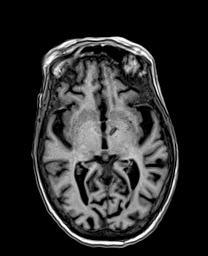
[im 79/144]
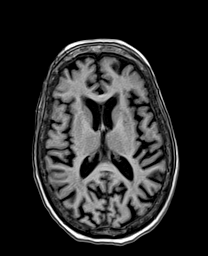
[im 92/144]
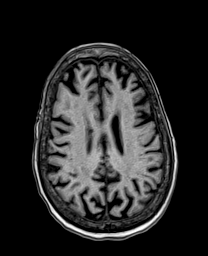
[im 105/144]
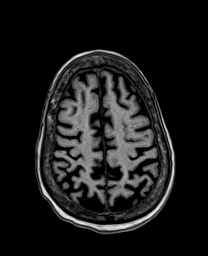
[im 118/144]
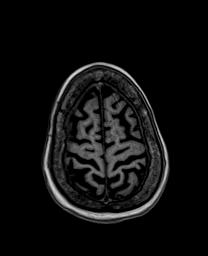
[im 131/144]
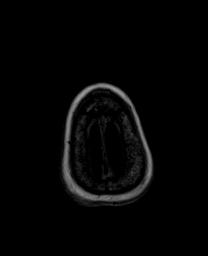
[im 144/144]
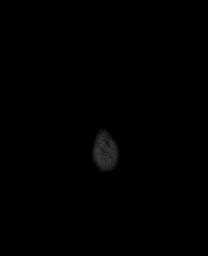

[Series 13: cor dwi_tracew · coronal · 5.0mm · 1.53mm/px · 5 of 58 slices shown]
[im 1/58]
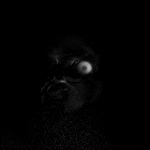
[im 15/58]
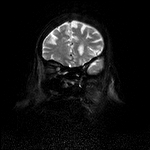
[im 29/58]
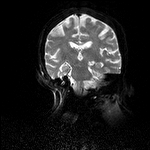
[im 43/58]
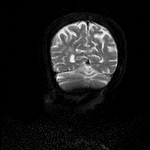
[im 58/58]
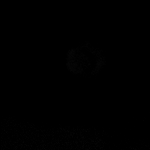

[Series 14: cor dwi_adc · coronal · 5.0mm · 1.53mm/px · 2 of 27 slices shown]
[im 1/27]
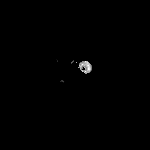
[im 27/27]
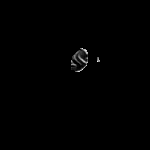

[Series 15: T2 · coronal · 5.0mm · 0.57mm/px · 2 of 29 slices shown (2 of 2)]
[im 1/29]
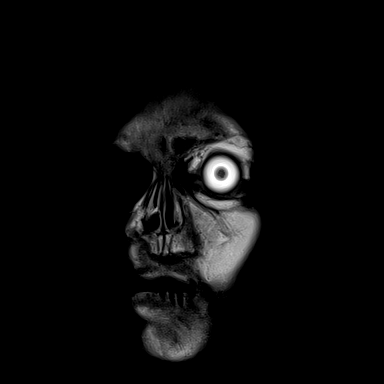
[im 29/29]
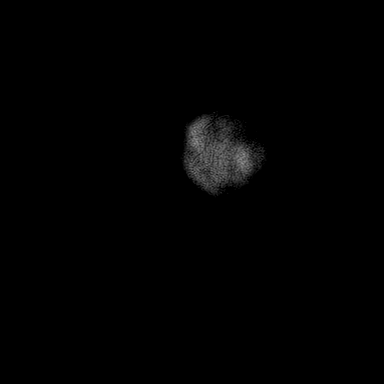

[Series 16: T1 · sagittal · 5.0mm · 0.94mm/px · 2 of 23 slices shown (3 of 3)]
[im 1/23]
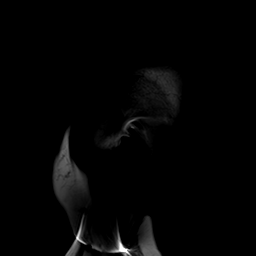
[im 23/23]
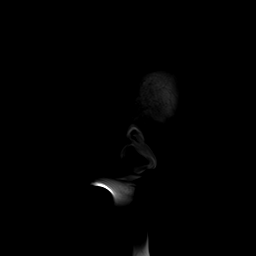

[48 of 48 positions shown; findings below may reference images not displayed]

FINDINGS: Evaluation is limited by motion artifact.

Brain: No restricted diffusion to suggest acute or subacute infarct.
No acute hemorrhage, mass, mass effect, or midline shift. No foci of
hemosiderin deposition to suggest remote hemorrhage. No
hydrocephalus or extra-axial collection. Mild T2 hyperintense signal
in the periventricular white matter, likely the sequela of chronic
small vessel ischemic disease. Mildly increased T2 hyperintense
signal in the mamillary bodies bilaterally (series 11, image 21); no
increased T2 hyperintense signal along the third ventricle, in the
dorsomedial thalami, or in the periaqueductal region.

Vascular: Normal flow voids.

Skull and upper cervical spine: Normal marrow signal.

Sinuses/Orbits: Mild mucosal thickening in the right maxillary sinus
and right posterior ethmoid air cells. Right-greater-than-left
exophthalmos. Remote left medial orbital fracture.

Other: None.
IMPRESSION: Mildly increased T2 hyperintense signal in the mamillary bodies
bilaterally, as can be seen in the setting of Wernicke
encephalopathy; however, no abnormal signal is seen in the other
areas that can be typically involved. No other acute intracranial
process.

## 2021-07-03 MED ORDER — POTASSIUM CHLORIDE CRYS ER 20 MEQ PO TBCR
40.0000 meq | EXTENDED_RELEASE_TABLET | ORAL | Status: AC
  Administered 2021-07-03 (×2): 40 meq via ORAL
  Filled 2021-07-03 (×2): qty 2

## 2021-07-03 MED ORDER — ENSURE ENLIVE PO LIQD
237.0000 mL | Freq: Two times a day (BID) | ORAL | Status: DC
  Administered 2021-07-03 – 2021-07-10 (×15): 237 mL via ORAL

## 2021-07-03 MED ORDER — MAGNESIUM SULFATE 2 GM/50ML IV SOLN
2.0000 g | Freq: Once | INTRAVENOUS | Status: AC
  Administered 2021-07-03: 2 g via INTRAVENOUS
  Filled 2021-07-03: qty 50

## 2021-07-03 NOTE — Plan of Care (Signed)

## 2021-07-03 NOTE — Progress Notes (Signed)
Initial Nutrition Assessment  DOCUMENTATION CODES:   Non-severe (moderate) malnutrition in context of acute illness/injury  INTERVENTION:   Monitor magnesium, potassium, and phosphorus BID for at least 3 days, MD to replete as needed, as pt is at risk for refeeding syndrome.  -Ensure Plus High Protein po BID, each supplement provides 350 kcal and 20 grams of protein.  NUTRITION DIAGNOSIS:   Moderate Malnutrition related to acute illness, dysphagia, lethargy/confusion as evidenced by energy intake < 75% for > 7 days, moderate fat depletion, moderate muscle depletion.  GOAL:   Patient will meet greater than or equal to 90% of their needs  MONITOR:   PO intake, Supplement acceptance, Labs, Weight trends, I & O's  REASON FOR ASSESSMENT:   Malnutrition Screening Tool    ASSESSMENT:   71 y.o. female with medical history significant for hypertension, alcohol use who presents with concerns of weakness and recurrent falls.  Patient in room, calm and able to answer RD's questions. Pt reports she eats well with good appetite. Pt was being followed by GI for dysphagia but she currently denies any swallowing issues now. Denies chewing issues as well.  H/o alcohol abuse, drinking 2-3 drinks daily.  Per family reports in chart review, pt has not been eating well over the past month.  Pt had no breakfast this morning. Lunch tray was ordered. Requested RD order her some cookies and coffee.   SLP evaluated today, recommended dysphagia 3 diet. Had some difficulty feeding herself.   Per weight records, pt has lost 8 lbs since 1/12. Family reported 10 lbs over the past month. Pt states her UBW is between 110-120 lbs.  Medications: folic acid, Multivitamin with minerals daily, KLOR-CON, IV Mg sulfate, IV Thiamine  Labs reviewed:  Low K, Mg  NUTRITION - FOCUSED PHYSICAL EXAM:  Flowsheet Row Most Recent Value  Orbital Region Moderate depletion  Upper Arm Region Mild depletion  Thoracic  and Lumbar Region Mild depletion  Buccal Region Moderate depletion  Temple Region Moderate depletion  Clavicle Bone Region Moderate depletion  Clavicle and Acromion Bone Region Moderate depletion  Scapular Bone Region Moderate depletion  Dorsal Hand Moderate depletion  Patellar Region Moderate depletion  Anterior Thigh Region Moderate depletion  Posterior Calf Region Moderate depletion  Edema (RD Assessment) None  Hair Unable to assess  [covered]  Eyes Reviewed  [yellow sclera]  Mouth Reviewed  [poor dentition]  Skin Reviewed  [dry, flaky]  Nails Unable to assess  [fake nails]       Diet Order:   Diet Order             Diet regular Room service appropriate? Yes; Fluid consistency: Thin  Diet effective now                   EDUCATION NEEDS:   No education needs have been identified at this time  Skin:  Skin Assessment: Reviewed RN Assessment  Last BM:  1/29  Height:   Ht Readings from Last 1 Encounters:  07/02/21 5\' 2"  (1.575 m)    Weight:   Wt Readings from Last 1 Encounters:  07/02/21 55 kg    BMI:  Body mass index is 22.18 kg/m.  Estimated Nutritional Needs:   Kcal:  1650-1850  Protein:  80-90g  Fluid:  1.8L/day  Clayton Bibles, MS, RD, LDN Inpatient Clinical Dietitian Contact information available via Amion

## 2021-07-03 NOTE — Plan of Care (Signed)
Discussed with patient plan of care for the evening, pain management and admission questions with some teach back displayed.  Primary RN checking with son about pocket book patient is asking for.  Problem: Education: Goal: Knowledge of General Education information will improve Description: Including pain rating scale, medication(s)/side effects and non-pharmacologic comfort measures Outcome: Progressing   Problem: Pain Managment: Goal: General experience of comfort will improve Outcome: Progressing

## 2021-07-03 NOTE — Consult Note (Signed)
Neurology Consult H&P  Natasha Holland MR# [id number] 07/03/2021   CC: weakness and recurrent falls  History is obtained from: chart as patient is somnolent and was not amenable to history taking or exam..  HPI: Natasha Holland is a 71 y.o. female PMHx as reviewed below history of chronic alcohol abuse admitted with ataxia memory loss weakness and confusion.    The following information was taken from Dr. Rodena Piety note 07/03/2021:   "71 year old female with history of alcohol abuse admitted with weakness and recurrent falls. CT head negative on 06/30/2021. She was seen by GI on 06/26/2021 as an outpatient and was scheduled to have a CT abdomen and pelvis and possible barium swallow and endoscopy. ED physician discussed with Dr. Lyndel Safe from GI and had no further recommendations during this hospital stay. ED physician also discussed with neurology Dr. Leonel Ramsay had concerns for Wernicke's Korsakoff syndrome and was advised to start the patient on high-dose thiamine.  He also recommended an MRI."   ROS: Unable to assess due to encephalopathy.  Past Medical History:  Diagnosis Date   Hypertension    Polycythemia vera(238.4)      Family History  Problem Relation Age of Onset   Colon cancer Neg Hx    Pancreatic cancer Neg Hx    Stomach cancer Neg Hx    Liver cancer Neg Hx    Esophageal cancer Neg Hx     Social History:  reports that she has quit smoking. Her smoking use included cigarettes. She smoked an average of 1 pack per day. She has never used smokeless tobacco. She reports current alcohol use of about 7.0 standard drinks per week. She reports that she does not use drugs.   Prior to Admission medications   Medication Sig Start Date End Date Taking? Authorizing Provider  amLODipine (NORVASC) 5 MG tablet TAKE 1 TABLET(5 MG) BY MOUTH DAILY 06/21/21  Yes Billie Ruddy, MD  Multiple Vitamins-Minerals (MULTIVITAMIN ADULTS 50+) TABS Take 1 tablet by mouth daily.   Yes [provider]  amoxicillin (AMOXIL) 500 MG tablet Take 1 tablet (500 mg total) by mouth 2 (two) times daily for 10 days. 06/28/21 07/08/21  Billie Ruddy, MD  Blood Pressure Monitoring (BLOOD PRESSURE CUFF) MISC Use daily as directed. 12/23/19   Billie Ruddy, MD  fexofenadine (ALLEGRA ALLERGY) 60 MG tablet Take 1 tablet (60 mg total) by mouth daily. Patient not taking: Reported on 07/02/2021 12/02/19   Billie Ruddy, MD  folic acid (FOLVITE) 1 MG tablet Take 1 tablet (1 mg total) by mouth daily. 06/16/21   Billie Ruddy, MD  Ipratropium-Albuterol (COMBIVENT) 20-100 MCG/ACT AERS respimat Inhale 1 puff into the lungs in the morning and at bedtime. Patient not taking: Reported on 07/02/2021 07/18/19   Carollee Leitz, MD  omeprazole (PRILOSEC) 20 MG capsule Take 1 capsule (20 mg total) by mouth daily. 06/26/21   Daryel November, MD  SYRINGE-NEEDLE, DISP, 3 ML (BD ECLIPSE SYRINGE) 25G X 5/8" 3 ML MISC Use as directed 12/23/19   Billie Ruddy, MD  umeclidinium bromide (INCRUSE ELLIPTA) 62.5 MCG/INH AEPB Inhale 1 puff into the lungs daily. Patient not taking: Reported on 07/02/2021 07/19/19   Carollee Leitz, MD  Vitamin D, Ergocalciferol, (DRISDOL) 1.25 MG (50000 UNIT) CAPS capsule Take 1 capsule (50,000 Units total) by mouth every 7 (seven) days. Patient not taking: Reported on 07/02/2021 12/03/19   Billie Ruddy, MD    Exam: Current vital signs: BP 132/82 (BP Location:  Right Arm)    Pulse (!) 105    Temp 98.3 F (36.8 C) (Oral)    Resp 20    Ht 5\' 2"  (1.575 m)    Wt 55 kg    SpO2 95%    BMI 22.18 kg/m   Physical Exam  Constitutional: Appears well-developed and well-nourished.  Psych: Affect appropriate to situation Eyes: No scleral injection HENT: No OP obstruction. Head: Normocephalic.  Cardiovascular: Normal rate and regular rhythm.  Respiratory: Effort normal, symmetric excursions bilaterally, no audible wheezing. GI: Soft.  No distension. There is no tenderness.  Skin:  WDI  Neuro: Mental Status: Patient is sleeping and only say what and yes to noxious stimulus. Patient is not able to give a clear and coherent history. Speech nonverbal  Visual Fields not able to test. Resists forced eye opening Eyes: right exotropia Facial symmetric.  Hearing is intact to voice. Tone is normal. Bulk is normal.  Localizes to mild noxious stimulus in all extremities. Sensation is symmetric to cold temperature in the arms and legs. Deep Tendon Reflexes: Brisk throughout. Toes are downgoing bilaterally. FNF and HKS unable to test. Gait - Deferred  I have reviewed labs in epic and the pertinent results are: K 3.1  I have reviewed the images obtained: MRI brain showed increased T2 signal in the mamillary bodies bilaterally and periaqueductal grey matter.  Assessment: Natasha Holland is a 71 y.o. female PMHx history of chronic alcohol abuse admitted with ataxia memory loss weakness and confusion and MRI showing high T2 signal in bilateral mamillary bodies and periaqueductal grey matter. Symptoms and MRI brain are concordant and highly suggestive of Wernicke encephalopathy.  Impression:  Wernicke Encephalopathy Chronic alcohol abuse Malnutrition  Plan: - Continue supplementation will likely need indefinitely. - Monitor and replete K, Mg, potassium as needed.  - No further recommendations. - Neurology will remain available, please call for questions.   Electronically signed by:  Lynnae Sandhoff, MD Page: [phone] 07/03/2021, 8:23 PM  If 7pm- 7am, please page neurology on call as listed in [place].

## 2021-07-03 NOTE — Progress Notes (Signed)
PROGRESS NOTE    Natasha Holland  OEU:[id number] DOB: May 10, 1951 DOA: 07/02/2021 PCP: Billie Ruddy, MD   Brief Narrative: 71 year old female with history of alcohol abuse admitted with weakness and recurrent falls. CT head negative on 06/30/2021. She was seen by GI on 06/26/2021 as an outpatient and was scheduled to have a CT abdomen and pelvis and possible barium swallow and endoscopy. ED physician discussed with Dr. Lyndel Safe from GI and had no further recommendations during this hospital stay. ED physician also discussed with neurology Dr. Leonel Ramsay had concerns for Wernicke's Korsakoff syndrome and was advised to start the patient on high-dose thiamine.  He also recommended an MRI.   Assessment & Plan:   Principal Problem:   Wernicke encephalopathy syndrome Active Problems:   Alcohol use   Essential hypertension   Dysphagia   #1 possible Wernicke's encephalopathy patient with history of chronic alcohol abuse admitted with ataxia memory loss weakness and confusion.  Patient lives at home with her son. Patient has been started on high-dose thiamine and MRI ordered per neurology recommendations. Continue CIWA protocol. PT eval pending.  #2 abnormal LFTs likely due to alcohol use.  CT of the abdomen and pelvis shows fatty liver and chronic stable CBD dilatation.  #3 hypertension on amlodipine  #4 dysphagia chronic followed by low-power GI planning for barium swallow as an outpatient. CT of the abdomen and pelvis Speech eval pending  #5 hypokalemia -k 3.1 replete  Mag 1.5 replete Recheck levels in am   Estimated body mass index is 22.18 kg/m as calculated from the following:   Height as of this encounter: 5\' 2"  (1.575 m).   Weight as of this encounter: 55 kg.  DVT prophylaxis: lovenox Code Status: full Family Communication: none Disposition Plan:  Status is: Inpatient  Consultants: none  Procedures: none Antimicrobials none  Subjective: Awake  Oriented to  hospital Does not know who the president is  Objective: Vitals:   07/02/21 2235 07/03/21 0001 07/03/21 0206 07/03/21 0423  BP: 125/75 (!) 167/109 136/81 129/80  Pulse: (!) 113 (!) 113 (!) 109 (!) 110  Resp: 19 18 18 18   Temp:  98.5 F (36.9 C) 98.7 F (37.1 C) 98.4 F (36.9 C)  TempSrc:  Oral Oral Oral  SpO2: 97% 98% 99% 93%  Weight:      Height:        Intake/Output Summary (Last 24 hours) at 07/03/2021 1240 Last data filed at 07/03/2021 0500 Gross per 24 hour  Intake 410 ml  Output --  Net 410 ml   Filed Weights   07/02/21 1933  Weight: 55 kg    Examination:no asterexis  General exam: Appears calm and comfortable  Chronically ill looking elderly female  Respiratory system: Clear to auscultation. Respiratory effort normal. Cardiovascular system: S1 & S2 heard, RRR. No JVD, murmurs, rubs, gallops or clicks. No pedal edema. Gastrointestinal system: Abdomen is nondistended, soft and nontender. No organomegaly or masses felt. Normal bowel sounds heard. Central nervous system: Alert and oriented. No focal neurological deficits. Extremities: Symmetric 5 x 5 power. Skin: No rashes, lesions or ulcers Psychiatry: Judgement and insight appear normal. Mood & affect appropriate.     Data Reviewed: I have personally reviewed following labs and imaging studies  CBC: Recent Labs  Lab 07/02/21 1943 07/02/21 2019  WBC 8.4  --   NEUTROABS 4.1  --   HGB 13.9 15.6*  HCT 40.7 46.0  MCV 96.4  --   PLT 251  --  Basic Metabolic Panel: Recent Labs  Lab 07/02/21 1943 07/02/21 2019 07/03/21 0500  NA 135 139 135  K 3.9 3.5 3.1*  CL 101 101 101  CO2 27  --  26  GLUCOSE 103* 95 94  BUN 6* 4* 5*  CREATININE 0.52 0.50 0.54  CALCIUM 8.1*  --  8.0*  MG  --   --  1.5*   GFR: Estimated Creatinine Clearance: 51 mL/min (by C-G formula based on SCr of 0.54 mg/dL). Liver Function Tests: Recent Labs  Lab 07/02/21 1943 07/03/21 0500  AST 66* 56*  ALT 24 23  ALKPHOS 122 102   BILITOT 2.2* 2.1*  PROT 6.7 6.0*  ALBUMIN 2.3* 2.1*   Recent Labs  Lab 07/02/21 1943  LIPASE 21   No results for input(s): AMMONIA in the last 168 hours. Coagulation Profile: No results for input(s): INR, PROTIME in the last 168 hours. Cardiac Enzymes: No results for input(s): CKTOTAL, CKMB, CKMBINDEX, TROPONINI in the last 168 hours. BNP (last 3 results) Recent Labs    06/15/21 1409  PROBNP 74.0   HbA1C: No results for input(s): HGBA1C in the last 72 hours. CBG: No results for input(s): GLUCAP in the last 168 hours. Lipid Profile: No results for input(s): CHOL, HDL, LDLCALC, TRIG, CHOLHDL, LDLDIRECT in the last 72 hours. Thyroid Function Tests: No results for input(s): TSH, T4TOTAL, FREET4, T3FREE, THYROIDAB in the last 72 hours. Anemia Panel: No results for input(s): VITAMINB12, FOLATE, FERRITIN, TIBC, IRON, RETICCTPCT in the last 72 hours. Sepsis Labs: No results for input(s): PROCALCITON, LATICACIDVEN in the last 168 hours.  Recent Results (from the past 240 hour(s))  Urine Culture     Status: None   Collection Time: 06/28/21  4:02 PM   Specimen: Urine  Result Value Ref Range Status   MICRO NUMBER: [id number]  Final   SPECIMEN QUALITY: Adequate  Final   Sample Source NOT GIVEN  Final   STATUS: FINAL  Final   ISOLATE 1:   Final    Mixed genital flora isolated. These superficial bacteria are not indicative of a urinary tract infection. No further organism identification is warranted on this specimen. If clinically indicated, recollect clean-catch, mid-stream urine and transfer  immediately to Urine Culture Transport Tube.   Resp Panel by RT-PCR (Flu A&B, Covid) Nasopharyngeal Swab     Status: None   Collection Time: 07/02/21 10:50 PM   Specimen: Nasopharyngeal Swab; Nasopharyngeal(NP) swabs in vial transport medium  Result Value Ref Range Status   SARS Coronavirus 2 by RT PCR NEGATIVE NEGATIVE Final    Comment: (NOTE) SARS-CoV-2 target nucleic acids are NOT  DETECTED.  The SARS-CoV-2 RNA is generally detectable in upper respiratory specimens during the acute phase of infection. The lowest concentration of SARS-CoV-2 viral copies this assay can detect is 138 copies/mL. A negative result does not preclude SARS-Cov-2 infection and should not be used as the sole basis for treatment or other patient management decisions. A negative result may occur with  improper specimen collection/handling, submission of specimen other than nasopharyngeal swab, presence of viral mutation(s) within the areas targeted by this assay, and inadequate number of viral copies(<138 copies/mL). A negative result must be combined with clinical observations, patient history, and epidemiological information. The expected result is Negative.  Fact Sheet for Patients:  EntrepreneurPulse.com.au  Fact Sheet for Healthcare Providers:  IncredibleEmployment.be  This test is no t yet approved or cleared by the [place] FDA and  has been authorized for detection and/or diagnosis of SARS-CoV-2  by FDA under an Emergency Use Authorization (EUA). This EUA will remain  in effect (meaning this test can be used) for the duration of the COVID-19 declaration under Section 564(b)(1) of the Act, 21 U.S.C.section 360bbb-3(b)(1), unless the authorization is terminated  or revoked sooner.       Influenza A by PCR NEGATIVE NEGATIVE Final   Influenza B by PCR NEGATIVE NEGATIVE Final    Comment: (NOTE) The Xpert Xpress SARS-CoV-2/FLU/RSV plus assay is intended as an aid in the diagnosis of influenza from Nasopharyngeal swab specimens and should not be used as a sole basis for treatment. Nasal washings and aspirates are unacceptable for Xpert Xpress SARS-CoV-2/FLU/RSV testing.  Fact Sheet for Patients: EntrepreneurPulse.com.au  Fact Sheet for Healthcare Providers: IncredibleEmployment.be  This test is not yet  approved or cleared by the [place] FDA and has been authorized for detection and/or diagnosis of SARS-CoV-2 by FDA under an Emergency Use Authorization (EUA). This EUA will remain in effect (meaning this test can be used) for the duration of the COVID-19 declaration under Section 564(b)(1) of the Act, 21 U.S.C. section 360bbb-3(b)(1), unless the authorization is terminated or revoked.  Performed at [facility], [place] [address] [place], [place] [address]          Radiology Studies: CT HEAD WO CONTRAST (5MM)  Result Date: 07/02/2021 CLINICAL DATA:  Mental status change, unknown cause EXAM: CT HEAD WITHOUT CONTRAST TECHNIQUE: Contiguous axial images were obtained from the base of the skull through the vertex without intravenous contrast. RADIATION DOSE REDUCTION: This exam was performed according to the departmental dose-optimization program which includes automated exposure control, adjustment of the mA and/or kV according to patient size and/or use of iterative reconstruction technique. COMPARISON:  CT head 06/30/2021 FINDINGS: Brain: Patchy and confluent areas of decreased attenuation are noted throughout the deep and periventricular white matter of the cerebral hemispheres bilaterally, compatible with chronic microvascular ischemic disease. No evidence of large-territorial acute infarction. No parenchymal hemorrhage. No mass lesion. No extra-axial collection. No mass effect or midline shift. No hydrocephalus. Basilar cisterns are patent. Vascular: No hyperdense vessel. Atherosclerotic calcifications are present within the cavernous internal carotid arteries. Skull: No acute fracture or focal lesion. Old left lamina papyracea fracture. Sinuses/Orbits: Paranasal sinuses and mastoid air cells are clear. The orbits are unremarkable. Other: None. IMPRESSION: No acute intracranial abnormality. Electronically Signed   By: Iven Finn M.D.   On: 07/02/2021 22:38   CT  ABDOMEN PELVIS W CONTRAST  Result Date: 07/02/2021 CLINICAL DATA:  Nausea and vomiting with abdominal pain EXAM: CT ABDOMEN AND PELVIS WITH CONTRAST TECHNIQUE: Multidetector CT imaging of the abdomen and pelvis was performed using the standard protocol following bolus administration of intravenous contrast. RADIATION DOSE REDUCTION: This exam was performed according to the departmental dose-optimization program which includes automated exposure control, adjustment of the mA and/or kV according to patient size and/or use of iterative reconstruction technique. CONTRAST:  192mL OMNIPAQUE IOHEXOL 300 MG/ML  SOLN COMPARISON:  07/14/2019 FINDINGS: Lower chest: No acute abnormality. Hepatobiliary: Fatty infiltration of the liver is noted. Gallbladder is within normal limits. Mild fullness of the common bile duct is noted although stable in appearance from the prior exam. Pancreas: Unremarkable. No pancreatic ductal dilatation or surrounding inflammatory changes. Spleen: Normal in size without focal abnormality. Adrenals/Urinary Tract: Adrenal glands are within normal limits. Kidneys demonstrate a normal enhancement pattern bilaterally. No renal calculi or obstructive changes are seen. Delayed images demonstrate normal excretion of contrast. The bladder is well distended.  Stomach/Bowel: The appendix is within normal limits. No obstructive or inflammatory changes of the colon are seen. Small bowel is within normal limits. Stomach is unremarkable. Vascular/Lymphatic: Aortic atherosclerosis. No enlarged abdominal or pelvic lymph nodes. Reproductive: Uterus and bilateral adnexa are unremarkable. Other: No abdominal wall hernia or abnormality. No abdominopelvic ascites. Musculoskeletal: No acute bony abnormality is noted. Degenerative changes of lumbar spine are seen. IMPRESSION: Fatty infiltration of the liver. Mildly prominent common bile duct but stable from the prior exam likely consistent with the patient's age. No acute  abnormality noted. Electronically Signed   By: Inez Catalina M.D.   On: 07/02/2021 21:05   DG Chest Port 1 View  Result Date: 07/02/2021 CLINICAL DATA:  Shortness of breath. EXAM: PORTABLE CHEST 1 VIEW COMPARISON:  Chest x-ray 05/05/2020. FINDINGS: The heart size and mediastinal contours are within normal limits. Both lungs are clear. The visualized skeletal structures are unremarkable. IMPRESSION: No active disease. Electronically Signed   By: Ronney Asters M.D.   On: 07/02/2021 20:14        Scheduled Meds:  amLODipine  5 mg Oral Daily   enoxaparin (LOVENOX) injection  40 mg Subcutaneous Q24H   feeding supplement  237 mL Oral BID BM   folic acid  1 mg Oral Daily   multivitamin with minerals  1 tablet Oral Daily   potassium chloride  40 mEq Oral Q2H   Continuous Infusions:  thiamine injection 500 mg (07/03/21 1017)     Georgette Shell, MD 07/03/2021, 12:40 PM

## 2021-07-03 NOTE — Evaluation (Signed)
Physical Therapy Evaluation Patient Details Name: Natasha Holland MRN: [id number] DOB: 09/10/1950 Today's Date: 07/03/2021  History of Present Illness  Patient is 71 y.o. female presented to ED with concern for weakness and recurrent falls. PMH significant for ETOH abuse, HTN. Patient admitted for and memory loss concerning for Wernicke encephalopathy syndrome.   Clinical Impression  Natasha Holland is 71 y.o. female admitted with above HPI and diagnosis. Patient is currently limited by functional impairments below (see PT problem list). Patient lives with her son and reports independence at baseline with no device, no family present to confirm PLOF. Currently pt requires Mod+2 assist for bed mobility and sit<>stand with RW. Pt has significant posterior lean in standing and involuntary twitches/movements of all four extremities limited pt's coordination and gait not safe to attempt this date. Patient will benefit from continued skilled PT interventions to address impairments and progress independence with mobility, recommending SNF for ST rehab at this time. Acute PT will follow and progress as able.        Recommendations for follow up therapy are one component of a multi-disciplinary discharge planning process, led by the attending physician.  Recommendations may be updated based on patient status, additional functional criteria and insurance authorization.  Follow Up Recommendations Home health PT (vs. SNF pending family assist available and improvement in mobility/coordination)    Assistance Recommended at Discharge    Patient can return home with the following       Equipment Recommendations Rolling walker (2 wheels)  Recommendations for Other Services  OT consult    Functional Status Assessment Patient has had a recent decline in their functional status and demonstrates the ability to make significant improvements in function in a reasonable and predictable amount of time.      Precautions / Restrictions Precautions Precautions: Fall Precaution Comments: can't recall falls Restrictions Weight Bearing Restrictions: No      Mobility  Bed Mobility Overal bed mobility: Needs Assistance Bed Mobility: Supine to Sit, Sit to Supine     Supine to sit: Mod assist, +2 for safety/equipment, HOB elevated Sit to supine: Mod assist, +2 for physical assistance, +2 for safety/equipment   General bed mobility comments: Patient requires cues for use of bed rail to pivot to EOB and is able to initiate bring LE's off edge. Mod assist for safety. Pt with posterior lean sitting EOB initially and +2 for safety. Mod+2 physical assist to return to supine.    Transfers Overall transfer level: Needs assistance Equipment used: Rolling walker (2 wheels) Transfers: Sit to/from Stand Sit to Stand: Mod assist, +2 safety/equipment, +2 physical assistance           General transfer comment: Mod+2 for sit<>stand from EOB. Assist to maintain safe hand placement on RW and to block feet for sliding anteriorly with rise. Pt with significant posterior lean in standing and unable to lift LE's off floort to attempt latearl steps at EOB. pt attempted 2x after stand from EOB.    Ambulation/Gait                  Stairs            Wheelchair Mobility    Modified Rankin (Stroke Patients Only)       Balance Overall balance assessment: Needs assistance Sitting-balance support: Feet supported Sitting balance-Leahy Scale: Good  Pertinent Vitals/Pain Pain Assessment Pain Assessment: No/denies pain    Home Living Family/patient expects to be discharged to:: Private residence Living Arrangements: Children Available Help at Discharge: Family;Available PRN/intermittently Type of Home: Apartment Home Access: Stairs to enter Entrance Stairs-Rails: Can reach both Entrance Stairs-Number of Steps: 3   Home Layout: One  level   Additional Comments: son goes to work 8am-5pm and pt is alone, she also works at [employer]    Prior Function Prior Level of Function : Independent/Modified Independent                     Journalist, newspaper        Extremity/Trunk Assessment   Upper Extremity Assessment Upper Extremity Assessment: Defer to OT evaluation    Lower Extremity Assessment Lower Extremity Assessment: Generalized weakness;RLE deficits/detail;LLE deficits/detail RLE Coordination: decreased gross motor LLE Coordination: decreased gross motor    Cervical / Trunk Assessment Cervical / Trunk Assessment: Kyphotic  Communication   Communication: No difficulties  Cognition Arousal/Alertness: Awake/alert Behavior During Therapy: WFL for tasks assessed/performed Overall Cognitive Status: No family/caregiver present to determine baseline cognitive functioning                                          General Comments      Exercises     Assessment/Plan    PT Assessment Patient needs continued PT services  PT Problem List Decreased strength;Decreased range of motion;Decreased activity tolerance;Decreased balance;Decreased mobility;Decreased knowledge of use of DME;Decreased knowledge of precautions       PT Treatment Interventions DME instruction;Therapeutic activities;Therapeutic exercise;Balance training;Neuromuscular re-education;Gait training;Stair training;Functional mobility training;Patient/family education    PT Goals (Current goals can be found in the Care Plan section)  Acute Rehab PT Goals Patient Stated Goal: return home and back to work PT Goal Formulation: With patient Time For Goal Achievement: 07/17/21 Potential to Achieve Goals: Good    Frequency 7X/week     Co-evaluation               AM-PAC PT "6 Clicks" Mobility  Outcome Measure Help needed turning from your back to your side while in a flat bed without using bedrails?: A Little Help needed  moving from lying on your back to sitting on the side of a flat bed without using bedrails?: A Lot Help needed moving to and from a bed to a chair (including a wheelchair)?: Total Help needed standing up from a chair using your arms (e.g., wheelchair or bedside chair)?: Total Help needed to walk in hospital room?: Total Help needed climbing 3-5 steps with a railing? : Total 6 Click Score: 9    End of Session Equipment Utilized During Treatment: Gait belt Activity Tolerance: Patient tolerated treatment well Patient left: in bed;with bed alarm set;with call bell/phone within reach (in chair position) Nurse Communication: Mobility status PT Visit Diagnosis: Muscle weakness (generalized) (M62.81);Difficulty in walking, not elsewhere classified (R26.2);Unsteadiness on feet (R26.81)    Time: 6759-1638 PT Time Calculation (min) (ACUTE ONLY): 21 min   Charges:   PT Evaluation $PT Eval Moderate Complexity: 1 Mod          Verner Mould, DPT Acute Rehabilitation Services Office [phone] Pager [phone]   Jacques Navy 07/03/2021, 1:06 PM

## 2021-07-03 NOTE — Evaluation (Signed)
Clinical/Bedside Swallow Evaluation Patient Details  Name: Natasha Holland MRN: [id number] Date of Birth: [birth year]  Today's Date: 07/03/2021 Time: SLP Start Time (ACUTE ONLY): 1330 SLP Stop Time (ACUTE ONLY): 1355 SLP Time Calculation (min) (ACUTE ONLY): 25 min  Past Medical History:  Past Medical History:  Diagnosis Date   Hypertension    Polycythemia vera(238.4)    Past Surgical History:  Past Surgical History:  Procedure Laterality Date   BRONCHIAL WASHINGS  07/16/2019   Procedure: BRONCHIAL WASHINGS;  Surgeon: Rigoberto Noel, MD;  Location: [facility] ENDOSCOPY;  Service: Cardiopulmonary;;   VIDEO BRONCHOSCOPY N/A 07/16/2019   Procedure: VIDEO BRONCHOSCOPY WITHOUT FLUORO;  Surgeon: Rigoberto Noel, MD;  Location: [facility] ENDOSCOPY;  Service: Cardiopulmonary;  Laterality: N/A;   HPI:  Patient is 71 y.o. female presented to ED with concern for weakness and recurrent falls. PMH significant for ETOH abuse, HTN. Patient admitted for and memory loss concerning for Wernicke encephalopathy syndrome.    Assessment / Plan / Recommendation  Clinical Impression  Patient appears with what is likely a cognitive-based dysphagia and that appears to be impacting the oral phase. In addition, patient is edentulous. She was cooperative but very confused, having hallucinations and calling out to people who are not there, asking SLP if he cooked the food, etc. Patient also presenting with severely impaired control of UE and use of hands to grasp utensils, cup. (seems ataxic) She tried to feed self but would drop fork and unable to adequately grasp anything. SLP fed patient and although mastication and oral transit of solids was prolonged, no overt s/s aspiration or penetration observed. SLP is recommending change diet slightly to Dys 3 solids, continue with thin liquids. SLP will plan to see patient at least one more time to ensure she is on LRD and tolerating without difficulty. SLP Visit Diagnosis: Dysphagia,  unspecified (R13.10)    Aspiration Risk  Mild aspiration risk;No limitations    Diet Recommendation Dysphagia 3 (Mech soft);Thin liquid   Liquid Administration via: Cup;Straw Medication Administration: Whole meds with liquid Supervision: Staff to assist with self feeding;Full supervision/cueing for compensatory strategies Compensations: Slow rate;Small sips/bites Postural Changes: Seated upright at 90 degrees    Other  Recommendations Oral Care Recommendations: Oral care BID;Staff/trained caregiver to provide oral care    Recommendations for follow up therapy are one component of a multi-disciplinary discharge planning process, led by the attending physician.  Recommendations may be updated based on patient status, additional functional criteria and insurance authorization.  Follow up Recommendations Other (comment) (TBD)      Assistance Recommended at Discharge Frequent or constant Supervision/Assistance  Functional Status Assessment Patient has had a recent decline in their functional status and demonstrates the ability to make significant improvements in function in a reasonable and predictable amount of time.  Frequency and Duration min 1 x/week  1 week       Prognosis Prognosis for Safe Diet Advancement: Fair Barriers to Reach Goals: Cognitive deficits      Swallow Study   General Date of Onset: 07/02/21 HPI: Patient is 71 y.o. female presented to ED with concern for weakness and recurrent falls. PMH significant for ETOH abuse, HTN. Patient admitted for and memory loss concerning for Wernicke encephalopathy syndrome. Type of Study: Bedside Swallow Evaluation Previous Swallow Assessment: none found Diet Prior to this Study: Thin liquids;Regular Temperature Spikes Noted: No Respiratory Status: Room air History of Recent Intubation: No Behavior/Cognition: Alert;Cooperative;Pleasant mood;Confused;Requires cueing Oral Cavity Assessment: Within Functional Limits Oral Care  Completed  by SLP: No Oral Cavity - Dentition: Edentulous Self-Feeding Abilities: Total assist Patient Positioning: Upright in bed Baseline Vocal Quality: Normal Volitional Cough: Cognitively unable to elicit Volitional Swallow: Unable to elicit    Oral/Motor/Sensory Function Overall Oral Motor/Sensory Function: Within functional limits   Ice Chips     Thin Liquid Thin Liquid: Within functional limits Presentation: Straw    Nectar Thick     Honey Thick     Puree Puree: Within functional limits   Solid     Solid: Impaired Oral Phase Impairments: Impaired mastication Oral Phase Functional Implications: Oral residue;Prolonged oral transit     Sonia Baller, MA, CCC-SLP Speech Therapy

## 2021-07-04 ENCOUNTER — Ambulatory Visit (HOSPITAL_COMMUNITY): Payer: Medicare Other

## 2021-07-04 LAB — CBC
HCT: 44.1 % (ref 36.0–46.0)
Hemoglobin: 14.7 g/dL (ref 12.0–15.0)
MCH: 33.9 pg (ref 26.0–34.0)
MCHC: 33.3 g/dL (ref 30.0–36.0)
MCV: 101.6 fL — ABNORMAL HIGH (ref 80.0–100.0)
Platelets: 236 10*3/uL (ref 150–400)
RBC: 4.34 MIL/uL (ref 3.87–5.11)
RDW: 19.9 % — ABNORMAL HIGH (ref 11.5–15.5)
WBC: 9.3 10*3/uL (ref 4.0–10.5)
nRBC: 0 % (ref 0.0–0.2)

## 2021-07-04 LAB — COMPREHENSIVE METABOLIC PANEL
ALT: 24 U/L (ref 0–44)
AST: 67 U/L — ABNORMAL HIGH (ref 15–41)
Albumin: 2.3 g/dL — ABNORMAL LOW (ref 3.5–5.0)
Alkaline Phosphatase: 119 U/L (ref 38–126)
Anion gap: 5 (ref 5–15)
BUN: 6 mg/dL — ABNORMAL LOW (ref 8–23)
CO2: 25 mmol/L (ref 22–32)
Calcium: 8.3 mg/dL — ABNORMAL LOW (ref 8.9–10.3)
Chloride: 107 mmol/L (ref 98–111)
Creatinine, Ser: 0.59 mg/dL (ref 0.44–1.00)
GFR, Estimated: >60 mL/min (ref >60)
Glucose, Bld: 78 mg/dL (ref 70–99)
Potassium: 4.6 mmol/L (ref 3.5–5.1)
Sodium: 137 mmol/L (ref 135–145)
Total Bilirubin: 1.6 mg/dL — ABNORMAL HIGH (ref 0.3–1.2)
Total Protein: 6.7 g/dL (ref 6.5–8.1)

## 2021-07-04 MED ORDER — SODIUM CHLORIDE 0.9 % IV SOLN: INTRAVENOUS | Status: DC

## 2021-07-04 MED ORDER — ACETAMINOPHEN 325 MG PO TABS
650.0000 mg | ORAL_TABLET | Freq: Once | ORAL | Status: AC
  Administered 2021-07-04: 650 mg via ORAL
  Filled 2021-07-04: qty 2

## 2021-07-04 NOTE — NC FL2 (Signed)
[place] LEVEL OF CARE SCREENING TOOL     IDENTIFICATION  Patient Name: Natasha Holland Birthdate: 1950/06/12 Sex: female Admission Date (Current Location): 07/02/2021  [facility] and [place] Number:  Herbalist and Address:         Provider Number: [id number]  Attending Physician Name and Address:  Georgette Shell, MD  Relative Name and Phone Number:  MASSY,CHRIS Pandora Leiter)   [phone]    Current Level of Care: Hospital Recommended Level of Care: [place] Prior Approval Number:    Date Approved/Denied:   PASRR Number: [id number]  Discharge Plan: SNF    Current Diagnoses: Patient Active Problem List   Diagnosis Date Noted   Wernicke encephalopathy syndrome 07/02/2021   Dysphagia 07/02/2021   Essential hypertension 12/07/2019   History of pancreatitis 12/07/2019   Acute pancreatitis 07/14/2019   Elevated blood pressure reading 07/14/2019   Alcohol use 07/14/2019   Thrombocytopenia ([place]) 07/14/2019   Common bile duct dilatation 07/14/2019    Orientation RESPIRATION BLADDER Height & Weight     Self, Situation, Place  Normal External catheter Weight: 55 kg Height:  5\' 2"  (157.5 cm)  BEHAVIORAL SYMPTOMS/MOOD NEUROLOGICAL BOWEL NUTRITION STATUS      Continent Diet (see d/c summary)  AMBULATORY STATUS COMMUNICATION OF NEEDS Skin   Extensive Assist Verbally Normal                       Personal Care Assistance Level of Assistance  Bathing, Feeding, Dressing Bathing Assistance: Maximum assistance Feeding assistance: Independent Dressing Assistance: Limited assistance     Functional Limitations Info  Sight, Hearing, Speech Sight Info: Adequate Hearing Info: Adequate Speech Info: Adequate    SPECIAL CARE FACTORS FREQUENCY  PT (By licensed PT), OT (By licensed OT)     PT Frequency: 5X/W OT Frequency: 5X/W            Contractures Contractures Info: Not present    Additional Factors Info  Code Status  Code Status Info: full             Current Medications (07/04/2021):  This is the current hospital active medication list Current Facility-Administered Medications  Medication Dose Route Frequency Provider Last Rate Last Admin   0.9 %  sodium chloride infusion   Intravenous Continuous Georgette Shell, MD 100 mL/hr at 07/04/21 1014 New Bag at 07/04/21 1014   amLODipine (NORVASC) tablet 5 mg  5 mg Oral Daily Tu, Ching T, DO   5 mg at 07/04/21 1005   enoxaparin (LOVENOX) injection 40 mg  40 mg Subcutaneous Q24H Tu, Ching T, DO   40 mg at 07/04/21 1005   feeding supplement (ENSURE ENLIVE / ENSURE PLUS) liquid 237 mL  237 mL Oral BID BM Tu, Ching T, DO   237 mL at 11/27/92 8546   folic acid (FOLVITE) tablet 1 mg  1 mg Oral Daily Tu, Ching T, DO   1 mg at 07/04/21 1005   LORazepam (ATIVAN) tablet 1-4 mg  1-4 mg Oral Q1H PRN Tu, Ching T, DO   1 mg at 07/03/21 1757   Or   LORazepam (ATIVAN) injection 1-4 mg  1-4 mg Intravenous Q1H PRN Tu, Ching T, DO       multivitamin with minerals tablet 1 tablet  1 tablet Oral Daily Tu, Ching T, DO   1 tablet at 07/04/21 1004   thiamine 500mg  in normal saline (17ml) IVPB  500 mg Intravenous TID Tu, Ching T,  DO 100 mL/hr at 07/04/21 1008 500 mg at 07/04/21 1008     Discharge Medications: Please see discharge summary for a list of discharge medications.  Relevant Imaging Results:  Relevant Lab Results:   Additional Information [place], [place]

## 2021-07-04 NOTE — TOC Initial Note (Signed)
Transition of Care [facility] [place]) - Initial/Assessment Note    Patient Details  Name: Natasha Holland MRN: [id number] Date of Birth: Jul 14, 1950  [facility]) CM/SW Contact:    Natasha Mage, LCSW Phone Number: 07/04/2021, 11:19 AM  Clinical Narrative:   Patient seen in follow up to PT recommendation of SNF.  Natasha Holland resides here in [place] with her son, works across the road at  [employer]. She intends on returning home and to work, but also admits that she is weak today and would be unable to follow through with those plans if discharged today.  With that in mind, she is agreeable to a bed search as a "back-up plan."  Bed search process explained and initiated. TOC will continue to follow during the course of hospitalization.                Expected Discharge Plan: Skilled Nursing Facility Barriers to Discharge: SNF Pending bed offer   Patient Goals and CMS Choice Patient states their goals for this hospitalization and ongoing recovery are:: go back to work [employer].[email or web address] Compare Post Acute Care list provided to:: Patient Choice offered to / list presented to : Patient  Expected Discharge Plan and Services Expected Discharge Plan: [place]   Discharge Planning Services: CM Consult Post Acute Care Choice: [place] Living arrangements for the past 2 months: Apartment                                      Prior Living Arrangements/Services Living arrangements for the past 2 months: Apartment Lives with:: Adult Children Patient language and need for interpreter reviewed:: Yes        Need for Family Participation in Patient Care: Yes (Comment) Care giver support system in place?: Yes (comment)   Criminal Activity/Legal Involvement Pertinent to Current Situation/Hospitalization: No - Comment as needed  Activities of Daily Living Home Assistive Devices/Equipment: None ADL Screening (condition at time of admission) Patient's  cognitive ability adequate to safely complete daily activities?: Yes Is the patient deaf or have difficulty hearing?: No Does the patient have difficulty seeing, even when wearing glasses/contacts?: No Does the patient have difficulty concentrating, remembering, or making decisions?: Yes Patient able to express need for assistance with ADLs?: Yes Does the patient have difficulty dressing or bathing?: No Independently performs ADLs?: Yes (appropriate for developmental age) Does the patient have difficulty walking or climbing stairs?: No Weakness of Legs: None Weakness of Arms/Hands: None  Permission Sought/Granted                  Emotional Assessment Appearance:: Appears stated age Attitude/Demeanor/Rapport: Engaged Affect (typically observed): Appropriate Orientation: : Oriented to Self, Oriented to Place, Oriented to Situation Alcohol / Substance Use: Alcohol Use Psych Involvement: No (comment)  Admission diagnosis:  Weakness [R53.1] Weight loss [R63.4] Wernicke encephalopathy [E51.2] Wernicke encephalopathy syndrome [E51.2] Patient Active Problem List   Diagnosis Date Noted   Wernicke encephalopathy syndrome 07/02/2021   Dysphagia 07/02/2021   Essential hypertension 12/07/2019   History of pancreatitis 12/07/2019   Acute pancreatitis 07/14/2019   Elevated blood pressure reading 07/14/2019   Alcohol use 07/14/2019   Thrombocytopenia ([place]) 07/14/2019   Common bile duct dilatation 07/14/2019   PCP:  Billie Ruddy, MD Pharmacy:   [facility] [place], [place]  [address] [place] [address] Phone: [phone] Fax: [phone]     Social Determinants of Health (SDOH) Interventions    Readmission Risk Interventions No flowsheet data found.

## 2021-07-04 NOTE — Progress Notes (Signed)
PROGRESS NOTE    Natasha Holland  QMG:[id number] DOB: [birth year] DOA: 07/02/2021 PCP: Billie Ruddy, MD   Brief Narrative: 71 year old female with history of alcohol abuse admitted with weakness and recurrent falls. CT head negative on 06/30/2021. She was seen by GI on 06/26/2021 as an outpatient and was scheduled to have a CT abdomen and pelvis and possible barium swallow and endoscopy. ED physician discussed with Dr. Lyndel Safe from GI and had no further recommendations during this hospital stay. ED physician also discussed with neurology Dr. Leonel Ramsay had concerns for Wernicke's Korsakoff syndrome and was advised to start the patient on high-dose thiamine.  He also recommended an MRI.   Assessment & Plan:   Principal Problem:   Wernicke encephalopathy syndrome Active Problems:   Alcohol use   Essential hypertension   Dysphagia   #1 possible Wernicke's encephalopathy patient with history of chronic alcohol abuse admitted with ataxia memory loss weakness and confusion.  Patient lives at home with her son. Patient has been started on high-dose thiamine  MRI -mild increased T2 hyperintense signal in the mamillary bodies bilaterally and periaqueductal gray matter as can be seen in the setting of Wernicke's encephalopathy. Continue CIWA protocol. PT eval recommending SNF. Appreciate neurology input continue high-dose thiamine for 3 to 5 days and then 200 mg daily indefinitely or until she stops drinking.   Discussed with Dr Theda Sers  #2 abnormal LFTs likely due to alcohol use.  CT of the abdomen and pelvis shows fatty liver and chronic stable CBD dilatation.  #3 hypertension on amlodipine her blood pressure has been soft 104/69.  I will DC amlodipine.  #4 dysphagia chronic followed by [facility]  GI planning for barium swallow as an outpatient. CT of the abdomen and pelvis-fatty liver mildly prominent common bile duct but stable from the prior exam consistent with patient's age. Speech  eval patient at the mild aspiration risk recommending dysphagia 3 diet mechanical soft diet with thin liquids.  #5 hypokalemia -k 3.1 replete  Mag 1.5 replete Recheck levels in am   Estimated body mass index is 22.18 kg/m as calculated from the following:   Height as of this encounter: 5\' 2"  (1.575 m).   Weight as of this encounter: 55 kg.  DVT prophylaxis: lovenox Code Status: full Family Communication: none Disposition Plan:  Status is: Inpatient  Consultants: none  Procedures: none Antimicrobials none  Subjective:  She is awake not oriented to hospital She remembers seeing a neurologist yesterday but does not know his name No overnight events  Objective: Vitals:   07/03/21 1430 07/03/21 2027 07/04/21 0524 07/04/21 1310  BP: 132/82 115/76 121/84 104/69  Pulse: (!) 105 96 (!) 103 (!) 101  Resp: 20 16 16 20   Temp: 98.3 F (36.8 C) 97.8 F (36.6 C) 98.4 F (36.9 C) 99.1 F (37.3 C)  TempSrc: Oral Oral Oral Oral  SpO2: 95% 92% 94% 93%  Weight:      Height:        Intake/Output Summary (Last 24 hours) at 07/04/2021 1337 Last data filed at 07/04/2021 0530 Gross per 24 hour  Intake 270 ml  Output 100 ml  Net 170 ml    Filed Weights   07/02/21 1933  Weight: 55 kg    Examination:no asterexis  General exam: Appears  Chronically ill looking elderly female  Respiratory system: Clear to auscultation. Respiratory effort normal. Cardiovascular system: S1 & S2 heard, RRR. No JVD, murmurs, rubs, gallops or clicks. No pedal edema. Gastrointestinal system: Abdomen is  nondistended, soft and nontender. No organomegaly or masses felt. Normal bowel sounds heard. Central nervous system: Confused follows some commands Does not know who the president is extremities: No edema  skin: No rashes, lesions or ulcers Psychiatry: Impaired   Data Reviewed: I have personally reviewed following labs and imaging studies  CBC: Recent Labs  Lab 07/02/21 1943 07/02/21 2019  WBC  8.4  --   NEUTROABS 4.1  --   HGB 13.9 15.6*  HCT 40.7 46.0  MCV 96.4  --   PLT 251  --     Basic Metabolic Panel: Recent Labs  Lab 07/02/21 1943 07/02/21 2019 07/03/21 0500  NA 135 139 135  K 3.9 3.5 3.1*  CL 101 101 101  CO2 27  --  26  GLUCOSE 103* 95 94  BUN 6* 4* 5*  CREATININE 0.52 0.50 0.54  CALCIUM 8.1*  --  8.0*  MG  --   --  1.5*    GFR: Estimated Creatinine Clearance: 51 mL/min (by C-G formula based on SCr of 0.54 mg/dL). Liver Function Tests: Recent Labs  Lab 07/02/21 1943 07/03/21 0500  AST 66* 56*  ALT 24 23  ALKPHOS 122 102  BILITOT 2.2* 2.1*  PROT 6.7 6.0*  ALBUMIN 2.3* 2.1*    Recent Labs  Lab 07/02/21 1943  LIPASE 21    No results for input(s): AMMONIA in the last 168 hours. Coagulation Profile: No results for input(s): INR, PROTIME in the last 168 hours. Cardiac Enzymes: No results for input(s): CKTOTAL, CKMB, CKMBINDEX, TROPONINI in the last 168 hours. BNP (last 3 results) Recent Labs    06/15/21 1409  PROBNP 74.0    HbA1C: No results for input(s): HGBA1C in the last 72 hours. CBG: No results for input(s): GLUCAP in the last 168 hours. Lipid Profile: No results for input(s): CHOL, HDL, LDLCALC, TRIG, CHOLHDL, LDLDIRECT in the last 72 hours. Thyroid Function Tests: No results for input(s): TSH, T4TOTAL, FREET4, T3FREE, THYROIDAB in the last 72 hours. Anemia Panel: No results for input(s): VITAMINB12, FOLATE, FERRITIN, TIBC, IRON, RETICCTPCT in the last 72 hours. Sepsis Labs: No results for input(s): PROCALCITON, LATICACIDVEN in the last 168 hours.  Recent Results (from the past 240 hour(s))  Urine Culture     Status: None   Collection Time: 06/28/21  4:02 PM   Specimen: Urine  Result Value Ref Range Status   MICRO NUMBER: [id number]  Final   SPECIMEN QUALITY: Adequate  Final   Sample Source NOT GIVEN  Final   STATUS: FINAL  Final   ISOLATE 1:   Final    Mixed genital flora isolated. These superficial bacteria are not  indicative of a urinary tract infection. No further organism identification is warranted on this specimen. If clinically indicated, recollect clean-catch, mid-stream urine and transfer  immediately to Urine Culture Transport Tube.   Resp Panel by RT-PCR (Flu A&B, Covid) Nasopharyngeal Swab     Status: None   Collection Time: 07/02/21 10:50 PM   Specimen: Nasopharyngeal Swab; Nasopharyngeal(NP) swabs in vial transport medium  Result Value Ref Range Status   SARS Coronavirus 2 by RT PCR NEGATIVE NEGATIVE Final    Comment: (NOTE) SARS-CoV-2 target nucleic acids are NOT DETECTED.  The SARS-CoV-2 RNA is generally detectable in upper respiratory specimens during the acute phase of infection. The lowest concentration of SARS-CoV-2 viral copies this assay can detect is 138 copies/mL. A negative result does not preclude SARS-Cov-2 infection and should not be used as the sole basis for  treatment or other patient management decisions. A negative result may occur with  improper specimen collection/handling, submission of specimen other than nasopharyngeal swab, presence of viral mutation(s) within the areas targeted by this assay, and inadequate number of viral copies(<138 copies/mL). A negative result must be combined with clinical observations, patient history, and epidemiological information. The expected result is Negative.  Fact Sheet for Patients:  EntrepreneurPulse.com.au  Fact Sheet for Healthcare Providers:  IncredibleEmployment.be  This test is no t yet approved or cleared by the [place] FDA and  has been authorized for detection and/or diagnosis of SARS-CoV-2 by FDA under an Emergency Use Authorization (EUA). This EUA will remain  in effect (meaning this test can be used) for the duration of the COVID-19 declaration under Section 564(b)(1) of the Act, 21 U.S.C.section 360bbb-3(b)(1), unless the authorization is terminated  or revoked sooner.        Influenza A by PCR NEGATIVE NEGATIVE Final   Influenza B by PCR NEGATIVE NEGATIVE Final    Comment: (NOTE) The Xpert Xpress SARS-CoV-2/FLU/RSV plus assay is intended as an aid in the diagnosis of influenza from Nasopharyngeal swab specimens and should not be used as a sole basis for treatment. Nasal washings and aspirates are unacceptable for Xpert Xpress SARS-CoV-2/FLU/RSV testing.  Fact Sheet for Patients: EntrepreneurPulse.com.au  Fact Sheet for Healthcare Providers: IncredibleEmployment.be  This test is not yet approved or cleared by the [place] FDA and has been authorized for detection and/or diagnosis of SARS-CoV-2 by FDA under an Emergency Use Authorization (EUA). This EUA will remain in effect (meaning this test can be used) for the duration of the COVID-19 declaration under Section 564(b)(1) of the Act, 21 U.S.C. section 360bbb-3(b)(1), unless the authorization is terminated or revoked.  Performed at [facility], [place] [address], [place], [place] [address]           Radiology Studies: CT HEAD WO CONTRAST (5MM)  Result Date: 07/02/2021 CLINICAL DATA:  Mental status change, unknown cause EXAM: CT HEAD WITHOUT CONTRAST TECHNIQUE: Contiguous axial images were obtained from the base of the skull through the vertex without intravenous contrast. RADIATION DOSE REDUCTION: This exam was performed according to the departmental dose-optimization program which includes automated exposure control, adjustment of the mA and/or kV according to patient size and/or use of iterative reconstruction technique. COMPARISON:  CT head 06/30/2021 FINDINGS: Brain: Patchy and confluent areas of decreased attenuation are noted throughout the deep and periventricular white matter of the cerebral hemispheres bilaterally, compatible with chronic microvascular ischemic disease. No evidence of large-territorial acute infarction. No  parenchymal hemorrhage. No mass lesion. No extra-axial collection. No mass effect or midline shift. No hydrocephalus. Basilar cisterns are patent. Vascular: No hyperdense vessel. Atherosclerotic calcifications are present within the cavernous internal carotid arteries. Skull: No acute fracture or focal lesion. Old left lamina papyracea fracture. Sinuses/Orbits: Paranasal sinuses and mastoid air cells are clear. The orbits are unremarkable. Other: None. IMPRESSION: No acute intracranial abnormality. Electronically Signed   By: Iven Finn M.D.   On: 07/02/2021 22:38   MR BRAIN WO CONTRAST  Result Date: 07/03/2021 CLINICAL DATA:  Ataxia EXAM: MRI HEAD WITHOUT CONTRAST TECHNIQUE: Multiplanar, multiecho pulse sequences of the brain and surrounding structures were obtained without intravenous contrast. COMPARISON:  No prior MRI, correlation is made with CT head 07/02/2021 FINDINGS: Evaluation is limited by motion artifact. Brain: No restricted diffusion to suggest acute or subacute infarct. No acute hemorrhage, mass, mass effect, or midline shift. No foci of hemosiderin deposition to suggest  remote hemorrhage. No hydrocephalus or extra-axial collection. Mild T2 hyperintense signal in the periventricular white matter, likely the sequela of chronic small vessel ischemic disease. Mildly increased T2 hyperintense signal in the mamillary bodies bilaterally (series 11, image 21); no increased T2 hyperintense signal along the third ventricle, in the dorsomedial thalami, or in the periaqueductal region. Vascular: Normal flow voids. Skull and upper cervical spine: Normal marrow signal. Sinuses/Orbits: Mild mucosal thickening in the right maxillary sinus and right posterior ethmoid air cells. Right-greater-than-left exophthalmos. Remote left medial orbital fracture. Other: None. IMPRESSION: Mildly increased T2 hyperintense signal in the mamillary bodies bilaterally, as can be seen in the setting of Wernicke encephalopathy;  however, no abnormal signal is seen in the other areas that can be typically involved. No other acute intracranial process. Electronically Signed   By: Merilyn Baba M.D.   On: 07/03/2021 19:27   CT ABDOMEN PELVIS W CONTRAST  Result Date: 07/02/2021 CLINICAL DATA:  Nausea and vomiting with abdominal pain EXAM: CT ABDOMEN AND PELVIS WITH CONTRAST TECHNIQUE: Multidetector CT imaging of the abdomen and pelvis was performed using the standard protocol following bolus administration of intravenous contrast. RADIATION DOSE REDUCTION: This exam was performed according to the departmental dose-optimization program which includes automated exposure control, adjustment of the mA and/or kV according to patient size and/or use of iterative reconstruction technique. CONTRAST:  186mL OMNIPAQUE IOHEXOL 300 MG/ML  SOLN COMPARISON:  07/14/2019 FINDINGS: Lower chest: No acute abnormality. Hepatobiliary: Fatty infiltration of the liver is noted. Gallbladder is within normal limits. Mild fullness of the common bile duct is noted although stable in appearance from the prior exam. Pancreas: Unremarkable. No pancreatic ductal dilatation or surrounding inflammatory changes. Spleen: Normal in size without focal abnormality. Adrenals/Urinary Tract: Adrenal glands are within normal limits. Kidneys demonstrate a normal enhancement pattern bilaterally. No renal calculi or obstructive changes are seen. Delayed images demonstrate normal excretion of contrast. The bladder is well distended. Stomach/Bowel: The appendix is within normal limits. No obstructive or inflammatory changes of the colon are seen. Small bowel is within normal limits. Stomach is unremarkable. Vascular/Lymphatic: Aortic atherosclerosis. No enlarged abdominal or pelvic lymph nodes. Reproductive: Uterus and bilateral adnexa are unremarkable. Other: No abdominal wall hernia or abnormality. No abdominopelvic ascites. Musculoskeletal: No acute bony abnormality is noted.  Degenerative changes of lumbar spine are seen. IMPRESSION: Fatty infiltration of the liver. Mildly prominent common bile duct but stable from the prior exam likely consistent with the patient's age. No acute abnormality noted. Electronically Signed   By: Inez Catalina M.D.   On: 07/02/2021 21:05   DG Chest Port 1 View  Result Date: 07/02/2021 CLINICAL DATA:  Shortness of breath. EXAM: PORTABLE CHEST 1 VIEW COMPARISON:  Chest x-ray 05/05/2020. FINDINGS: The heart size and mediastinal contours are within normal limits. Both lungs are clear. The visualized skeletal structures are unremarkable. IMPRESSION: No active disease. Electronically Signed   By: Ronney Asters M.D.   On: 07/02/2021 20:14        Scheduled Meds:  amLODipine  5 mg Oral Daily   enoxaparin (LOVENOX) injection  40 mg Subcutaneous Q24H   feeding supplement  237 mL Oral BID BM   folic acid  1 mg Oral Daily   multivitamin with minerals  1 tablet Oral Daily   Continuous Infusions:  sodium chloride 100 mL/hr at 07/04/21 1014   thiamine injection 500 mg (07/04/21 1008)     Georgette Shell, MD 07/04/2021, 1:37 PM

## 2021-07-04 NOTE — Plan of Care (Signed)

## 2021-07-04 NOTE — Evaluation (Signed)
Occupational Therapy Evaluation Patient Details Name: Natasha Holland MRN: [id number] DOB: 02-26-1951 Today's Date: 07/04/2021   History of Present Illness Patient is 71 y.o. female presented to ED with concern for weakness and recurrent falls. PMH significant for ETOH abuse, HTN. Patient admitted for and memory loss concerning for Wernicke encephalopathy syndrome.   Clinical Impression   Patient is a 71 year old female who was admitted for above. Patient reported living at home with son. Patient was noted to have increase ataxia with  BUE movement with L move than R. Patient noted to report that R side was dominant side but then noted to use LUE with poor grasp and motor control to attempt to do all tasks even with cues and trailing RUE that was more cooperative for self feeding tasks. Patient was noted to have increased BUE weakness, increased ataxia, decreased activity tolerance, decreased sitting balance and decreased endurance impacting participation in ADLs. Patient would need 24/7 caregiver support to transition home safely at this time.  Patient would continue to benefit from skilled OT services at this time while admitted and after d/c to address noted deficits in order to improve overall safety and independence in ADLs.       Recommendations for follow up therapy are one component of a multi-disciplinary discharge planning process, led by the attending physician.  Recommendations may be updated based on patient status, additional functional criteria and insurance authorization.   Follow Up Recommendations  Skilled nursing-short term rehab (<3 hours/day)    Assistance Recommended at Discharge Frequent or constant Supervision/Assistance  Patient can return home with the following Two people to help with walking and/or transfers;A lot of help with bathing/dressing/bathroom;A lot of help with walking and/or transfers;Direct supervision/assist for medications management;Assist for  transportation;Help with stairs or ramp for entrance;Direct supervision/assist for financial management;Assistance with feeding    Functional Status Assessment  Patient has had a recent decline in their functional status and demonstrates the ability to make significant improvements in function in a reasonable and predictable amount of time.  Equipment Recommendations  Other (comment)    Recommendations for Other Services       Precautions / Restrictions Precautions Precautions: Fall Precaution Comments: can't recall falls Restrictions Weight Bearing Restrictions: No Other Position/Activity Restrictions: likes to be called "Natasha Holland"      Mobility Bed Mobility   Bed Mobility: Rolling Rolling: Mod assist         General bed mobility comments: with increased cues and ataxic movements.    Transfers                          Balance Overall balance assessment: Needs assistance Sitting-balance support: Feet supported Sitting balance-Leahy Scale: Poor Sitting balance - Comments: patient noted to have lateral leaning to R side Postural control: Right lateral lean                                 ADL either performed or assessed with clinical judgement   ADL Overall ADL's : Needs assistance/impaired Eating/Feeding: Minimal assistance;Bed level Eating/Feeding Details (indicate cue type and reason): in chair position in bed with lateral lean to R side with min A to correct. patient needs hand over hand for drinks with bilateral hand grasp to hold but with continued increased ataxic movements. patient reports being R handed but is noted to consistently use L hand with noted increased ataxic movements  and poor grip strenth to attempt to self feed. Grooming: Wash/dry face;Oral care;Minimal assistance;Bed level   Upper Body Bathing: Bed level;Moderate assistance   Lower Body Bathing: Bed level;Maximal assistance   Upper Body Dressing : Bed level;Moderate  assistance   Lower Body Dressing: Bed level;Maximal assistance   Toilet Transfer: +2 for physical assistance;+2 for safety/equipment Toilet Transfer Details (indicate cue type and reason): unable to attempt at this time for safety. Toileting- Clothing Manipulation and Hygiene: +2 for safety/equipment;+2 for physical assistance       Functional mobility during ADLs: +2 for physical assistance;+2 for safety/equipment       Vision Patient Visual Report: No change from baseline       Perception     Praxis      Pertinent Vitals/Pain Pain Assessment Pain Assessment: No/denies pain     Hand Dominance Right   Extremity/Trunk Assessment Upper Extremity Assessment Upper Extremity Assessment: RUE deficits/detail;LUE deficits/detail RUE Deficits / Details: able to squeeze for fist with thumb index and middle finger. ring and pinky able to complete motion but have no participation in strengthening. elbow WNL with ataxic movements. elbow ROM WFL with increased time to process MMT questions. patient had more control over flexion and extension of RUE compared to L was able to avoid almost hitting herself on this side. RUE Coordination: decreased fine motor;decreased gross motor LUE Deficits / Details: patient is 2+/5 on strength for grasp with only participating digits being thumb and index finger. patient able to tolerate PROM of middle, ring, and pinky with no c/o pain. patient noted to have acrylic like nails in place that effect looks of digit alignment. patient appears to have poor control over elbow ROM with PROM WFL, AROM uncontrolled with patient hitting into face if therapist did not stop, no self correcting strategies. patient able to AROM shoulder to about 70 degrees with noted extension of elbow with lifting arm unable to keep flexed. LUE Coordination: decreased gross motor;decreased fine motor (ataxic movements of BUE with all movements)   Lower Extremity Assessment Lower Extremity  Assessment: Defer to PT evaluation   Cervical / Trunk Assessment Cervical / Trunk Assessment: Kyphotic   Communication Communication Communication: No difficulties   Cognition Arousal/Alertness: Awake/alert Behavior During Therapy: WFL for tasks assessed/performed Overall Cognitive Status: No family/caregiver present to determine baseline cognitive functioning                                 General Comments: patient seemed to have poor insight to deficits during session. patient reported getting herself this AM and getting to bathroom.     General Comments       Exercises     Shoulder Instructions      Home Living Family/patient expects to be discharged to:: Private residence Living Arrangements: Children Available Help at Discharge: Family;Available PRN/intermittently Type of Home: Apartment Home Access: Stairs to enter Entrance Stairs-Number of Steps: 3 Entrance Stairs-Rails: Can reach both Home Layout: One level     Bathroom Shower/Tub: International aid/development worker Accessibility: Yes       Additional Comments: son goes to work 8am-5pm and pt is alone, she also works at [employer]      Prior Functioning/Environment Prior Level of Function : Independent/Modified Independent                        OT Problem List: Decreased strength;Decreased activity  tolerance;Decreased coordination;Decreased knowledge of use of DME or AE;Impaired UE functional use;Decreased safety awareness;Decreased knowledge of precautions      OT Treatment/Interventions: Self-care/ADL training;Energy conservation;Neuromuscular education;Patient/family education;Therapeutic activities;Balance training;DME and/or AE instruction    OT Goals(Current goals can be found in the care plan section) Acute Rehab OT Goals Patient Stated Goal: to get better OT Goal Formulation: With patient Time For Goal Achievement: 07/18/21 Potential to Achieve Goals: Good  OT Frequency: Min  2X/week    Co-evaluation              AM-PAC OT "6 Clicks" Daily Activity     Outcome Measure Help from another person eating meals?: A Little Help from another person taking care of personal grooming?: A Little Help from another person toileting, which includes using toliet, bedpan, or urinal?: A Lot Help from another person bathing (including washing, rinsing, drying)?: A Lot Help from another person to put on and taking off regular upper body clothing?: A Lot Help from another person to put on and taking off regular lower body clothing?: A Lot 6 Click Score: 14   End of Session Nurse Communication: Mobility status;Other (comment) (education on how to promote self feeding with patient)  Activity Tolerance: Patient tolerated treatment well Patient left: in bed;with call bell/phone within reach;with bed alarm set  OT Visit Diagnosis: Unsteadiness on feet (R26.81);Ataxia, unspecified (R27.0);Repeated falls (R29.6);History of falling (Z91.81)                Time: 5573-2202 OT Time Calculation (min): 38 min Charges:  OT General Charges $OT Visit: 1 Visit OT Evaluation $OT Eval Moderate Complexity: 1 Mod OT Treatments $Self Care/Home Management : 23-37 mins  Jackelyn Poling OTR/L, MS Acute Rehabilitation Department Office# [phone] Pager# [phone]   Marcellina Millin 07/04/2021, 11:51 AM

## 2021-07-05 DIAGNOSIS — R7989 Other specified abnormal findings of blood chemistry: Secondary | ICD-10-CM

## 2021-07-05 DIAGNOSIS — E440 Moderate protein-calorie malnutrition: Secondary | ICD-10-CM

## 2021-07-05 LAB — COMPREHENSIVE METABOLIC PANEL
ALT: 20 U/L (ref 0–44)
AST: 50 U/L — ABNORMAL HIGH (ref 15–41)
Albumin: 1.8 g/dL — ABNORMAL LOW (ref 3.5–5.0)
Alkaline Phosphatase: 90 U/L (ref 38–126)
Anion gap: 4 — ABNORMAL LOW (ref 5–15)
BUN: 5 mg/dL — ABNORMAL LOW (ref 8–23)
CO2: 20 mmol/L — ABNORMAL LOW (ref 22–32)
Calcium: 7.7 mg/dL — ABNORMAL LOW (ref 8.9–10.3)
Chloride: 110 mmol/L (ref 98–111)
Creatinine, Ser: 0.51 mg/dL (ref 0.44–1.00)
GFR, Estimated: >60 mL/min (ref >60)
Glucose, Bld: 73 mg/dL (ref 70–99)
Potassium: 4.2 mmol/L (ref 3.5–5.1)
Sodium: 134 mmol/L — ABNORMAL LOW (ref 135–145)
Total Bilirubin: 1.1 mg/dL (ref 0.3–1.2)
Total Protein: 5.3 g/dL — ABNORMAL LOW (ref 6.5–8.1)

## 2021-07-05 LAB — CBC
HCT: 35.2 % — ABNORMAL LOW (ref 36.0–46.0)
Hemoglobin: 12.1 g/dL (ref 12.0–15.0)
MCH: 34 pg (ref 26.0–34.0)
MCHC: 34.4 g/dL (ref 30.0–36.0)
MCV: 98.9 fL (ref 80.0–100.0)
Platelets: 215 10*3/uL (ref 150–400)
RBC: 3.56 MIL/uL — ABNORMAL LOW (ref 3.87–5.11)
RDW: 19.2 % — ABNORMAL HIGH (ref 11.5–15.5)
WBC: 8.4 10*3/uL (ref 4.0–10.5)
nRBC: 0 % (ref 0.0–0.2)

## 2021-07-05 NOTE — Assessment & Plan Note (Addendum)
Patient with a history of alcohol use. MRI significant for mild increased T2 hyperintense signal in bilateral mamillary bodies. Thiamine (Vitamin B1) of 47.8 (low). Patient managed on high-dose thiamine with improvement with transition to thiamine 200 mg daily per neurology recommendations. PT/OT recommending SNF. Recheck thiamine level as an outpatient.

## 2021-07-05 NOTE — TOC Progression Note (Signed)
Transition of Care [facility]) - Progression Note    Patient Details  Name: Natasha Holland MRN: [id number] Date of Birth: 04-Jun-1951  Transition of Care [facility]) CM/SW [place], [place] Phone Number: 07/05/2021, 11:43 AM  Clinical Narrative:   Went over bed offers with son and patient.  They select [facility].  Insurance authorization initiated. TOC will continue to follow during the course of hospitalization.     Expected Discharge Plan: Skilled Nursing Facility Barriers to Discharge: Other (must enter comment) (insurance auth)  Expected Discharge Plan and Services Expected Discharge Plan: [place]   Discharge Planning Services: CM Consult Post Acute Care Choice: [place] Living arrangements for the past 2 months: Apartment                                       Social Determinants of Health (SDOH) Interventions    Readmission Risk Interventions No flowsheet data found.

## 2021-07-05 NOTE — Assessment & Plan Note (Addendum)
Chronic. Patient is followed by [facility] with plan for outpatient barium swallow. Speech therapy consulted this admission. Speech therapy recommendations (1/30): 1. Dysphagia 3 (Mech soft);Thin liquid  2. Liquid Administration via: Cup;Straw 3. Medication Administration: Whole meds with liquid 4. Supervision: Staff to assist with self feeding;Full supervision/cueing for compensatory strategies 5. Compensations: Slow rate;Small sips/bites 6. Postural Changes: Seated upright at 90 degrees

## 2021-07-05 NOTE — Assessment & Plan Note (Signed)
Dietitian recommendations: 1. Ensure Plus High Protein po BID, each supplement provides 350 kcal and 20 grams of protein.

## 2021-07-05 NOTE — Progress Notes (Signed)
Occupational Therapy Treatment Patient Details Name: Natasha Holland MRN: [id number] DOB: 05/20/1951 Today's Date: 07/05/2021   History of present illness Patient is 71 y.o. female presented to ED with concern for weakness and recurrent falls. PMH significant for ETOH abuse, HTN. Patient admitted for and memory loss concerning for Wernicke encephalopathy syndrome.   OT comments  Patient's son was present during session on this date. Patient and son were educated on how to set up meal to promote patients independence in self feeding prior to patient being fed. Patient and son verbalized understanding. Patient was able to sit on edge of bed with midline posture with min guard. Patients HR noted to increase to 125 bpm with EOB positioning standing deferred at this time.Patient would continue to benefit from skilled OT services at this time while admitted and after d/c to address noted deficits in order to improve overall safety and independence in ADLs.     Recommendations for follow up therapy are one component of a multi-disciplinary discharge planning process, led by the attending physician.  Recommendations may be updated based on patient status, additional functional criteria and insurance authorization.    Follow Up Recommendations  Skilled nursing-short term rehab (<3 hours/day)    Assistance Recommended at Discharge Frequent or constant Supervision/Assistance  Patient can return home with the following  Two people to help with walking and/or transfers;A lot of help with bathing/dressing/bathroom;A lot of help with walking and/or transfers;Direct supervision/assist for medications management;Assist for transportation;Help with stairs or ramp for entrance;Direct supervision/assist for financial management;Assistance with feeding   Equipment Recommendations  Other (comment) (defer to next venue)    Recommendations for Other Services      Precautions / Restrictions Precautions Precautions:  Fall Restrictions Weight Bearing Restrictions: No Other Position/Activity Restrictions: likes to be called "Natasha Holland"       Mobility Bed Mobility Overal bed mobility: Needs Assistance       Supine to sit: Max assist, HOB elevated Sit to supine: Max assist   General bed mobility comments: with increased time and cues, increased education to account for fear of falling    Transfers                         Balance Overall balance assessment: Needs assistance Sitting-balance support: Feet supported Sitting balance-Leahy Scale: Fair                                     ADL either performed or assessed with clinical judgement   ADL Overall ADL's : Needs assistance/impaired Eating/Feeding: Minimal assistance;Bed level Eating/Feeding Details (indicate cue type and reason): in chair position in bed with continued leaning to R side. patient was able to hold spoon in R hand with scooping and bringing to mouth with less than 5% spillage noted. patient ws cued on scoop sizes and required hand over hand at times to reposition spoon if it dropped. family and nurse were educated on importance of patietn trying these tasks for herself prior to staff completing for her.                                   General ADL Comments: patient was able to sit on edge of bed with HR noted to increase to 125 bpm standing was not attempted on this date. patient did report  some dizziness with sitting EOB. no lateral leaning was noted sitting on edge of bed with patient able to maintain stittng balance in midline with min guard.    Extremity/Trunk Assessment              Vision       Perception     Praxis      Cognition Arousal/Alertness: Awake/alert Behavior During Therapy: WFL for tasks assessed/performed Overall Cognitive Status: No family/caregiver present to determine baseline cognitive functioning                                  General Comments: Patient continues to have poor insight over deficits. son was present during session.        Exercises      Shoulder Instructions       General Comments      Pertinent Vitals/ Pain       Pain Assessment Pain Assessment: No/denies pain  Home Living                                          Prior Functioning/Environment              Frequency  Min 2X/week        Progress Toward Goals  OT Goals(current goals can now be found in the care plan section)  Progress towards OT goals: Progressing toward goals     Plan Discharge plan remains appropriate    Co-evaluation                 AM-PAC OT "6 Clicks" Daily Activity     Outcome Measure   Help from another person eating meals?: A Little Help from another person taking care of personal grooming?: A Little Help from another person toileting, which includes using toliet, bedpan, or urinal?: A Lot Help from another person bathing (including washing, rinsing, drying)?: A Lot Help from another person to put on and taking off regular upper body clothing?: A Lot Help from another person to put on and taking off regular lower body clothing?: A Lot 6 Click Score: 14    End of Session    OT Visit Diagnosis: Unsteadiness on feet (R26.81);Ataxia, unspecified (R27.0);Repeated falls (R29.6);History of falling (Z91.81)   Activity Tolerance Patient tolerated treatment well   Patient Left in bed;with call bell/phone within reach;with bed alarm set;with family/visitor present   Nurse Communication Other (comment) (self feeding with RUE)        Time: 6503-5465 OT Time Calculation (min): 34 min  Charges: OT General Charges $OT Visit: 1 Visit OT Treatments $Self Care/Home Management : 23-37 mins  Jackelyn Poling OTR/L, MS Acute Rehabilitation Department Office# [phone] Pager# [phone]   Marcellina Millin 07/05/2021, 1:00 PM

## 2021-07-05 NOTE — Assessment & Plan Note (Addendum)
Likely related to alcohol use. CT abdomen/pelvis with evidence of fatty liver disease. Stable.

## 2021-07-05 NOTE — Hospital Course (Addendum)
Natasha Holland is a 71 y.o. female with a history of alcohol use and hypertension presented secondary to weakness and recurrent falls. Concern on admission for wernicke's encephalopathy. Patient managed on high dose thiamine with improvement. PT/OT recommending SNF. Patient developed mild symptoms consistent with alcohol withdrawal and has been started on a Librium taper. Patient also developed an elevated temperature with associated tachycardia and soft blood pressure. Blood cultures obtained but no source for possible infection. Workup is so far negative.

## 2021-07-05 NOTE — Assessment & Plan Note (Addendum)
Noted. Patient does not plan to drink. Family plan on ensuring limited access to alcohol in the future. Patient received a Librium taper, which she completed.

## 2021-07-05 NOTE — Progress Notes (Signed)
PROGRESS NOTE    Natasha Holland  DTO:[id number] DOB: 1951/06/04 DOA: 07/02/2021 PCP: Billie Ruddy, MD   Brief Narrative: Natasha Holland is a 71 y.o. female with a history of alcohol use and hypertension presented secondary to weakness and recurrent falls. Concern on admission for wernicke's encephalopathy. Patient managed on high dose thiamine with improvement. PT/OT recommending SNF.   Assessment & Plan:   Assessment and Plan: * Wernicke encephalopathy syndrome- (present on admission) Patient with a history of alcohol use. MRI significant for mild increased T2 hyperintense signal in bilateral mamillary bodies. Patient managed on high-dose thiamine with improvement. Recommendations to transition to thiamine 200 mg for maintenance. PT/OT recommending SNF. -Transition to thiamine 200 mg daily in AM  Elevated LFTs Likely related to alcohol use. CT abdomen/pelvis with evidence of fatty liver disease.  Malnutrition of moderate degree Dietitian recommendations: Ensure Plus High Protein po BID, each supplement provides 350 kcal and 20 grams of protein.  Dysphagia Chronic. Patient is followed by [facility] with plan for outpatient barium swallow. Speech therapy consulted this admission. Speech therapy recommendations (1/30): Dysphagia 3 (Mech soft);Thin liquid  Liquid Administration via: Cup;Straw Medication Administration: Whole meds with liquid Supervision: Staff to assist with self feeding;Full supervision/cueing for compensatory strategies Compensations: Slow rate;Small sips/bites Postural Changes: Seated upright at 90 degrees   Essential hypertension- (present on admission) Patient is on amlodipine as an outpatient which is held. Currently normotensive.  Alcohol use- (present on admission) Noted. Patient does not plan to drink. Family plan on ensuring limited access to alcohol in the future.     DVT prophylaxis: Lovenox Code Status:   Code Status: Full Code Family  Communication: Son at bedside Disposition Plan: Discharge to SNF in 24 hours pending completion of IV thiamine   Consultants:  Neurology  Procedures:  None  Antimicrobials: None    Subjective: Patient reports no concerns today.  Objective: Vitals:   07/04/21 0524 07/04/21 1310 07/04/21 1930 07/05/21 0558  BP: 121/84 104/69 130/85 (!) 133/93  Pulse: (!) 103 (!) 101 (!) 109 (!) 102  Resp: 16 20 16 16   Temp: 98.4 F (36.9 C) 99.1 F (37.3 C) 99.3 F (37.4 C) 98.7 F (37.1 C)  TempSrc: Oral Oral Oral Oral  SpO2: 94% 93% 94% 94%  Weight:      Height:        Intake/Output Summary (Last 24 hours) at 07/05/2021 1842 Last data filed at 07/05/2021 1721 Gross per 24 hour  Intake 2951.16 ml  Output 850 ml  Net 2101.16 ml   Filed Weights   07/02/21 1933  Weight: 55 kg    Examination:  General exam: Appears calm and comfortable  Respiratory system: Clear to auscultation. Respiratory effort normal. Cardiovascular system: S1 & S2 heard, RRR. No murmurs, rubs, gallops or clicks. Gastrointestinal system: Abdomen is nondistended, soft and nontender. No organomegaly or masses felt. Normal bowel sounds heard. Central nervous system: Alert and oriented to person and place. No focal neurological deficits. Musculoskeletal: No edema. No calf tenderness Skin: No cyanosis. No rashes     Data Reviewed: I have personally reviewed following labs and imaging studies  CBC Lab Results  Component Value Date   WBC 8.4 07/05/2021   RBC 3.56 (L) 07/05/2021   HGB 12.1 07/05/2021   HCT 35.2 (L) 07/05/2021   MCV 98.9 07/05/2021   MCH 34.0 07/05/2021   PLT 215 07/05/2021   MCHC 34.4 07/05/2021   RDW 19.2 (H) 07/05/2021   LYMPHSABS 2.6 07/02/2021  MONOABS 1.1 (H) 07/02/2021   EOSABS 0.3 07/02/2021   BASOSABS 0.2 (H) 82/50/5397     Last metabolic panel Lab Results  Component Value Date   NA 134 (L) 07/05/2021   K 4.2 07/05/2021   CL 110 07/05/2021   CO2 20 (L) 07/05/2021   BUN  5 (L) 07/05/2021   CREATININE 0.51 07/05/2021   GLUCOSE 73 07/05/2021   GFRNONAA >60 07/05/2021   GFRAA 103 12/23/2019   CALCIUM 7.7 (L) 07/05/2021   PHOS 4.6 07/14/2019   PROT 5.3 (L) 07/05/2021   ALBUMIN 1.8 (L) 07/05/2021   BILITOT 1.1 07/05/2021   ALKPHOS 90 07/05/2021   AST 50 (H) 07/05/2021   ALT 20 07/05/2021   ANIONGAP 4 (L) 07/05/2021    CBG (last 3)  No results for input(s): GLUCAP in the last 72 hours.   GFR: Estimated Creatinine Clearance: 51 mL/min (by C-G formula based on SCr of 0.51 mg/dL).  Coagulation Profile: No results for input(s): INR, PROTIME in the last 168 hours.  Recent Results (from the past 240 hour(s))  Urine Culture     Status: None   Collection Time: 06/28/21  4:02 PM   Specimen: Urine  Result Value Ref Range Status   MICRO NUMBER: [id number]  Final   SPECIMEN QUALITY: Adequate  Final   Sample Source NOT GIVEN  Final   STATUS: FINAL  Final   ISOLATE 1:   Final    Mixed genital flora isolated. These superficial bacteria are not indicative of a urinary tract infection. No further organism identification is warranted on this specimen. If clinically indicated, recollect clean-catch, mid-stream urine and transfer  immediately to Urine Culture Transport Tube.   Resp Panel by RT-PCR (Flu A&B, Covid) Nasopharyngeal Swab     Status: None   Collection Time: 07/02/21 10:50 PM   Specimen: Nasopharyngeal Swab; Nasopharyngeal(NP) swabs in vial transport medium  Result Value Ref Range Status   SARS Coronavirus 2 by RT PCR NEGATIVE NEGATIVE Final    Comment: (NOTE) SARS-CoV-2 target nucleic acids are NOT DETECTED.  The SARS-CoV-2 RNA is generally detectable in upper respiratory specimens during the acute phase of infection. The lowest concentration of SARS-CoV-2 viral copies this assay can detect is 138 copies/mL. A negative result does not preclude SARS-Cov-2 infection and should not be used as the sole basis for treatment or other patient management  decisions. A negative result may occur with  improper specimen collection/handling, submission of specimen other than nasopharyngeal swab, presence of viral mutation(s) within the areas targeted by this assay, and inadequate number of viral copies(<138 copies/mL). A negative result must be combined with clinical observations, patient history, and epidemiological information. The expected result is Negative.  Fact Sheet for Patients:  EntrepreneurPulse.com.au  Fact Sheet for Healthcare Providers:  IncredibleEmployment.be  This test is no t yet approved or cleared by the [place] FDA and  has been authorized for detection and/or diagnosis of SARS-CoV-2 by FDA under an Emergency Use Authorization (EUA). This EUA will remain  in effect (meaning this test can be used) for the duration of the COVID-19 declaration under Section 564(b)(1) of the Act, 21 U.S.C.section 360bbb-3(b)(1), unless the authorization is terminated  or revoked sooner.       Influenza A by PCR NEGATIVE NEGATIVE Final   Influenza B by PCR NEGATIVE NEGATIVE Final    Comment: (NOTE) The Xpert Xpress SARS-CoV-2/FLU/RSV plus assay is intended as an aid in the diagnosis of influenza from Nasopharyngeal swab specimens and should not be  used as a sole basis for treatment. Nasal washings and aspirates are unacceptable for Xpert Xpress SARS-CoV-2/FLU/RSV testing.  Fact Sheet for Patients: EntrepreneurPulse.com.au  Fact Sheet for Healthcare Providers: IncredibleEmployment.be  This test is not yet approved or cleared by the [place] FDA and has been authorized for detection and/or diagnosis of SARS-CoV-2 by FDA under an Emergency Use Authorization (EUA). This EUA will remain in effect (meaning this test can be used) for the duration of the COVID-19 declaration under Section 564(b)(1) of the Act, 21 U.S.C. section 360bbb-3(b)(1), unless the  authorization is terminated or revoked.  Performed at [facility], [place] [address] [place], [place] [address]         Radiology Studies: MR BRAIN WO CONTRAST  Result Date: 07/03/2021 CLINICAL DATA:  Ataxia EXAM: MRI HEAD WITHOUT CONTRAST TECHNIQUE: Multiplanar, multiecho pulse sequences of the brain and surrounding structures were obtained without intravenous contrast. COMPARISON:  No prior MRI, correlation is made with CT head 07/02/2021 FINDINGS: Evaluation is limited by motion artifact. Brain: No restricted diffusion to suggest acute or subacute infarct. No acute hemorrhage, mass, mass effect, or midline shift. No foci of hemosiderin deposition to suggest remote hemorrhage. No hydrocephalus or extra-axial collection. Mild T2 hyperintense signal in the periventricular white matter, likely the sequela of chronic small vessel ischemic disease. Mildly increased T2 hyperintense signal in the mamillary bodies bilaterally (series 11, image 21); no increased T2 hyperintense signal along the third ventricle, in the dorsomedial thalami, or in the periaqueductal region. Vascular: Normal flow voids. Skull and upper cervical spine: Normal marrow signal. Sinuses/Orbits: Mild mucosal thickening in the right maxillary sinus and right posterior ethmoid air cells. Right-greater-than-left exophthalmos. Remote left medial orbital fracture. Other: None. IMPRESSION: Mildly increased T2 hyperintense signal in the mamillary bodies bilaterally, as can be seen in the setting of Wernicke encephalopathy; however, no abnormal signal is seen in the other areas that can be typically involved. No other acute intracranial process. Electronically Signed   By: Merilyn Baba M.D.   On: 07/03/2021 19:27        Scheduled Meds:  enoxaparin (LOVENOX) injection  40 mg Subcutaneous Q24H   feeding supplement  237 mL Oral BID BM   folic acid  1 mg Oral Daily   multivitamin with minerals  1 tablet Oral Daily    Continuous Infusions:  sodium chloride 100 mL/hr at 07/05/21 1013   thiamine injection 500 mg (07/05/21 1721)     LOS: 3 days     Cordelia Poche, MD [facility] 07/05/2021, 6:42 PM  If 7PM-7AM, please contact night-coverage [email or web address]

## 2021-07-05 NOTE — Assessment & Plan Note (Addendum)
Patient is on amlodipine as an outpatient which is held. Patient developed soft blood pressure that responded well to IV fluid bolus. Will discontinue amlodipine and start low-dose metoprolol for associated tachycardia.

## 2021-07-06 ENCOUNTER — Inpatient Hospital Stay (HOSPITAL_COMMUNITY): Payer: Medicare Other

## 2021-07-06 DIAGNOSIS — F10939 Alcohol use, unspecified with withdrawal, unspecified: Secondary | ICD-10-CM

## 2021-07-06 LAB — VITAMIN B1: Vitamin B1 (Thiamine): 47.8 nmol/L — ABNORMAL LOW (ref 66.5–200.0)

## 2021-07-06 LAB — SARS CORONAVIRUS 2 (TAT 6-24 HRS): SARS Coronavirus 2: NEGATIVE

## 2021-07-06 IMAGING — DX DG CHEST 1V PORT
1 series · 1 of 1 positions shown · non-contrast
Comparison: [DATE]

CLINICAL DATA: Tachypnea

EXAM:
PORTABLE CHEST 1 VIEW

[chest ap]
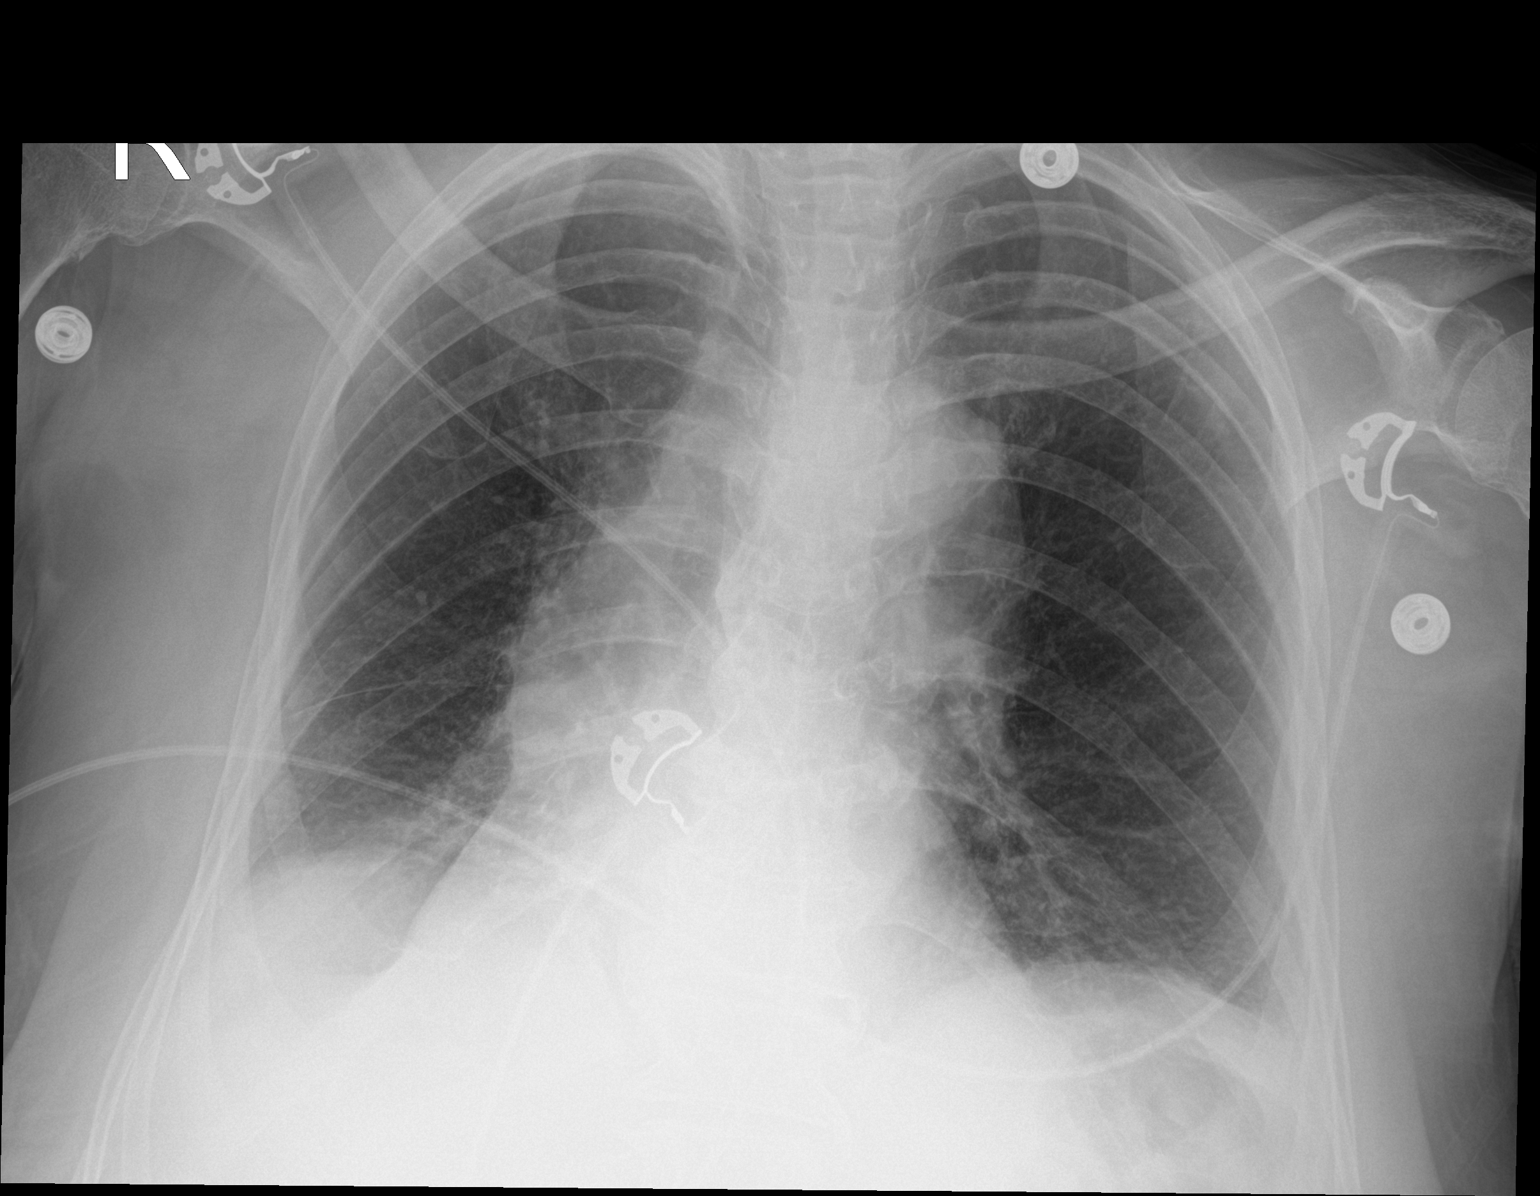

[1 of 1 positions shown; findings below may reference images not displayed]

FINDINGS: Single frontal view of the chest demonstrates a stable cardiac
silhouette. Ectasia of the thoracic aorta. No acute airspace
disease, effusion, or pneumothorax. No acute bony abnormalities.
IMPRESSION: 1. Stable chest, no acute process.

## 2021-07-06 MED ORDER — CHLORDIAZEPOXIDE HCL 5 MG PO CAPS
25.0000 mg | ORAL_CAPSULE | Freq: Every day | ORAL | Status: AC
  Administered 2021-07-09: 25 mg via ORAL
  Filled 2021-07-06: qty 5

## 2021-07-06 MED ORDER — CHLORDIAZEPOXIDE HCL 5 MG PO CAPS
25.0000 mg | ORAL_CAPSULE | Freq: Three times a day (TID) | ORAL | Status: AC
  Administered 2021-07-07 (×2): 25 mg via ORAL
  Filled 2021-07-06 (×2): qty 5

## 2021-07-06 MED ORDER — LORAZEPAM 2 MG/ML IJ SOLN
1.0000 mg | Freq: Once | INTRAMUSCULAR | Status: AC
  Administered 2021-07-06: 1 mg via INTRAVENOUS
  Filled 2021-07-06: qty 1

## 2021-07-06 MED ORDER — LORAZEPAM 2 MG/ML IJ SOLN
1.0000 mg | INTRAMUSCULAR | Status: DC | PRN
  Administered 2021-07-08: 1 mg via INTRAVENOUS
  Filled 2021-07-06 (×2): qty 1

## 2021-07-06 MED ORDER — THIAMINE HCL 100 MG PO TABS
200.0000 mg | ORAL_TABLET | Freq: Every day | ORAL | Status: DC
  Administered 2021-07-07 – 2021-07-10 (×4): 200 mg via ORAL
  Filled 2021-07-06 (×4): qty 2

## 2021-07-06 MED ORDER — CHLORDIAZEPOXIDE HCL 5 MG PO CAPS
25.0000 mg | ORAL_CAPSULE | ORAL | Status: AC
  Administered 2021-07-08 (×2): 25 mg via ORAL
  Filled 2021-07-06 (×2): qty 5

## 2021-07-06 MED ORDER — LORAZEPAM 1 MG PO TABS: 1.0000 mg | ORAL_TABLET | ORAL | Status: DC | PRN

## 2021-07-06 MED ORDER — CHLORDIAZEPOXIDE HCL 5 MG PO CAPS
25.0000 mg | ORAL_CAPSULE | Freq: Four times a day (QID) | ORAL | Status: AC
  Administered 2021-07-06 – 2021-07-07 (×4): 25 mg via ORAL
  Filled 2021-07-06 (×4): qty 5

## 2021-07-06 NOTE — Progress Notes (Signed)
° ° °  OVERNIGHT PROGRESS REPORT  Notified by RN for patient exhibiting sign and symptoms in -line with a score of 7 on CIWA scale.  Tachycardic etc. by bedside account.  Single dose med ordered.  Gershon Cull MSNA MSN ACNPC-AG Acute Care Nurse Practitioner [place]

## 2021-07-06 NOTE — Progress Notes (Signed)
PROGRESS NOTE    Natasha Holland  ATF:[id number] DOB: 1951/04/07 DOA: 07/02/2021 PCP: Billie Ruddy, MD   Brief Narrative: Natasha Holland is a 71 y.o. female with a history of alcohol use and hypertension presented secondary to weakness and recurrent falls. Concern on admission for wernicke's encephalopathy. Patient managed on high dose thiamine with improvement. PT/OT recommending SNF. Patient developed mild symptoms consistent with alcohol withdrawal and has been started on a Librium taper   Assessment & Plan:   Assessment and Plan: * Wernicke encephalopathy syndrome- (present on admission) Patient with a history of alcohol use. MRI significant for mild increased T2 hyperintense signal in bilateral mamillary bodies. Patient managed on high-dose thiamine with improvement. Recommendations to transition to thiamine 200 mg for maintenance. PT/OT recommending SNF. -Transition to thiamine 200 mg PO daily today  Alcohol withdrawal (HCC) Appears to have mild symptoms. Required Ativan overnight. -Start Librium PO scheduled taper and Ativan prn  Elevated LFTs Likely related to alcohol use. CT abdomen/pelvis with evidence of fatty liver disease.  Malnutrition of moderate degree Dietitian recommendations: Ensure Plus High Protein po BID, each supplement provides 350 kcal and 20 grams of protein.  Dysphagia Chronic. Patient is followed by [facility] with plan for outpatient barium swallow. Speech therapy consulted this admission. Speech therapy recommendations (1/30): Dysphagia 3 (Mech soft);Thin liquid  Liquid Administration via: Cup;Straw Medication Administration: Whole meds with liquid Supervision: Staff to assist with self feeding;Full supervision/cueing for compensatory strategies Compensations: Slow rate;Small sips/bites Postural Changes: Seated upright at 90 degrees   Essential hypertension- (present on admission) Patient is on amlodipine as an outpatient which is held.  Currently normotensive.  Alcohol use- (present on admission) Noted. Patient does not plan to drink. Family plan on ensuring limited access to alcohol in the future.     DVT prophylaxis: Lovenox Code Status:   Code Status: Full Code Family Communication: Son at bedside Disposition Plan: Discharge to SNF in 24 hours pending completion of IV thiamine   Consultants:  Neurology  Procedures:  None  Antimicrobials: None    Subjective: No significant issues today but is drowsy since getting IV Ativan.  Objective: Vitals:   07/06/21 0426 07/06/21 0630 07/06/21 0909 07/06/21 1252  BP: 133/85 (!) 146/97 123/78   Pulse: (!) 113 (!) 118 (!) 108   Resp:  20 16   Temp: 98.2 F (36.8 C) 98 F (36.7 C) 98.1 F (36.7 C)   TempSrc: Oral Axillary    SpO2: 94% 95% 93% 98%  Weight:      Height:        Intake/Output Summary (Last 24 hours) at 07/06/2021 1338 Last data filed at 07/06/2021 1313 Gross per 24 hour  Intake 3097.27 ml  Output 3200 ml  Net -102.73 ml    Filed Weights   07/02/21 1933  Weight: 55 kg    Examination:  General exam: Appears calm and comfortable Respiratory system: Clear to auscultation. Respiratory effort normal. Cardiovascular system: S1 & S2 heard, mildly tachycardic with normal rhythm. Gastrointestinal system: Abdomen is nondistended, soft and nontender. No organomegaly or masses felt. Normal bowel sounds heard. Central nervous system: Drowsy but alert and oriented to person and place. No tremor noted. Musculoskeletal: No edema. No calf tenderness Skin: No cyanosis. No rashes Psychiatry: Judgement and insight appear impaired.    Data Reviewed: I have personally reviewed following labs and imaging studies  CBC Lab Results  Component Value Date   WBC 8.4 07/05/2021   RBC 3.56 (L) 07/05/2021  HGB 12.1 07/05/2021   HCT 35.2 (L) 07/05/2021   MCV 98.9 07/05/2021   MCH 34.0 07/05/2021   PLT 215 07/05/2021   MCHC 34.4 07/05/2021   RDW 19.2 (H)  07/05/2021   LYMPHSABS 2.6 07/02/2021   MONOABS 1.1 (H) 07/02/2021   EOSABS 0.3 07/02/2021   BASOSABS 0.2 (H) 16/03/9603     Last metabolic panel Lab Results  Component Value Date   NA 134 (L) 07/05/2021   K 4.2 07/05/2021   CL 110 07/05/2021   CO2 20 (L) 07/05/2021   BUN 5 (L) 07/05/2021   CREATININE 0.51 07/05/2021   GLUCOSE 73 07/05/2021   GFRNONAA >60 07/05/2021   GFRAA 103 12/23/2019   CALCIUM 7.7 (L) 07/05/2021   PHOS 4.6 07/14/2019   PROT 5.3 (L) 07/05/2021   ALBUMIN 1.8 (L) 07/05/2021   BILITOT 1.1 07/05/2021   ALKPHOS 90 07/05/2021   AST 50 (H) 07/05/2021   ALT 20 07/05/2021   ANIONGAP 4 (L) 07/05/2021    CBG (last 3)  No results for input(s): GLUCAP in the last 72 hours.   GFR: Estimated Creatinine Clearance: 51 mL/min (by C-G formula based on SCr of 0.51 mg/dL).  Coagulation Profile: No results for input(s): INR, PROTIME in the last 168 hours.  Recent Results (from the past 240 hour(s))  Urine Culture     Status: None   Collection Time: 06/28/21  4:02 PM   Specimen: Urine  Result Value Ref Range Status   MICRO NUMBER: [id number]  Final   SPECIMEN QUALITY: Adequate  Final   Sample Source NOT GIVEN  Final   STATUS: FINAL  Final   ISOLATE 1:   Final    Mixed genital flora isolated. These superficial bacteria are not indicative of a urinary tract infection. No further organism identification is warranted on this specimen. If clinically indicated, recollect clean-catch, mid-stream urine and transfer  immediately to Urine Culture Transport Tube.   Resp Panel by RT-PCR (Flu A&B, Covid) Nasopharyngeal Swab     Status: None   Collection Time: 07/02/21 10:50 PM   Specimen: Nasopharyngeal Swab; Nasopharyngeal(NP) swabs in vial transport medium  Result Value Ref Range Status   SARS Coronavirus 2 by RT PCR NEGATIVE NEGATIVE Final    Comment: (NOTE) SARS-CoV-2 target nucleic acids are NOT DETECTED.  The SARS-CoV-2 RNA is generally detectable in upper  respiratory specimens during the acute phase of infection. The lowest concentration of SARS-CoV-2 viral copies this assay can detect is 138 copies/mL. A negative result does not preclude SARS-Cov-2 infection and should not be used as the sole basis for treatment or other patient management decisions. A negative result may occur with  improper specimen collection/handling, submission of specimen other than nasopharyngeal swab, presence of viral mutation(s) within the areas targeted by this assay, and inadequate number of viral copies(<138 copies/mL). A negative result must be combined with clinical observations, patient history, and epidemiological information. The expected result is Negative.  Fact Sheet for Patients:  EntrepreneurPulse.com.au  Fact Sheet for Healthcare Providers:  IncredibleEmployment.be  This test is no t yet approved or cleared by the [place] FDA and  has been authorized for detection and/or diagnosis of SARS-CoV-2 by FDA under an Emergency Use Authorization (EUA). This EUA will remain  in effect (meaning this test can be used) for the duration of the COVID-19 declaration under Section 564(b)(1) of the Act, 21 U.S.C.section 360bbb-3(b)(1), unless the authorization is terminated  or revoked sooner.       Influenza A by  PCR NEGATIVE NEGATIVE Final   Influenza B by PCR NEGATIVE NEGATIVE Final    Comment: (NOTE) The Xpert Xpress SARS-CoV-2/FLU/RSV plus assay is intended as an aid in the diagnosis of influenza from Nasopharyngeal swab specimens and should not be used as a sole basis for treatment. Nasal washings and aspirates are unacceptable for Xpert Xpress SARS-CoV-2/FLU/RSV testing.  Fact Sheet for Patients: EntrepreneurPulse.com.au  Fact Sheet for Healthcare Providers: IncredibleEmployment.be  This test is not yet approved or cleared by the [place] FDA and has been  authorized for detection and/or diagnosis of SARS-CoV-2 by FDA under an Emergency Use Authorization (EUA). This EUA will remain in effect (meaning this test can be used) for the duration of the COVID-19 declaration under Section 564(b)(1) of the Act, 21 U.S.C. section 360bbb-3(b)(1), unless the authorization is terminated or revoked.  Performed at [facility], [place] [address] [place], [place] [address]   SARS CORONAVIRUS 2 (TAT 6-24 HRS) Nasopharyngeal Nasopharyngeal Swab     Status: None   Collection Time: 07/05/21  5:25 PM   Specimen: Nasopharyngeal Swab  Result Value Ref Range Status   SARS Coronavirus 2 NEGATIVE NEGATIVE Final    Comment: (NOTE) SARS-CoV-2 target nucleic acids are NOT DETECTED.  The SARS-CoV-2 RNA is generally detectable in upper and lower respiratory specimens during the acute phase of infection. Negative results do not preclude SARS-CoV-2 infection, do not rule out co-infections with other pathogens, and should not be used as the sole basis for treatment or other patient management decisions. Negative results must be combined with clinical observations, patient history, and epidemiological information. The expected result is Negative.  Fact Sheet for Patients: SugarRoll.be  Fact Sheet for Healthcare Providers: https://www.woods-mathews.com/  This test is not yet approved or cleared by the [place] FDA and  has been authorized for detection and/or diagnosis of SARS-CoV-2 by FDA under an Emergency Use Authorization (EUA). This EUA will remain  in effect (meaning this test can be used) for the duration of the COVID-19 declaration under Se ction 564(b)(1) of the Act, 21 U.S.C. section 360bbb-3(b)(1), unless the authorization is terminated or revoked sooner.  Performed at [facility], [place] [address] [place], [place] [address]         Radiology Studies: No results  found.      Scheduled Meds:  chlordiazePOXIDE  25 mg Oral QID   Followed by   Derrill Memo ON 07/07/2021] chlordiazePOXIDE  25 mg Oral TID   Followed by   Derrill Memo ON 07/08/2021] chlordiazePOXIDE  25 mg Oral BH-qamhs   Followed by   Derrill Memo ON 07/09/2021] chlordiazePOXIDE  25 mg Oral Daily   enoxaparin (LOVENOX) injection  40 mg Subcutaneous Q24H   feeding supplement  237 mL Oral BID BM   folic acid  1 mg Oral Daily   multivitamin with minerals  1 tablet Oral Daily   [START ON 07/07/2021] thiamine  200 mg Oral Daily   Continuous Infusions:     LOS: 4 days     Cordelia Poche, MD [facility] 07/06/2021, 1:38 PM  If 7PM-7AM, please contact night-coverage [email or web address]

## 2021-07-06 NOTE — Progress Notes (Signed)
Natasha Holland,  There were no concerning findings on your labs.  Your liver tests are stable and are consistent with cirrhosis secondary to alcohol use.  Your antibodies to Hepatitis A and B indicate that you have been vaccinated against these viruses.  You had mild elevation of 2 antibodies that are associated with autoimmune disorders of the liver.  Because the antibody titer was only very slightly elevated, I have low suspicion that these tests are diagnostic for any disease.  Please schedule the barium swallow that we discussed and contact us if you have any urgent questions about the labs.  I would like to see you in clinic for follow up after the barium to further discuss cirrhosis.

## 2021-07-06 NOTE — Progress Notes (Signed)
Speech Language Pathology Treatment: Dysphagia  Patient Details Name: Natasha Holland MRN: [id number] DOB: Oct 31, 1950 Today's Date: 07/06/2021 Time: 5456-2563 SLP Time Calculation (min) (ACUTE ONLY): 15 min  Assessment / Plan / Recommendation Clinical Impression  Patient seen by SLP to address dysphagia goals. When SLP arrived into room, patient in recliner and appeared to be sleeping. SLP was able to wake her up but she remained drowsy, lethargic throughout and required repeated cues to maintain adequate alertness. She was able to hold cup and drink several sips of thin liquids via straw sips without overt s/s aspiration or penetration. SLP plans to attempt to see patient at least one more time to ensure toleration of PO diet.    HPI HPI: Patient is 71 y.o. female presented to ED with concern for weakness and recurrent falls. PMH significant for ETOH abuse, HTN. Patient admitted for and memory loss concerning for Wernicke encephalopathy syndrome.      SLP Plan  Continue with current plan of care      Recommendations for follow up therapy are one component of a multi-disciplinary discharge planning process, led by the attending physician.  Recommendations may be updated based on patient status, additional functional criteria and insurance authorization.    Recommendations  Diet recommendations: Dysphagia 3 (mechanical soft);Thin liquid Medication Administration: Whole meds with liquid Supervision: Full supervision/cueing for compensatory strategies;Staff to assist with self feeding Compensations: Slow rate;Small sips/bites Postural Changes and/or Swallow Maneuvers: Seated upright 90 degrees                Oral Care Recommendations: Oral care BID;Staff/trained caregiver to provide oral care Follow Up Recommendations: Skilled nursing-short term rehab (<3 hours/day) Assistance recommended at discharge: Frequent or constant Supervision/Assistance SLP Visit Diagnosis: Dysphagia,  unspecified (R13.10) Plan: Continue with current plan of care         Sonia Baller, MA, CCC-SLP Speech Therapy

## 2021-07-06 NOTE — Progress Notes (Signed)
°   07/06/21 0426  Assess: MEWS Score  Temp 98.2 F (36.8 C)  BP 133/85  Pulse Rate (!) 113  Level of Consciousness Alert  SpO2 94 %  O2 Device Room Air  Assess: MEWS Score  MEWS Temp 0  MEWS Systolic 0  MEWS Pulse 2  MEWS RR 0  MEWS LOC 0  MEWS Score 2  MEWS Score Color Yellow  Assess: if the MEWS score is Yellow or Red  Were vital signs taken at a resting state? Yes  Focused Assessment Change from prior assessment (see assessment flowsheet)  Does the patient meet 2 or more of the SIRS criteria? Yes  Does the patient have a confirmed or suspected source of infection? No  MEWS guidelines implemented *See Row Information* Yes  Treat  MEWS Interventions Escalated (See documentation below)  Take Vital Signs  Increase Vital Sign Frequency  Yellow: Q 2hr X 2 then Q 4hr X 2, if remains yellow, continue Q 4hrs  Escalate  MEWS: Escalate Yellow: discuss with charge nurse/RN and consider discussing with provider and RRT  Notify: Charge Nurse/RN  Name of Charge Nurse/RN Notified D Hill  Date Charge Nurse/RN Notified 07/06/21  Time Charge Nurse/RN Notified 0459  Notify: Provider  Provider Name/Title Olena Heckle  Date Provider Notified 07/06/21  Time Provider Notified [phone]  Notification Type Page  Notification Reason Change in status  Provider response See new orders  Date of Provider Response 07/06/21  Time of Provider Response 0459  Document  Patient Outcome Other (Comment) (unchanged)  Progress note created (see row info) Yes  Assess: SIRS CRITERIA  SIRS Temperature  0  SIRS Pulse 1  SIRS Respirations  0  SIRS WBC 0  SIRS Score Sum  1

## 2021-07-06 NOTE — Plan of Care (Signed)

## 2021-07-06 NOTE — Assessment & Plan Note (Addendum)
Patient developed mild symptoms that have now resolved. Completed Librium PO scheduled taper and Ativan prn per [facility].

## 2021-07-06 NOTE — Progress Notes (Signed)
Physical Therapy Treatment Patient Details Name: Natasha Holland MRN: [id number] DOB: [birth year] Today's Date: 07/06/2021   History of Present Illness Patient is 71 y.o. female presented to ED with concern for weakness and recurrent falls. PMH significant for ETOH abuse, HTN. Patient admitted for and memory loss concerning for Wernicke encephalopathy syndrome.    PT Comments    Patient making slow progress with mobility and remains +2 Mod assist for functional transfers today. Patient stood with Mod assist and RW at EOB for pericare assist from NT due to bed linens soiled with stool/urine. Pt pivoted to recliner and multimodal cues provided to facilitate pt scooting hips back in chair. Pt cues to bring forehead to each hand and Mod assist provided to scoot contralateral hip back. 2+ to fully reposition for safety. EOS pt resting in recliner with NT in room. Recommendations updated to SNF with 24/7 assist as pt continues to require 2+ assist for mobility.       Recommendations for follow up therapy are one component of a multi-disciplinary discharge planning process, led by the attending physician.  Recommendations may be updated based on patient status, additional functional criteria and insurance authorization.  Follow Up Recommendations  Skilled nursing-short term rehab (<3 hours/day)     Assistance Recommended at Discharge Frequent or constant Supervision/Assistance  Patient can return home with the following Two people to help with walking and/or transfers;Two people to help with bathing/dressing/bathroom;Direct supervision/assist for medications management;Assistance with feeding;Assistance with cooking/housework;Assist for transportation;Direct supervision/assist for financial management;Help with stairs or ramp for entrance   Equipment Recommendations  Rolling walker (2 wheels)    Recommendations for Other Services       Precautions / Restrictions Precautions Precautions:  Fall Restrictions Weight Bearing Restrictions: No Other Position/Activity Restrictions: likes to be called "Natasha Holland"     Mobility  Bed Mobility Overal bed mobility: Needs Assistance Bed Mobility: Supine to Sit     Supine to sit: Mod assist, HOB elevated     General bed mobility comments: Mod assist with cues for hand placement to use bed rail for trunk rotation and to press up to EOB. pt with poor seated balance and required assist with bed pad to scoot to edge and place feet on floor.    Transfers Overall transfer level: Needs assistance Equipment used: Rolling walker (2 wheels) Transfers: Sit to/from Stand, Bed to chair/wheelchair/BSC Sit to Stand: Mod assist, +2 safety/equipment, +2 physical assistance Stand pivot transfers: Mod assist, +2 physical assistance, +2 safety/equipment         General transfer comment: Mod +2 with manual blocking of pt's feet to prevent anterior slide due to posterior lean with rise. +2 for physical stability and assist to pivot and manage walker with turn to recliner.    Ambulation/Gait                   Stairs             Wheelchair Mobility    Modified Rankin (Stroke Patients Only)       Balance Overall balance assessment: Needs assistance Sitting-balance support: Feet supported, Bilateral upper extremity supported Sitting balance-Leahy Scale: Fair   Postural control: Posterior lean Standing balance support: During functional activity, Reliant on assistive device for balance, Bilateral upper extremity supported Standing balance-Leahy Scale: Poor                              Cognition Arousal/Alertness: Awake/alert Behavior During  Therapy: WFL for tasks assessed/performed Overall Cognitive Status: No family/caregiver present to determine baseline cognitive functioning Area of Impairment: Following commands, Safety/judgement, Problem solving, Awareness                       Following  Commands: Follows one step commands inconsistently, Follows one step commands with increased time Safety/Judgement: Decreased awareness of safety, Decreased awareness of deficits Awareness: Intellectual Problem Solving: Decreased initiation, Difficulty sequencing, Requires verbal cues, Requires tactile cues General Comments: pt  has poor insight and impaired processing for sequencing and following commands        Exercises      General Comments        Pertinent Vitals/Pain Pain Assessment Pain Assessment: No/denies pain    Home Living                          Prior Function            PT Goals (current goals can now be found in the care plan section) Acute Rehab PT Goals PT Goal Formulation: With patient Time For Goal Achievement: 07/17/21 Potential to Achieve Goals: Good Progress towards PT goals: Progressing toward goals    Frequency    Min 3X/week      PT Plan Discharge plan needs to be updated    Co-evaluation              AM-PAC PT "6 Clicks" Mobility   Outcome Measure  Help needed turning from your back to your side while in a flat bed without using bedrails?: A Little Help needed moving from lying on your back to sitting on the side of a flat bed without using bedrails?: A Lot Help needed moving to and from a bed to a chair (including a wheelchair)?: Total Help needed standing up from a chair using your arms (e.g., wheelchair or bedside chair)?: Total Help needed to walk in hospital room?: Total Help needed climbing 3-5 steps with a railing? : Total 6 Click Score: 9    End of Session Equipment Utilized During Treatment: Gait belt Activity Tolerance: Patient tolerated treatment well Patient left: in chair;with call bell/phone within reach;with chair alarm set;with nursing/sitter in room Nurse Communication: Mobility status PT Visit Diagnosis: Muscle weakness (generalized) (M62.81);Difficulty in walking, not elsewhere classified  (R26.2);Unsteadiness on feet (R26.81)     Time: 0867-6195 PT Time Calculation (min) (ACUTE ONLY): 18 min  Charges:  $Therapeutic Activity: 8-22 mins                     Verner Mould, DPT Acute Rehabilitation Services Office [phone] Pager [phone]    Jacques Navy 07/06/2021, 1:51 PM

## 2021-07-07 DIAGNOSIS — R000 Tachycardia, unspecified: Secondary | ICD-10-CM | POA: Insufficient documentation

## 2021-07-07 LAB — CBC
HCT: 37.1 % (ref 36.0–46.0)
Hemoglobin: 12.9 g/dL (ref 12.0–15.0)
MCH: 33.7 pg (ref 26.0–34.0)
MCHC: 34.8 g/dL (ref 30.0–36.0)
MCV: 96.9 fL (ref 80.0–100.0)
Platelets: 210 10*3/uL (ref 150–400)
RBC: 3.83 MIL/uL — ABNORMAL LOW (ref 3.87–5.11)
RDW: 18.9 % — ABNORMAL HIGH (ref 11.5–15.5)
WBC: 7.7 10*3/uL (ref 4.0–10.5)
nRBC: 0.3 % — ABNORMAL HIGH (ref 0.0–0.2)

## 2021-07-07 MED ORDER — SODIUM CHLORIDE 0.9 % IV BOLUS
500.0000 mL | Freq: Once | INTRAVENOUS | Status: AC
  Administered 2021-07-07: 500 mL via INTRAVENOUS

## 2021-07-07 NOTE — Progress Notes (Signed)
PROGRESS NOTE    Shernita Rabinovich  XNT:[id number] DOB: Sep 07, 1950 DOA: 07/02/2021 PCP: Billie Ruddy, MD   Brief Narrative: Emer Onnen is a 71 y.o. female with a history of alcohol use and hypertension presented secondary to weakness and recurrent falls. Concern on admission for wernicke's encephalopathy. Patient managed on high dose thiamine with improvement. PT/OT recommending SNF. Patient developed mild symptoms consistent with alcohol withdrawal and has been started on a Librium taper. Patient also developed an elevated temperature with associated tachycardia and soft blood pressure. Blood cultures obtained but no source for possible infection.   Assessment and Plan: * Wernicke encephalopathy syndrome- (present on admission) Patient with a history of alcohol use. MRI significant for mild increased T2 hyperintense signal in bilateral mamillary bodies. Patient managed on high-dose thiamine with improvement. Recommendations to transition to thiamine 200 mg for maintenance. PT/OT recommending SNF. -Continue thiamine 200 mg PO daily  Tachycardia Possibly related to alcohol withdrawal. Patient with soft blood pressure today. Previously with elevated temperatures. No infectious source. Chest x-ray unremarkable. Blood cultures obtained. No leukocytosis. -Follow-up blood cultures. -NS bolus and trend BPs  Alcohol withdrawal (HCC) Appears to have mild symptoms that have now resolved. -Continue Librium PO scheduled taper and Ativan prn  Malnutrition of moderate degree Dietitian recommendations: Ensure Plus High Protein po BID, each supplement provides 350 kcal and 20 grams of protein.  Alcohol use- (present on admission) Noted. Patient does not plan to drink. Family plan on ensuring limited access to alcohol in the future.  Elevated LFTs Likely related to alcohol use. CT abdomen/pelvis with evidence of fatty liver disease.  Dysphagia Chronic. Patient is followed by [facility] with  plan for outpatient barium swallow. Speech therapy consulted this admission. Speech therapy recommendations (1/30): Dysphagia 3 (Mech soft);Thin liquid  Liquid Administration via: Cup;Straw Medication Administration: Whole meds with liquid Supervision: Staff to assist with self feeding;Full supervision/cueing for compensatory strategies Compensations: Slow rate;Small sips/bites Postural Changes: Seated upright at 90 degrees   Essential hypertension- (present on admission) Patient is on amlodipine as an outpatient which is held. Initially normotensive but now with soft blood pressure -Continue to hold outpatient antihypertensive medication     DVT prophylaxis: Lovenox Code Status:   Code Status: Full Code Family Communication: None at bedside Disposition Plan: Discharge to SNF in 3 days pending stable vitals and infection workup.  Consultants:  Neurology  Procedures:  None  Antimicrobials: None    Subjective: No concerns today.  Objective: Vitals:   07/06/21 2243 07/07/21 0340 07/07/21 0631 07/07/21 1227  BP: 95/68 102/69 97/73 (!) 86/64  Pulse: (!) 102 (!) 103 (!) 102 (!) 122  Resp: 18 19 18 16   Temp: 97.9 F (36.6 C) (!) 97.5 F (36.4 C) 98.5 F (36.9 C) 99.1 F (37.3 C)  TempSrc:   Oral Oral  SpO2: 91% 93% 91% 92%  Weight:      Height:        Intake/Output Summary (Last 24 hours) at 07/07/2021 1425 Last data filed at 07/06/2021 2100 Gross per 24 hour  Intake 120 ml  Output --  Net 120 ml    Filed Weights   07/02/21 1933  Weight: 55 kg    Examination:  General exam: Appears calm and comfortable Respiratory system: Clear to auscultation. Respiratory effort normal. Cardiovascular system: S1 & S2 heard, tachycardia with normal rhythm. No murmurs, rubs, gallops or clicks. Gastrointestinal system: Abdomen is nondistended, soft and nontender. No organomegaly or masses felt. Normal bowel sounds heard.  Central nervous system: Alert and oriented to person and  place. No focal neurological deficits. No tremors. Musculoskeletal: No edema. No calf tenderness Skin: No cyanosis. No rashes Psychiatry: Judgement and insight appear normal. Blunt affect    Data Reviewed: I have personally reviewed following labs and imaging studies  CBC Lab Results  Component Value Date   WBC 7.7 07/07/2021   RBC 3.83 (L) 07/07/2021   HGB 12.9 07/07/2021   HCT 37.1 07/07/2021   MCV 96.9 07/07/2021   MCH 33.7 07/07/2021   PLT 210 07/07/2021   MCHC 34.8 07/07/2021   RDW 18.9 (H) 07/07/2021   LYMPHSABS 2.6 07/02/2021   MONOABS 1.1 (H) 07/02/2021   EOSABS 0.3 07/02/2021   BASOSABS 0.2 (H) 62/94/7654     Last metabolic panel Lab Results  Component Value Date   NA 134 (L) 07/05/2021   K 4.2 07/05/2021   CL 110 07/05/2021   CO2 20 (L) 07/05/2021   BUN 5 (L) 07/05/2021   CREATININE 0.51 07/05/2021   GLUCOSE 73 07/05/2021   GFRNONAA >60 07/05/2021   GFRAA 103 12/23/2019   CALCIUM 7.7 (L) 07/05/2021   PHOS 4.6 07/14/2019   PROT 5.3 (L) 07/05/2021   ALBUMIN 1.8 (L) 07/05/2021   BILITOT 1.1 07/05/2021   ALKPHOS 90 07/05/2021   AST 50 (H) 07/05/2021   ALT 20 07/05/2021   ANIONGAP 4 (L) 07/05/2021    CBG (last 3)  No results for input(s): GLUCAP in the last 72 hours.   GFR: Estimated Creatinine Clearance: 51 mL/min (by C-G formula based on SCr of 0.51 mg/dL).  Coagulation Profile: No results for input(s): INR, PROTIME in the last 168 hours.  Recent Results (from the past 240 hour(s))  Urine Culture     Status: None   Collection Time: 06/28/21  4:02 PM   Specimen: Urine  Result Value Ref Range Status   MICRO NUMBER: [id number]  Final   SPECIMEN QUALITY: Adequate  Final   Sample Source NOT GIVEN  Final   STATUS: FINAL  Final   ISOLATE 1:   Final    Mixed genital flora isolated. These superficial bacteria are not indicative of a urinary tract infection. No further organism identification is warranted on this specimen. If clinically indicated,  recollect clean-catch, mid-stream urine and transfer  immediately to Urine Culture Transport Tube.   Resp Panel by RT-PCR (Flu A&B, Covid) Nasopharyngeal Swab     Status: None   Collection Time: 07/02/21 10:50 PM   Specimen: Nasopharyngeal Swab; Nasopharyngeal(NP) swabs in vial transport medium  Result Value Ref Range Status   SARS Coronavirus 2 by RT PCR NEGATIVE NEGATIVE Final    Comment: (NOTE) SARS-CoV-2 target nucleic acids are NOT DETECTED.  The SARS-CoV-2 RNA is generally detectable in upper respiratory specimens during the acute phase of infection. The lowest concentration of SARS-CoV-2 viral copies this assay can detect is 138 copies/mL. A negative result does not preclude SARS-Cov-2 infection and should not be used as the sole basis for treatment or other patient management decisions. A negative result may occur with  improper specimen collection/handling, submission of specimen other than nasopharyngeal swab, presence of viral mutation(s) within the areas targeted by this assay, and inadequate number of viral copies(<138 copies/mL). A negative result must be combined with clinical observations, patient history, and epidemiological information. The expected result is Negative.  Fact Sheet for Patients:  EntrepreneurPulse.com.au  Fact Sheet for Healthcare Providers:  IncredibleEmployment.be  This test is no t yet approved or cleared by the  [place] Architectural technologist and  has been authorized for detection and/or diagnosis of SARS-CoV-2 by FDA under an Print production planner (EUA). This EUA will remain  in effect (meaning this test can be used) for the duration of the COVID-19 declaration under Section 564(b)(1) of the Act, 21 U.S.C.section 360bbb-3(b)(1), unless the authorization is terminated  or revoked sooner.       Influenza A by PCR NEGATIVE NEGATIVE Final   Influenza B by PCR NEGATIVE NEGATIVE Final    Comment: (NOTE) The Xpert  Xpress SARS-CoV-2/FLU/RSV plus assay is intended as an aid in the diagnosis of influenza from Nasopharyngeal swab specimens and should not be used as a sole basis for treatment. Nasal washings and aspirates are unacceptable for Xpert Xpress SARS-CoV-2/FLU/RSV testing.  Fact Sheet for Patients: EntrepreneurPulse.com.au  Fact Sheet for Healthcare Providers: IncredibleEmployment.be  This test is not yet approved or cleared by the [place] FDA and has been authorized for detection and/or diagnosis of SARS-CoV-2 by FDA under an Emergency Use Authorization (EUA). This EUA will remain in effect (meaning this test can be used) for the duration of the COVID-19 declaration under Section 564(b)(1) of the Act, 21 U.S.C. section 360bbb-3(b)(1), unless the authorization is terminated or revoked.  Performed at [facility], [place] [address] [place], [place] [address]   SARS CORONAVIRUS 2 (TAT 6-24 HRS) Nasopharyngeal Nasopharyngeal Swab     Status: None   Collection Time: 07/05/21  5:25 PM   Specimen: Nasopharyngeal Swab  Result Value Ref Range Status   SARS Coronavirus 2 NEGATIVE NEGATIVE Final    Comment: (NOTE) SARS-CoV-2 target nucleic acids are NOT DETECTED.  The SARS-CoV-2 RNA is generally detectable in upper and lower respiratory specimens during the acute phase of infection. Negative results do not preclude SARS-CoV-2 infection, do not rule out co-infections with other pathogens, and should not be used as the sole basis for treatment or other patient management decisions. Negative results must be combined with clinical observations, patient history, and epidemiological information. The expected result is Negative.  Fact Sheet for Patients: SugarRoll.be  Fact Sheet for Healthcare Providers: https://www.woods-mathews.com/  This test is not yet approved or cleared by the [place]  FDA and  has been authorized for detection and/or diagnosis of SARS-CoV-2 by FDA under an Emergency Use Authorization (EUA). This EUA will remain  in effect (meaning this test can be used) for the duration of the COVID-19 declaration under Se ction 564(b)(1) of the Act, 21 U.S.C. section 360bbb-3(b)(1), unless the authorization is terminated or revoked sooner.  Performed at [facility], [place] [address], [place], [place] [address]   Culture, blood (routine x 2)     Status: None (Preliminary result)   Collection Time: 07/06/21  7:21 PM   Specimen: Right Antecubital; Blood  Result Value Ref Range Status   Specimen Description   Final    RIGHT ANTECUBITAL Performed at [facility] [address], [place], [place] [address]    Special Requests   Final    BOTTLES DRAWN AEROBIC ONLY Blood Culture results may not be optimal due to an inadequate volume of blood received in culture bottles Performed at [facility] [address] [place], [place] [address]    Culture   Final    NO GROWTH < 12 HOURS Performed at [facility] [address] [place], [place] [address]    Report Status PENDING  Incomplete  Culture, blood (routine x 2)  Status: None (Preliminary result)   Collection Time: 07/06/21  7:21 PM   Specimen: BLOOD RIGHT HAND  Result Value Ref Range Status   Specimen Description   Final    BLOOD RIGHT HAND Performed at [facility] [address], [place], [place] [address]    Special Requests   Final    BOTTLES DRAWN AEROBIC ONLY Blood Culture adequate volume Performed at [facility] [address], [place], [place] [address]    Culture   Final    NO GROWTH < 12 HOURS Performed at [facility] [address], [place], [place] [address]    Report Status PENDING  Incomplete        Radiology Studies: DG CHEST PORT 1 VIEW  Result Date: 07/06/2021 CLINICAL DATA:   Tachypnea EXAM: PORTABLE CHEST 1 VIEW COMPARISON:  07/02/2021 FINDINGS: Single frontal view of the chest demonstrates a stable cardiac silhouette. Ectasia of the thoracic aorta. No acute airspace disease, effusion, or pneumothorax. No acute bony abnormalities. IMPRESSION: 1. Stable chest, no acute process. Electronically Signed   By: Randa Ngo M.D.   On: 07/06/2021 21:05        Scheduled Meds:  chlordiazePOXIDE  25 mg Oral TID   Followed by   Derrill Memo ON 07/08/2021] chlordiazePOXIDE  25 mg Oral BH-qamhs   Followed by   Derrill Memo ON 07/09/2021] chlordiazePOXIDE  25 mg Oral Daily   enoxaparin (LOVENOX) injection  40 mg Subcutaneous Q24H   feeding supplement  237 mL Oral BID BM   folic acid  1 mg Oral Daily   multivitamin with minerals  1 tablet Oral Daily   thiamine  200 mg Oral Daily   Continuous Infusions:     LOS: 5 days     Cordelia Poche, MD [facility] 07/07/2021, 2:25 PM  If 7PM-7AM, please contact night-coverage [email or web address]

## 2021-07-07 NOTE — TOC Progression Note (Signed)
Transition of Care [facility]) - Progression Note    Patient Details  Name: Natasha Holland MRN: [id number] Date of Birth: [birth year]  Transition of Care [facility]) CM/SW [place], [place] Phone Number: 07/07/2021, 1:26 PM  Clinical Narrative:   No d/c today.  Auth good through Monday-days granted were 2/2 thru 2/6.  Navi reference # [id number]. TOC will continue to follow during the course of hospitalization.     Expected Discharge Plan: Skilled Nursing Facility Barriers to Discharge: Other (must enter comment) (insurance auth)  Expected Discharge Plan and Services Expected Discharge Plan: [place]   Discharge Planning Services: CM Consult Post Acute Care Choice: [place] Living arrangements for the past 2 months: Apartment                                       Social Determinants of Health (SDOH) Interventions    Readmission Risk Interventions No flowsheet data found.

## 2021-07-07 NOTE — Assessment & Plan Note (Addendum)
Possibly related to alcohol withdrawal. Patient with soft blood pressure that responded appropriately to IV fluids. Previously with elevated temperatures but no true fever. No infectious source. Chest x-ray unremarkable. Blood cultures obtained with no growth to date. No leukocytosis. D-dimer of 0.61 which is normal when corrected for age, and patient's Wells score indicates low risk. No associated chest pain or dyspnea. Hypoxia can be explained by patient's underlying emphysema. Upon chart review, it appears patient has chronic sinus tachycardia. Will discharge on low-dose metoprolol and recommend outpatient cardiology follow-up.

## 2021-07-07 NOTE — Progress Notes (Signed)
°   07/07/21 0715  Assess: MEWS Score  Level of Consciousness Alert  Assess: MEWS Score  MEWS Temp 0  MEWS Systolic 1  MEWS Pulse 1  MEWS RR 0  MEWS LOC 0  MEWS Score 2  MEWS Score Color Yellow  Assess: if the MEWS score is Yellow or Red  Were vital signs taken at a resting state? Yes  Focused Assessment No change from prior assessment  Does the patient meet 2 or more of the SIRS criteria? No  Does the patient have a confirmed or suspected source of infection? No  MEWS guidelines implemented *See Row Information* Yes  Treat  Pain Scale 0-10  Pain Score 0  Take Vital Signs  Increase Vital Sign Frequency  Yellow: Q 2hr X 2 then Q 4hr X 2, if remains yellow, continue Q 4hrs  Escalate  MEWS: Escalate Yellow: discuss with charge nurse/RN and consider discussing with provider and RRT  Notify: Charge Nurse/RN  Name of Charge Nurse/RN Notified Pamala Hurry, RN  Date Charge Nurse/RN Notified 07/07/21  Time Charge Nurse/RN Notified 0720  Document  Patient Outcome Other (Comment) (Unchanged)  Progress note created (see row info) Yes

## 2021-07-07 NOTE — Progress Notes (Signed)
°   07/07/21 0715 07/07/21 1227  Assess: MEWS Score  Temp  --  99.1 F (37.3 C)  BP  --  (!) 86/64  Pulse Rate  --  (!) 122  Resp  --  16  SpO2  --  92 %  O2 Device  --  Room Air  Assess: MEWS Score  MEWS Temp 0 0  MEWS Systolic 1 1  MEWS Pulse 1 2  MEWS RR 0 0  MEWS LOC 0 0  MEWS Score 2 3  MEWS Score Color Yellow Yellow  Assess: if the MEWS score is Yellow or Red  Were vital signs taken at a resting state?  --  Yes  Focused Assessment  --  No change from prior assessment  Does the patient meet 2 or more of the SIRS criteria?  --  No  Does the patient have a confirmed or suspected source of infection?  --  No  MEWS guidelines implemented *See Row Information*  --  Yes  Treat  MEWS Interventions  --  Administered scheduled meds/treatments;Other (Comment) (gave bolus)  Pain Scale  --  0-10  Pain Score  --  0  Take Vital Signs  Increase Vital Sign Frequency   --  Yellow: Q 2hr X 2 then Q 4hr X 2, if remains yellow, continue Q 4hrs  Escalate  MEWS: Escalate  --  Yellow: discuss with charge nurse/RN and consider discussing with provider and RRT  Notify: Charge Nurse/RN  Date Charge Nurse/RN Notified  --  07/07/21  Time Charge Nurse/RN Notified  --  1240  Document  Patient Outcome  --  Other (Comment)  Progress note created (see row info)  --  Yes  Assess: SIRS CRITERIA  SIRS Temperature   --  0  SIRS Pulse  --  1  SIRS Respirations   --  0  SIRS WBC  --  0  SIRS Score Sum   --  1

## 2021-07-08 LAB — COMPREHENSIVE METABOLIC PANEL
ALT: 20 U/L (ref 0–44)
AST: 45 U/L — ABNORMAL HIGH (ref 15–41)
Albumin: 1.8 g/dL — ABNORMAL LOW (ref 3.5–5.0)
Alkaline Phosphatase: 105 U/L (ref 38–126)
Anion gap: 4 — ABNORMAL LOW (ref 5–15)
BUN: 8 mg/dL (ref 8–23)
CO2: 25 mmol/L (ref 22–32)
Calcium: 8.1 mg/dL — ABNORMAL LOW (ref 8.9–10.3)
Chloride: 105 mmol/L (ref 98–111)
Creatinine, Ser: 0.44 mg/dL (ref 0.44–1.00)
GFR, Estimated: >60 mL/min (ref >60)
Glucose, Bld: 113 mg/dL — ABNORMAL HIGH (ref 70–99)
Potassium: 3.5 mmol/L (ref 3.5–5.1)
Sodium: 134 mmol/L — ABNORMAL LOW (ref 135–145)
Total Bilirubin: 0.7 mg/dL (ref 0.3–1.2)
Total Protein: 5.6 g/dL — ABNORMAL LOW (ref 6.5–8.1)

## 2021-07-08 LAB — MAGNESIUM: Magnesium: 1.8 mg/dL (ref 1.7–2.4)

## 2021-07-08 NOTE — Plan of Care (Signed)

## 2021-07-08 NOTE — Progress Notes (Signed)
PROGRESS NOTE    Natasha Holland  YKD:[id number] DOB: May 10, 1951 DOA: 07/02/2021 PCP: Billie Ruddy, MD   Brief Narrative: Natasha Holland is a 71 y.o. female with a history of alcohol use and hypertension presented secondary to weakness and recurrent falls. Concern on admission for wernicke's encephalopathy. Patient managed on high dose thiamine with improvement. PT/OT recommending SNF. Patient developed mild symptoms consistent with alcohol withdrawal and has been started on a Librium taper. Patient also developed an elevated temperature with associated tachycardia and soft blood pressure. Blood cultures obtained but no source for possible infection. Workup is so far negative.   Assessment and Plan: * Wernicke encephalopathy syndrome- (present on admission) Patient with a history of alcohol use. MRI significant for mild increased T2 hyperintense signal in bilateral mamillary bodies. Patient managed on high-dose thiamine with improvement. Recommendations to transition to thiamine 200 mg for maintenance. PT/OT recommending SNF. -Continue thiamine 200 mg PO daily  Tachycardia Possibly related to alcohol withdrawal. Patient with soft blood pressure that responded appropriately to IV fluids. Previously with elevated temperatures but no true fever. No infectious source. Chest x-ray unremarkable. Blood cultures obtained with no growth to date. No leukocytosis.  Alcohol withdrawal ([place]) Patient developed mild symptoms that have now resolved. -Continue Librium PO scheduled taper and Ativan prn  Malnutrition of moderate degree Dietitian recommendations: Ensure Plus High Protein po BID, each supplement provides 350 kcal and 20 grams of protein.  Alcohol use- (present on admission) Noted. Patient does not plan to drink. Family plan on ensuring limited access to alcohol in the future.  Elevated LFTs Likely related to alcohol use. CT abdomen/pelvis with evidence of fatty liver  disease.  Dysphagia Chronic. Patient is followed by [facility] with plan for outpatient barium swallow. Speech therapy consulted this admission. Speech therapy recommendations (1/30): Dysphagia 3 (Mech soft);Thin liquid  Liquid Administration via: Cup;Straw Medication Administration: Whole meds with liquid Supervision: Staff to assist with self feeding;Full supervision/cueing for compensatory strategies Compensations: Slow rate;Small sips/bites Postural Changes: Seated upright at 90 degrees   Essential hypertension- (present on admission) Patient is on amlodipine as an outpatient which is held. Patient developed soft blood pressure that have responded well to IV fluids. -Continue to hold outpatient antihypertensive medication until blood pressure is stable     DVT prophylaxis: Lovenox Code Status:   Code Status: Full Code Family Communication: None at bedside Disposition Plan: Discharge to SNF in 2 days pending stable vitals  Consultants:  Neurology  Procedures:  None  Antimicrobials: None    Subjective: No issues this morning  Objective: Vitals:   07/07/21 1227 07/07/21 2034 07/08/21 0014 07/08/21 0544  BP: (!) 86/64 93/67 101/70 114/81  Pulse: (!) 122 99 (!) 110 (!) 108  Resp: 16 16 16 20   Temp: 99.1 F (37.3 C) 99.1 F (37.3 C) 99.5 F (37.5 C) 98.5 F (36.9 C)  TempSrc: Oral Oral Oral Oral  SpO2: 92% 90% 92% 97%  Weight:      Height:        Intake/Output Summary (Last 24 hours) at 07/08/2021 1121 Last data filed at 07/08/2021 0900 Gross per 24 hour  Intake 480 ml  Output 0 ml  Net 480 ml   Filed Weights   07/02/21 1933  Weight: 55 kg    Examination:  General exam: Appears calm and comfortable Respiratory system: Clear to auscultation. Respiratory effort normal. Cardiovascular system: S1 & S2 heard, slightly tachycardic. Normal rhythm. No murmurs, rubs, gallops or clicks. Gastrointestinal system:  Abdomen is nondistended, soft and nontender. No  organomegaly or masses felt. Normal bowel sounds heard. Central nervous system: Alert   Data Reviewed: I have personally reviewed following labs and imaging studies  CBC Lab Results  Component Value Date   WBC 7.7 07/07/2021   RBC 3.83 (L) 07/07/2021   HGB 12.9 07/07/2021   HCT 37.1 07/07/2021   MCV 96.9 07/07/2021   MCH 33.7 07/07/2021   PLT 210 07/07/2021   MCHC 34.8 07/07/2021   RDW 18.9 (H) 07/07/2021   LYMPHSABS 2.6 07/02/2021   MONOABS 1.1 (H) 07/02/2021   EOSABS 0.3 07/02/2021   BASOSABS 0.2 (H) 57/84/6962     Last metabolic panel Lab Results  Component Value Date   NA 134 (L) 07/05/2021   K 4.2 07/05/2021   CL 110 07/05/2021   CO2 20 (L) 07/05/2021   BUN 5 (L) 07/05/2021   CREATININE 0.51 07/05/2021   GLUCOSE 73 07/05/2021   GFRNONAA >60 07/05/2021   GFRAA 103 12/23/2019   CALCIUM 7.7 (L) 07/05/2021   PHOS 4.6 07/14/2019   PROT 5.3 (L) 07/05/2021   ALBUMIN 1.8 (L) 07/05/2021   BILITOT 1.1 07/05/2021   ALKPHOS 90 07/05/2021   AST 50 (H) 07/05/2021   ALT 20 07/05/2021   ANIONGAP 4 (L) 07/05/2021    CBG (last 3)  No results for input(s): GLUCAP in the last 72 hours.   GFR: Estimated Creatinine Clearance: 51 mL/min (by C-G formula based on SCr of 0.51 mg/dL).  Coagulation Profile: No results for input(s): INR, PROTIME in the last 168 hours.  Recent Results (from the past 240 hour(s))  Urine Culture     Status: None   Collection Time: 06/28/21  4:02 PM   Specimen: Urine  Result Value Ref Range Status   MICRO NUMBER: [id number]  Final   SPECIMEN QUALITY: Adequate  Final   Sample Source NOT GIVEN  Final   STATUS: FINAL  Final   ISOLATE 1:   Final    Mixed genital flora isolated. These superficial bacteria are not indicative of a urinary tract infection. No further organism identification is warranted on this specimen. If clinically indicated, recollect clean-catch, mid-stream urine and transfer  immediately to Urine Culture Transport Tube.   Resp  Panel by RT-PCR (Flu A&B, Covid) Nasopharyngeal Swab     Status: None   Collection Time: 07/02/21 10:50 PM   Specimen: Nasopharyngeal Swab; Nasopharyngeal(NP) swabs in vial transport medium  Result Value Ref Range Status   SARS Coronavirus 2 by RT PCR NEGATIVE NEGATIVE Final    Comment: (NOTE) SARS-CoV-2 target nucleic acids are NOT DETECTED.  The SARS-CoV-2 RNA is generally detectable in upper respiratory specimens during the acute phase of infection. The lowest concentration of SARS-CoV-2 viral copies this assay can detect is 138 copies/mL. A negative result does not preclude SARS-Cov-2 infection and should not be used as the sole basis for treatment or other patient management decisions. A negative result may occur with  improper specimen collection/handling, submission of specimen other than nasopharyngeal swab, presence of viral mutation(s) within the areas targeted by this assay, and inadequate number of viral copies(<138 copies/mL). A negative result must be combined with clinical observations, patient history, and epidemiological information. The expected result is Negative.  Fact Sheet for Patients:  EntrepreneurPulse.com.au  Fact Sheet for Healthcare Providers:  IncredibleEmployment.be  This test is no t yet approved or cleared by the [place] FDA and  has been authorized for detection and/or diagnosis of SARS-CoV-2 by FDA under  an Emergency Use Authorization (EUA). This EUA will remain  in effect (meaning this test can be used) for the duration of the COVID-19 declaration under Section 564(b)(1) of the Act, 21 U.S.C.section 360bbb-3(b)(1), unless the authorization is terminated  or revoked sooner.       Influenza A by PCR NEGATIVE NEGATIVE Final   Influenza B by PCR NEGATIVE NEGATIVE Final    Comment: (NOTE) The Xpert Xpress SARS-CoV-2/FLU/RSV plus assay is intended as an aid in the diagnosis of influenza from Nasopharyngeal  swab specimens and should not be used as a sole basis for treatment. Nasal washings and aspirates are unacceptable for Xpert Xpress SARS-CoV-2/FLU/RSV testing.  Fact Sheet for Patients: EntrepreneurPulse.com.au  Fact Sheet for Healthcare Providers: IncredibleEmployment.be  This test is not yet approved or cleared by the [place] FDA and has been authorized for detection and/or diagnosis of SARS-CoV-2 by FDA under an Emergency Use Authorization (EUA). This EUA will remain in effect (meaning this test can be used) for the duration of the COVID-19 declaration under Section 564(b)(1) of the Act, 21 U.S.C. section 360bbb-3(b)(1), unless the authorization is terminated or revoked.  Performed at [facility], [place] [address], [place], [place] [address]   SARS CORONAVIRUS 2 (TAT 6-24 HRS) Nasopharyngeal Nasopharyngeal Swab     Status: None   Collection Time: 07/05/21  5:25 PM   Specimen: Nasopharyngeal Swab  Result Value Ref Range Status   SARS Coronavirus 2 NEGATIVE NEGATIVE Final    Comment: (NOTE) SARS-CoV-2 target nucleic acids are NOT DETECTED.  The SARS-CoV-2 RNA is generally detectable in upper and lower respiratory specimens during the acute phase of infection. Negative results do not preclude SARS-CoV-2 infection, do not rule out co-infections with other pathogens, and should not be used as the sole basis for treatment or other patient management decisions. Negative results must be combined with clinical observations, patient history, and epidemiological information. The expected result is Negative.  Fact Sheet for Patients: SugarRoll.be  Fact Sheet for Healthcare Providers: https://www.woods-mathews.com/  This test is not yet approved or cleared by the [place] FDA and  has been authorized for detection and/or diagnosis of SARS-CoV-2 by FDA under an Emergency Use  Authorization (EUA). This EUA will remain  in effect (meaning this test can be used) for the duration of the COVID-19 declaration under Se ction 564(b)(1) of the Act, 21 U.S.C. section 360bbb-3(b)(1), unless the authorization is terminated or revoked sooner.  Performed at [facility], [place] [address] [place], [place] [address]   Culture, blood (routine x 2)     Status: None (Preliminary result)   Collection Time: 07/06/21  7:21 PM   Specimen: Right Antecubital; Blood  Result Value Ref Range Status   Specimen Description   Final    RIGHT ANTECUBITAL Performed at [facility] [address] [place], [place] [address]    Special Requests   Final    BOTTLES DRAWN AEROBIC ONLY Blood Culture results may not be optimal due to an inadequate volume of blood received in culture bottles Performed at [facility] [address] [place], [place] [address]    Culture   Final    NO GROWTH 2 DAYS Performed at [facility] [address], [place], [place] [address]    Report Status PENDING  Incomplete  Culture, blood (routine x 2)     Status: None (Preliminary result)   Collection Time: 07/06/21  7:21 PM   Specimen: BLOOD RIGHT  HAND  Result Value Ref Range Status   Specimen Description   Final    BLOOD RIGHT HAND Performed at [facility] [address], [place], [place] [address]    Special Requests   Final    BOTTLES DRAWN AEROBIC ONLY Blood Culture adequate volume Performed at [facility] [address], [place], [place] [address]    Culture   Final    NO GROWTH 2 DAYS Performed at [facility] [address] [place], [place] [address]    Report Status PENDING  Incomplete        Radiology Studies: DG CHEST PORT 1 VIEW  Result Date: 07/06/2021 CLINICAL DATA:  Tachypnea EXAM: PORTABLE CHEST 1 VIEW COMPARISON:  07/02/2021 FINDINGS: Single frontal view of the chest demonstrates a  stable cardiac silhouette. Ectasia of the thoracic aorta. No acute airspace disease, effusion, or pneumothorax. No acute bony abnormalities. IMPRESSION: 1. Stable chest, no acute process. Electronically Signed   By: Randa Ngo M.D.   On: 07/06/2021 21:05        Scheduled Meds:  chlordiazePOXIDE  25 mg Oral BH-qamhs   Followed by   Derrill Memo ON 07/09/2021] chlordiazePOXIDE  25 mg Oral Daily   enoxaparin (LOVENOX) injection  40 mg Subcutaneous Q24H   feeding supplement  237 mL Oral BID BM   folic acid  1 mg Oral Daily   multivitamin with minerals  1 tablet Oral Daily   thiamine  200 mg Oral Daily   Continuous Infusions:     LOS: 6 days     Cordelia Poche, MD [facility] 07/08/2021, 11:21 AM  If 7PM-7AM, please contact night-coverage [email or web address]

## 2021-07-08 NOTE — Progress Notes (Signed)
°   07/08/21 1258  Assess: MEWS Score  Temp 99.7 F (37.6 C)  BP 133/87  Pulse Rate (!) 130  Resp 19  Level of Consciousness Alert  SpO2 (!) 87 %  Assess: MEWS Score  MEWS Temp 0  MEWS Systolic 0  MEWS Pulse 3  MEWS RR 0  MEWS LOC 0  MEWS Score 3  MEWS Score Color Yellow  Assess: if the MEWS score is Yellow or Red  Were vital signs taken at a resting state? Yes  Focused Assessment No change from prior assessment  Does the patient meet 2 or more of the SIRS criteria? No  Does the patient have a confirmed or suspected source of infection? No  Provider and Rapid Response Notified? No (MD notitifed)  MEWS guidelines implemented *See Row Information* Yes  Treat  MEWS Interventions Escalated (See documentation below)  Pain Scale 0-10  Pain Score 0  Take Vital Signs  Increase Vital Sign Frequency  Yellow: Q 2hr X 2 then Q 4hr X 2, if remains yellow, continue Q 4hrs  Escalate  MEWS: Escalate Yellow: discuss with charge nurse/RN and consider discussing with provider and RRT  Notify: Charge Nurse/RN  Name of Charge Nurse/RN Notified Wendy, RN  Date Charge Nurse/RN Notified 07/08/21  Time Charge Nurse/RN Notified 1300  Notify: Provider  Provider Name/Title Teryl Lucy, MD  Date Provider Notified 07/08/21  Time Provider Notified 1320  Notification Type Call  Notification Reason Other (Comment) (Elevated HR.)  Provider response No new orders  Date of Provider Response 07/08/21  Time of Provider Response 1320  Document  Patient Outcome Not stable and remains on department  Progress note created (see row info) Yes  Assess: SIRS CRITERIA  SIRS Temperature  0  SIRS Pulse 1  SIRS Respirations  0  SIRS WBC 0  SIRS Score Sum  1   Pt in yellow MEWS. Yellow MEWS protocol implemented per protocol. Agricultural consultant notified. MD made aware via call. Pt Aox2-3.

## 2021-07-09 LAB — AMMONIA: Ammonia: 37 umol/L — ABNORMAL HIGH (ref 9–35)

## 2021-07-09 NOTE — Plan of Care (Signed)

## 2021-07-09 NOTE — Progress Notes (Signed)
Physical Therapy Treatment Patient Details Name: Natasha Holland MRN: [id number] DOB: 02/06/1951 Today's Date: 07/09/2021   History of Present Illness Patient is 71 y.o. female presented to ED with concern for weakness and recurrent falls. PMH significant for ETOH abuse, HTN. Patient admitted for and memory loss concerning for Wernicke encephalopathy syndrome.    PT Comments    Pt very lethargic and difficulty opening eyes when I entered the room. Worked with pt at bedside and edge of bed for a while trying to stimulate and increase alert levels today. Pt with very few words in response today, no UE active movement today, and Left UE with no active and seemed to have left neglect as well. Total assist to pivot to recliner today and propped with pillows for trunk support. Son arrived at end of session and after asking him more, he stated the pat was working at [employer] 2-3 months ago. That the decline began about a month ago where she just couldn't walk any more.   Will continue to follow while here in acute care.    Recommendations for follow up therapy are one component of a multi-disciplinary discharge planning process, led by the attending physician.  Recommendations may be updated based on patient status, additional functional criteria and insurance authorization.  Follow Up Recommendations  Skilled nursing-short term rehab (<3 hours/day)     Assistance Recommended at Discharge Frequent or constant Supervision/Assistance  Patient can return home with the following Two people to help with walking and/or transfers;Two people to help with bathing/dressing/bathroom;Direct supervision/assist for medications management;Assistance with feeding;Assistance with cooking/housework;Assist for transportation;Direct supervision/assist for financial management;Help with stairs or ramp for entrance   Equipment Recommendations  Rolling walker (2 wheels)    Recommendations for Other Services        Precautions / Restrictions Precautions Precautions: Fall Restrictions Other Position/Activity Restrictions: likes to be called "Natasha Holland"     Mobility  Bed Mobility Overal bed mobility: Needs Assistance       Supine to sit: HOB elevated, Max assist, +2 for physical assistance     General bed mobility comments: sat edge of bed for about 10 minutes with mod -Max A for balance at all times. Pt with very little trunk activation, with verbal and tactile cues would respond picking her head up some and openign her eyes, but then back to forwaqrd slumped over posture and knee extension. Worked with recliner in front of her truned to teh side so she could reach for handrailing, and able to do this for a little bit with assist and cues.    Transfers   Equipment used: 2 person hand held assist Transfers: Sit to/from Stand Sit to Stand: Max assist, +2 physical assistance Stand pivot transfers: +2 physical assistance, +2 safety/equipment, Max assist         General transfer comment: attempted sit to stand 2 times with B hand held supporting pt's forearms, and blocking B knees for support to prevent knee extension and trunk retropulsion. had to assist with hips lift off and full clearance off bed for only about 3 seconds. Then final stand pivot to recliner with full "bear hug" to squat pvot to recliner. total Assist    Ambulation/Gait                   Stairs             Wheelchair Mobility    Modified Rankin (Stroke Patients Only)       Balance Overall  balance assessment: Needs assistance Sitting-balance support: Feet supported, Bilateral upper extremity supported Sitting balance-Leahy Scale: Zero Sitting balance - Comments: pt would not use UEs, Left neglect and L UE did not have any righting or active movement even with response to gravity. Postural control: Posterior lean, Right lateral lean Standing balance support: Bilateral upper extremity supported, During  functional activity Standing balance-Leahy Scale: Zero                              Cognition Arousal/Alertness: Lethargic Behavior During Therapy: Flat affect Overall Cognitive Status: No family/caregiver present to determine baseline cognitive functioning                                 General Comments: today pt very groggy or has decreased alert levels as from prior sessions. Difficult to open eyes and keep them open, responds with 1 word ( very low volume and unclear at times) Was orientated to full name and date of birth and aware she is in [facility]. difficult to keep alert durign session.        Exercises      General Comments        Pertinent Vitals/Pain Pain Assessment Pain Assessment: Faces Pain Score: 0-No pain (noted no facial responses during our session)    Home Living                          Prior Function            PT Goals (current goals can now be found in the care plan section) Acute Rehab PT Goals Patient Stated Goal: unclear at this time, pt unable to participate today PT Goal Formulation: With patient Time For Goal Achievement: 07/17/21 Potential to Achieve Goals: Fair Progress towards PT goals: Progressing toward goals    Frequency    Min 3X/week      PT Plan Discharge plan needs to be updated    Co-evaluation              AM-PAC PT "6 Clicks" Mobility   Outcome Measure  Help needed turning from your back to your side while in a flat bed without using bedrails?: Total Help needed moving from lying on your back to sitting on the side of a flat bed without using bedrails?: Total Help needed moving to and from a bed to a chair (including a wheelchair)?: Total Help needed standing up from a chair using your arms (e.g., wheelchair or bedside chair)?: Total Help needed to walk in hospital room?: Total Help needed climbing 3-5 steps with a railing? : Total 6 Click Score: 6    End  of Session   Activity Tolerance: Patient limited by lethargy Patient left: in chair;with call bell/phone within reach;with chair alarm set;with nursing/sitter in room;with family/visitor present (propped with 2 pllos on each side of patient and under elbows for turnk support and stability) Nurse Communication: Mobility status PT Visit Diagnosis: Muscle weakness (generalized) (M62.81);Difficulty in walking, not elsewhere classified (R26.2);Unsteadiness on feet (R26.81)     Time: 1100-1143 PT Time Calculation (min) (ACUTE ONLY): 43 min  Charges:  $Therapeutic Activity: 8-22 mins $Neuromuscular Re-education: 23-37 mins                     Gatha Mayer, PT, MPT Acute Rehabilitation Services Office: [phone] Pager:  [phone] 07/09/2021    Jeni Duling, Gatha Mayer 07/09/2021, 1:45 PM

## 2021-07-09 NOTE — Progress Notes (Signed)
PROGRESS NOTE    Natasha Holland  HWE:[id number] DOB: June 19, 1950 DOA: 07/02/2021 PCP: Billie Ruddy, MD   Brief Narrative: Natasha Holland is a 71 y.o. female with a history of alcohol use and hypertension presented secondary to weakness and recurrent falls. Concern on admission for wernicke's encephalopathy. Patient managed on high dose thiamine with improvement. PT/OT recommending SNF. Patient developed mild symptoms consistent with alcohol withdrawal and has been started on a Librium taper. Patient also developed an elevated temperature with associated tachycardia and soft blood pressure. Blood cultures obtained but no source for possible infection. Workup is so far negative.   Assessment and Plan: * Wernicke encephalopathy syndrome- (present on admission) Patient with a history of alcohol use. MRI significant for mild increased T2 hyperintense signal in bilateral mamillary bodies. Patient managed on high-dose thiamine with improvement. Recommendations to transition to thiamine 200 mg for maintenance. PT/OT recommending SNF. -Continue thiamine 200 mg PO daily -Check ammonia  Tachycardia Possibly related to alcohol withdrawal. Patient with soft blood pressure that responded appropriately to IV fluids. Previously with elevated temperatures but no true fever. No infectious source. Chest x-ray unremarkable. Blood cultures obtained with no growth to date. No leukocytosis.  Alcohol withdrawal ([place]) Patient developed mild symptoms that have now resolved. -Continue Librium PO scheduled taper and Ativan prn  Malnutrition of moderate degree Dietitian recommendations: Ensure Plus High Protein po BID, each supplement provides 350 kcal and 20 grams of protein.  Alcohol use- (present on admission) Noted. Patient does not plan to drink. Family plan on ensuring limited access to alcohol in the future.  Elevated LFTs Likely related to alcohol use. CT abdomen/pelvis with evidence of fatty liver  disease.  Dysphagia Chronic. Patient is followed by [facility] with plan for outpatient barium swallow. Speech therapy consulted this admission. Speech therapy recommendations (1/30): Dysphagia 3 (Mech soft);Thin liquid  Liquid Administration via: Cup;Straw Medication Administration: Whole meds with liquid Supervision: Staff to assist with self feeding;Full supervision/cueing for compensatory strategies Compensations: Slow rate;Small sips/bites Postural Changes: Seated upright at 90 degrees   Essential hypertension- (present on admission) Patient is on amlodipine as an outpatient which is held. Patient developed soft blood pressure that have responded well to IV fluids. -Continue to hold outpatient antihypertensive medication until blood pressure is stable     DVT prophylaxis: Lovenox Code Status:   Code Status: Full Code Family Communication: None at bedside Disposition Plan: Discharge to SNF in 24 hours if remains stable and pending ammonia  Consultants:  Neurology  Procedures:  None  Antimicrobials: None    Subjective: Patient states she feels weak and tired.  Objective: Vitals:   07/09/21 0738 07/09/21 1318 07/09/21 1332 07/09/21 1345  BP: (!) 139/92 115/82    Pulse: 99 (!) 108 (!) 129 (!) 107  Resp: 19 16    Temp: 98.8 F (37.1 C) 98 F (36.7 C)    TempSrc: Oral Oral    SpO2: 100% 96%    Weight:      Height:        Intake/Output Summary (Last 24 hours) at 07/09/2021 1702 Last data filed at 07/09/2021 1220 Gross per 24 hour  Intake 350 ml  Output 600 ml  Net -250 ml    Filed Weights   07/02/21 1933  Weight: 55 kg    Examination:  General exam: Appears calm and comfortable Respiratory system: Clear to auscultation. Respiratory effort normal. Cardiovascular system: S1 & S2 heard, tachycardia, normal rhythm. No murmurs, rubs, gallops or clicks.  Gastrointestinal system: Abdomen is nondistended, soft and nontender. No organomegaly or masses felt. Normal  bowel sounds heard. Central nervous system: Alert and oriented to person and place. No focal neurological deficits. Generalized weakness Musculoskeletal: No edema. No calf tenderness Skin: No cyanosis. No rashes   Data Reviewed: I have personally reviewed following labs and imaging studies  CBC Lab Results  Component Value Date   WBC 7.7 07/07/2021   RBC 3.83 (L) 07/07/2021   HGB 12.9 07/07/2021   HCT 37.1 07/07/2021   MCV 96.9 07/07/2021   MCH 33.7 07/07/2021   PLT 210 07/07/2021   MCHC 34.8 07/07/2021   RDW 18.9 (H) 07/07/2021   LYMPHSABS 2.6 07/02/2021   MONOABS 1.1 (H) 07/02/2021   EOSABS 0.3 07/02/2021   BASOSABS 0.2 (H) 60/73/7106     Last metabolic panel Lab Results  Component Value Date   NA 134 (L) 07/08/2021   K 3.5 07/08/2021   CL 105 07/08/2021   CO2 25 07/08/2021   BUN 8 07/08/2021   CREATININE 0.44 07/08/2021   GLUCOSE 113 (H) 07/08/2021   GFRNONAA >60 07/08/2021   GFRAA 103 12/23/2019   CALCIUM 8.1 (L) 07/08/2021   PHOS 4.6 07/14/2019   PROT 5.6 (L) 07/08/2021   ALBUMIN 1.8 (L) 07/08/2021   BILITOT 0.7 07/08/2021   ALKPHOS 105 07/08/2021   AST 45 (H) 07/08/2021   ALT 20 07/08/2021   ANIONGAP 4 (L) 07/08/2021    CBG (last 3)  No results for input(s): GLUCAP in the last 72 hours.   GFR: Estimated Creatinine Clearance: 51 mL/min (by C-G formula based on SCr of 0.44 mg/dL).  Coagulation Profile: No results for input(s): INR, PROTIME in the last 168 hours.  Recent Results (from the past 240 hour(s))  Resp Panel by RT-PCR (Flu A&B, Covid) Nasopharyngeal Swab     Status: None   Collection Time: 07/02/21 10:50 PM   Specimen: Nasopharyngeal Swab; Nasopharyngeal(NP) swabs in vial transport medium  Result Value Ref Range Status   SARS Coronavirus 2 by RT PCR NEGATIVE NEGATIVE Final    Comment: (NOTE) SARS-CoV-2 target nucleic acids are NOT DETECTED.  The SARS-CoV-2 RNA is generally detectable in upper respiratory specimens during the acute  phase of infection. The lowest concentration of SARS-CoV-2 viral copies this assay can detect is 138 copies/mL. A negative result does not preclude SARS-Cov-2 infection and should not be used as the sole basis for treatment or other patient management decisions. A negative result may occur with  improper specimen collection/handling, submission of specimen other than nasopharyngeal swab, presence of viral mutation(s) within the areas targeted by this assay, and inadequate number of viral copies(<138 copies/mL). A negative result must be combined with clinical observations, patient history, and epidemiological information. The expected result is Negative.  Fact Sheet for Patients:  EntrepreneurPulse.com.au  Fact Sheet for Healthcare Providers:  IncredibleEmployment.be  This test is no t yet approved or cleared by the [place] FDA and  has been authorized for detection and/or diagnosis of SARS-CoV-2 by FDA under an Emergency Use Authorization (EUA). This EUA will remain  in effect (meaning this test can be used) for the duration of the COVID-19 declaration under Section 564(b)(1) of the Act, 21 U.S.C.section 360bbb-3(b)(1), unless the authorization is terminated  or revoked sooner.       Influenza A by PCR NEGATIVE NEGATIVE Final   Influenza B by PCR NEGATIVE NEGATIVE Final    Comment: (NOTE) The Xpert Xpress SARS-CoV-2/FLU/RSV plus assay is intended as an aid in  the diagnosis of influenza from Nasopharyngeal swab specimens and should not be used as a sole basis for treatment. Nasal washings and aspirates are unacceptable for Xpert Xpress SARS-CoV-2/FLU/RSV testing.  Fact Sheet for Patients: EntrepreneurPulse.com.au  Fact Sheet for Healthcare Providers: IncredibleEmployment.be  This test is not yet approved or cleared by the [place] FDA and has been authorized for detection and/or diagnosis of  SARS-CoV-2 by FDA under an Emergency Use Authorization (EUA). This EUA will remain in effect (meaning this test can be used) for the duration of the COVID-19 declaration under Section 564(b)(1) of the Act, 21 U.S.C. section 360bbb-3(b)(1), unless the authorization is terminated or revoked.  Performed at [facility], [place] [address], [place], [place] [address]   SARS CORONAVIRUS 2 (TAT 6-24 HRS) Nasopharyngeal Nasopharyngeal Swab     Status: None   Collection Time: 07/05/21  5:25 PM   Specimen: Nasopharyngeal Swab  Result Value Ref Range Status   SARS Coronavirus 2 NEGATIVE NEGATIVE Final    Comment: (NOTE) SARS-CoV-2 target nucleic acids are NOT DETECTED.  The SARS-CoV-2 RNA is generally detectable in upper and lower respiratory specimens during the acute phase of infection. Negative results do not preclude SARS-CoV-2 infection, do not rule out co-infections with other pathogens, and should not be used as the sole basis for treatment or other patient management decisions. Negative results must be combined with clinical observations, patient history, and epidemiological information. The expected result is Negative.  Fact Sheet for Patients: SugarRoll.be  Fact Sheet for Healthcare Providers: https://www.woods-mathews.com/  This test is not yet approved or cleared by the [place] FDA and  has been authorized for detection and/or diagnosis of SARS-CoV-2 by FDA under an Emergency Use Authorization (EUA). This EUA will remain  in effect (meaning this test can be used) for the duration of the COVID-19 declaration under Se ction 564(b)(1) of the Act, 21 U.S.C. section 360bbb-3(b)(1), unless the authorization is terminated or revoked sooner.  Performed at [facility], [place] [address], [place], [place] [address]   Culture, blood (routine x 2)     Status: None (Preliminary result)   Collection Time: 07/06/21  7:21  PM   Specimen: Right Antecubital; Blood  Result Value Ref Range Status   Specimen Description   Final    RIGHT ANTECUBITAL Performed at [facility] [address], [place], [place] [address]    Special Requests   Final    BOTTLES DRAWN AEROBIC ONLY Blood Culture results may not be optimal due to an inadequate volume of blood received in culture bottles Performed at [facility] [address] [place], [place] [address]    Culture   Final    NO GROWTH 3 DAYS Performed at [facility], [place] [address], [place], [place] [address]    Report Status PENDING  Incomplete  Culture, blood (routine x 2)     Status: None (Preliminary result)   Collection Time: 07/06/21  7:21 PM   Specimen: BLOOD RIGHT HAND  Result Value Ref Range Status   Specimen Description   Final    BLOOD RIGHT HAND Performed at [facility] [address], [place], [place] [address]    Special Requests   Final    BOTTLES DRAWN AEROBIC ONLY Blood Culture adequate volume Performed at [facility] [address], [place], [place] [address]    Culture   Final    NO GROWTH 3 DAYS Performed at [facility], [place] [address] [place], [place] [address]    Report Status PENDING  Incomplete        Radiology Studies: No results found.      Scheduled Meds:  enoxaparin (LOVENOX) injection  40 mg Subcutaneous Q24H   feeding supplement  237 mL Oral BID BM   folic acid  1 mg Oral Daily   multivitamin with minerals  1 tablet Oral Daily   thiamine  200 mg Oral Daily   Continuous Infusions:     LOS: 7 days     Cordelia Poche, MD [facility] 07/09/2021, 5:02 PM  If 7PM-7AM, please contact night-coverage [email or web address]

## 2021-07-09 NOTE — Progress Notes (Signed)
°   07/09/21 2117  Assess: MEWS Score  Temp 99.5 F (37.5 C)  BP 112/73  Pulse Rate (!) 117  Resp 20  SpO2 (!) 87 %  O2 Device Nasal Cannula  Assess: MEWS Score  MEWS Temp 0  MEWS Systolic 0  MEWS Pulse 2  MEWS RR 0  MEWS LOC 0  MEWS Score 2  MEWS Score Color Yellow  Assess: if the MEWS score is Yellow or Red  Were vital signs taken at a resting state? Yes  Focused Assessment No change from prior assessment  Does the patient meet 2 or more of the SIRS criteria? No  Does the patient have a confirmed or suspected source of infection? No  Provider and Rapid Response Notified? No  MEWS guidelines implemented *See Row Information* Yes  Take Vital Signs  Increase Vital Sign Frequency  Yellow: Q 2hr X 2 then Q 4hr X 2, if remains yellow, continue Q 4hrs  Escalate  MEWS: Escalate Yellow: discuss with charge nurse/RN and consider discussing with provider and RRT  Notify: Charge Nurse/RN  Name of Charge Nurse/RN Notified Debbie RN  Document  Patient Outcome Other (Comment) (unchanged, continue to monitor)  Assess: SIRS CRITERIA  SIRS Temperature  0  SIRS Pulse 1  SIRS Respirations  0  SIRS WBC 0  SIRS Score Sum  1

## 2021-07-10 DIAGNOSIS — M6259 Muscle wasting and atrophy, not elsewhere classified, multiple sites: Secondary | ICD-10-CM | POA: Diagnosis not present

## 2021-07-10 DIAGNOSIS — R000 Tachycardia, unspecified: Secondary | ICD-10-CM | POA: Diagnosis not present

## 2021-07-10 DIAGNOSIS — R1312 Dysphagia, oropharyngeal phase: Secondary | ICD-10-CM | POA: Diagnosis not present

## 2021-07-10 DIAGNOSIS — Z7401 Bed confinement status: Secondary | ICD-10-CM | POA: Diagnosis not present

## 2021-07-10 DIAGNOSIS — R296 Repeated falls: Secondary | ICD-10-CM | POA: Diagnosis not present

## 2021-07-10 DIAGNOSIS — F10939 Alcohol use, unspecified with withdrawal, unspecified: Secondary | ICD-10-CM | POA: Diagnosis not present

## 2021-07-10 DIAGNOSIS — R404 Transient alteration of awareness: Secondary | ICD-10-CM | POA: Diagnosis not present

## 2021-07-10 DIAGNOSIS — R7989 Other specified abnormal findings of blood chemistry: Secondary | ICD-10-CM | POA: Diagnosis not present

## 2021-07-10 DIAGNOSIS — R279 Unspecified lack of coordination: Secondary | ICD-10-CM | POA: Diagnosis not present

## 2021-07-10 DIAGNOSIS — J9601 Acute respiratory failure with hypoxia: Secondary | ICD-10-CM | POA: Diagnosis not present

## 2021-07-10 DIAGNOSIS — F419 Anxiety disorder, unspecified: Secondary | ICD-10-CM | POA: Diagnosis not present

## 2021-07-10 DIAGNOSIS — M6281 Muscle weakness (generalized): Secondary | ICD-10-CM | POA: Diagnosis not present

## 2021-07-10 DIAGNOSIS — I10 Essential (primary) hypertension: Secondary | ICD-10-CM | POA: Diagnosis not present

## 2021-07-10 DIAGNOSIS — Z9181 History of falling: Secondary | ICD-10-CM | POA: Diagnosis not present

## 2021-07-10 DIAGNOSIS — J432 Centrilobular emphysema: Secondary | ICD-10-CM

## 2021-07-10 DIAGNOSIS — E440 Moderate protein-calorie malnutrition: Secondary | ICD-10-CM | POA: Diagnosis not present

## 2021-07-10 DIAGNOSIS — E512 Wernicke's encephalopathy: Secondary | ICD-10-CM | POA: Diagnosis not present

## 2021-07-10 DIAGNOSIS — R2681 Unsteadiness on feet: Secondary | ICD-10-CM | POA: Diagnosis not present

## 2021-07-10 DIAGNOSIS — J189 Pneumonia, unspecified organism: Secondary | ICD-10-CM | POA: Diagnosis not present

## 2021-07-10 LAB — D-DIMER, QUANTITATIVE: D-Dimer, Quant: 0.61 ug/mL-FEU — ABNORMAL HIGH (ref 0.00–0.50)

## 2021-07-10 MED ORDER — METOPROLOL TARTRATE 25 MG PO TABS: 12.5000 mg | ORAL_TABLET | Freq: Two times a day (BID) | ORAL | Status: DC

## 2021-07-10 MED ORDER — THIAMINE HCL 100 MG PO TABS: 200.0000 mg | ORAL_TABLET | Freq: Every day | ORAL | Status: AC

## 2021-07-10 MED ORDER — ENSURE ENLIVE PO LIQD: 237.0000 mL | Freq: Two times a day (BID) | ORAL | Status: AC

## 2021-07-10 NOTE — Progress Notes (Signed)
Patient noted to be in yellow mews due to HR 125, this is chronic for patient. MC aware. Patient discharging to SNF on low dose lopressor.

## 2021-07-10 NOTE — Progress Notes (Signed)
Occupational Therapy Treatment Patient Details Name: Natasha Holland MRN: [id number] DOB: 1950-12-29 Today's Date: 07/10/2021   History of present illness Patient is 71 y.o. female presented to ED with concern for weakness and recurrent falls. PMH significant for ETOH abuse, HTN. Patient admitted for and memory loss concerning for Wernicke encephalopathy syndrome.   OT comments  Patient was noted to  have depressive mood on this date. Patient mentioned that she felt depressed with nursing made aware. Patient was noted to have continued decreased control over BUE with slow ataxic movements as compared to last session. Patients HR was 117 bpm at rest in room. Patient required more assistance with self feeding with cues for initiation and scooping/spearing items to bring to mouth wich is a change since last session. Patient would continue to benefit from skilled OT services at this time while admitted and after d/c to address noted deficits in order to improve overall safety and independence in ADLs.     Recommendations for follow up therapy are one component of a multi-disciplinary discharge planning process, led by the attending physician.  Recommendations may be updated based on patient status, additional functional criteria and insurance authorization.    Follow Up Recommendations  Skilled nursing-short term rehab (<3 hours/day)    Assistance Recommended at Discharge Frequent or constant Supervision/Assistance  Patient can return home with the following  Two people to help with walking and/or transfers;A lot of help with bathing/dressing/bathroom;A lot of help with walking and/or transfers;Direct supervision/assist for medications management;Assist for transportation;Help with stairs or ramp for entrance;Direct supervision/assist for financial management;Assistance with feeding   Equipment Recommendations  Other (comment) (defer to next venue)    Recommendations for Other Services       Precautions / Restrictions Precautions Precautions: Fall Precaution Comments: can't recall falls Restrictions Weight Bearing Restrictions: No Other Position/Activity Restrictions: likes to be called "[name]"       Mobility Bed Mobility                    Transfers                         Balance                                           ADL either performed or assessed with clinical judgement   ADL Overall ADL's : Needs assistance/impaired Eating/Feeding: Moderate assistance;Bed level Eating/Feeding Details (indicate cue type and reason): patient was sitting in bed stairing at plate upon enterance to room. patient required set up of utensils in front of patient on R side to encourage R side use. patient noted to have slower movements with continued ataxic in BUE. patient was noted to need hand over hand for holding coffee cup with patient noted to spontaniously let go of cup which would have resulted in spill. nurse made aware of increased support needed for meals. nurse verbalized understanding. nurse educated on patients reports of depression. nurse verbalized understanding.                                        Extremity/Trunk Assessment              Vision       Perception  Praxis      Cognition Arousal/Alertness: Lethargic Behavior During Therapy: Flat affect Overall Cognitive Status: No family/caregiver present to determine baseline cognitive functioning                                 General Comments: patient was noted to have depressive mood on this date with patient even reporting " i feel depressed" nursing made aware. 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        Market researcher Comments      Pertinent Vitals/ Pain       Pain Assessment Pain Assessment: No/denies pain  Home  Living                                          Prior Functioning/Environment              Frequency  Min 2X/week        Progress Toward Goals  OT Goals(current goals can now be found in the care plan section)  Progress towards OT goals: Not progressing toward goals - comment (patient noted to be less alert today)  Acute Rehab OT Goals OT Goal Formulation: With patient Time For Goal Achievement: 07/18/21 Potential to Achieve Goals: Good  Plan Discharge plan remains appropriate    Co-evaluation                 AM-PAC OT "6 Clicks" Daily Activity     Outcome Measure   Help from another person eating meals?: A Little Help from another person taking care of personal grooming?: A Little Help from another person toileting, which includes using toliet, bedpan, or urinal?: A Lot Help from another person bathing (including washing, rinsing, drying)?: A Lot Help from another person to put on and taking off regular upper body clothing?: A Lot Help from another person to put on and taking off regular lower body clothing?: A Lot 6 Click Score: 14    End of Session    OT Visit Diagnosis: Unsteadiness on feet (R26.81);Ataxia, unspecified (R27.0);Repeated falls (R29.6);History of falling (Z91.81)   Activity Tolerance Patient tolerated treatment well   Patient Left in bed;with call bell/phone within reach;with bed alarm set   Nurse Communication Other (comment) (self feeding needs)        Time: 9371-6967 OT Time Calculation (min): 22 min  Charges: OT General Charges $OT Visit: 1 Visit OT Treatments $Self Care/Home Management : 8-22 mins  Jackelyn Poling OTR/L, MS Acute Rehabilitation Department Office# [phone] Pager# [phone]   Marcellina Millin 07/10/2021, 9:26 AM

## 2021-07-10 NOTE — Assessment & Plan Note (Signed)
No discernable acute process. Patient with a history of emphysema. Able to wean to room air at rest but will likely need 1-2 L/min of oxygen with ambulation.

## 2021-07-10 NOTE — TOC Transition Note (Addendum)
Transition of Care [facility]) - CM/SW Discharge Note   Patient Details  Name: Natasha Holland MRN: [id number] Date of Birth: 1950/11/09  [facility] CM/SW Contact:  Trish Mage, LCSW Phone Number: 07/10/2021, 11:18 AM   Clinical Narrative:   Patient who is stable for d/c will transfer to [facility] today.  Family alerted.  PTAR arranged.  Nursing, please call report to [phone], room 307. TOC sign off.     Final next level of care: Skilled Nursing Facility Barriers to Discharge: Barriers Resolved   Patient Goals and CMS Choice Patient states their goals for this hospitalization and ongoing recovery are:: go back to work [employer].gov Compare Post Acute Care list provided to:: Patient Choice offered to / list presented to : Patient  Discharge Placement                       Discharge Plan and Services   Discharge Planning Services: CM Consult Post Acute Care Choice: [place]                               Social Determinants of Health (SDOH) Interventions     Readmission Risk Interventions No flowsheet data found.

## 2021-07-10 NOTE — Progress Notes (Signed)
Nutrition Follow-up  DOCUMENTATION CODES:   Non-severe (moderate) malnutrition in context of acute illness/injury  INTERVENTION:   -Ensure Plus High Protein po BID, each supplement provides 350 kcal and 20 grams of protein.   NUTRITION DIAGNOSIS:   Moderate Malnutrition related to acute illness, dysphagia, lethargy/confusion as evidenced by energy intake < 75% for > 7 days, moderate fat depletion, moderate muscle depletion.  Ongoing.  GOAL:   Patient will meet greater than or equal to 90% of their needs  Progressing.  MONITOR:   PO intake, Supplement acceptance, Labs, Weight trends, I & O's  ASSESSMENT:   71 y.o. female with medical history significant for hypertension, alcohol use who presents with concerns of weakness and recurrent falls.  Patient currently consuming 25-100% of meals over the past 48 hours. Pt is accepting Ensure supplements. MVI was d/c. Continues thiamine supplementation.   Admission weight: 121 lbs. No new weights for this admission.   Medications: Thiamine  Labs reviewed: Low Na Thiamine checked 1/30, came back low (47.8) -being repleted  Diet Order:   Diet Order             DIET DYS 3 Room service appropriate? Yes; Fluid consistency: Thin  Diet effective now                   EDUCATION NEEDS:   No education needs have been identified at this time  Skin:  Skin Assessment: Reviewed RN Assessment  Last BM:  2/6  Height:   Ht Readings from Last 1 Encounters:  07/02/21 5\' 2"  (1.575 m)    Weight:   Wt Readings from Last 1 Encounters:  07/02/21 55 kg    BMI:  Body mass index is 22.18 kg/m.  Estimated Nutritional Needs:   Kcal:  1650-1850  Protein:  80-90g  Fluid:  1.8L/day  Clayton Bibles, MS, RD, LDN Inpatient Clinical Dietitian Contact information available via Amion

## 2021-07-10 NOTE — Care Management Important Message (Signed)
Important Message  Patient Details IM Letter placed in Patients room. Name: Natasha Holland MRN: [id number] Date of Birth: [birth year]   Medicare Important Message Given:  Yes     [name] 07/10/2021, 10:58 AM

## 2021-07-10 NOTE — Progress Notes (Signed)
Speech Language Pathology Treatment: Dysphagia  Patient Details Name: Memori Sammon MRN: [id number] DOB: [birth year] Today's Date: 07/10/2021 Time: 7035-0093 SLP Time Calculation (min) (ACUTE ONLY): 20 min  Assessment / Plan / Recommendation Clinical Impression  Patient seen by SLP for skilled treatment focusing on her dysphagia goals. Patient awake and alert sitting up in bed with lunch meal try in front of her. Patient was oriented to month, year and place and she was aware of recommendation for her to go to "rehab for a week". SLP assisted patient with self-feeding with fork to eat some of her Dys 3 solids but patient able to use spoon to feed self ice cream. She was able to hold cup and drink thin liquids via straw sips however she continues to have difficulty holding cups, utensils and will drop things suddenly. Patient continues with some confusion, calling out to someone she thought she knew (a patient in room across hallway) and asking SLP "how long have you been working here? 50 years?" Patient exhibited prolonged mastication and oral transit of solids but no overt s/s aspiration or penetration with liquids or solids. As patient appears to be tolerating current PO consistency of Dys 3 solids, thin liquids, plan for SLP to s/o at this time. Patient may benefit from SLP screen versus evaluation at next venue of care (SNF) to determine continued need for dysphagia treatment.    HPI HPI: Patient is 71 y.o. female presented to ED with concern for weakness and recurrent falls. PMH significant for ETOH abuse, HTN. Patient admitted for and memory loss concerning for Wernicke encephalopathy syndrome.      SLP Plan  Discharge SLP treatment due to (comment) (goals met)      Recommendations for follow up therapy are one component of a multi-disciplinary discharge planning process, led by the attending physician.  Recommendations may be updated based on patient status, additional functional criteria  and insurance authorization.    Recommendations  Diet recommendations: Dysphagia 3 (mechanical soft);Thin liquid Liquids provided via: Cup;Straw Medication Administration: Whole meds with liquid Supervision: Full supervision/cueing for compensatory strategies;Staff to assist with self feeding Compensations: Slow rate;Small sips/bites Postural Changes and/or Swallow Maneuvers: Seated upright 90 degrees                Oral Care Recommendations: Oral care BID;Staff/trained caregiver to provide oral care Follow Up Recommendations: Skilled nursing-short term rehab (<3 hours/day) Assistance recommended at discharge: Frequent or constant Supervision/Assistance SLP Visit Diagnosis: Dysphagia, unspecified (R13.10) Plan: Discharge SLP treatment due to (comment) (goals met)       Sonia Baller, MA, CCC-SLP Speech Therapy

## 2021-07-10 NOTE — Assessment & Plan Note (Addendum)
Noted on admission two year prior. Treated at that time with amoxicillin for presumed aspiration pneumonia. RLL collapse was evaluated via bronchoscopy with no endobronchial lesion identified. Chest x-ray this admission appears stable with no evidence of active disease. Continue Incruse ellipta and Combivent.

## 2021-07-10 NOTE — Progress Notes (Signed)
Pt with no urine output for 24 hours. Bladder scan showing 129ml. Pt denies discomfort, bladder is not distended. On call NP notified. No new orders at this time, will continue to monitor.

## 2021-07-10 NOTE — Discharge Summary (Signed)
Physician Discharge Summary   Patient: Natasha Holland MRN: [id number] DOB: 09/04/1950  Admit date:     07/02/2021  Discharge date: 07/10/21  Discharge Physician: Cordelia Poche, MD   PCP: Billie Ruddy, MD   Recommendations at discharge:   Neurology follow-up for Wernicke encephalopathy Cardiology follow-up for sinus tachycardia PCP follow-up for hospital follow-up visit  Discharge Diagnoses: Principal Problem:   Wernicke encephalopathy syndrome Active Problems:   Alcohol withdrawal ([place])   Sinus tachycardia   Alcohol use   Malnutrition of moderate degree   Essential hypertension   Dysphagia   Elevated LFTs   Centrilobular emphysema (HCC)   Acute respiratory failure with hypoxia (HCC)  Resolved Problems:   * No resolved hospital problems. *   Hospital Course: Natasha Holland is a 71 y.o. female with a history of alcohol use and hypertension presented secondary to weakness and recurrent falls. Concern on admission for wernicke's encephalopathy. Patient managed on high dose thiamine with improvement. PT/OT recommending SNF. Patient developed mild symptoms consistent with alcohol withdrawal and has been started on a Librium taper. Patient also developed an elevated temperature with associated tachycardia and soft blood pressure. Blood cultures obtained but no source for possible infection. Workup is so far negative.  Assessment and Plan: * Wernicke encephalopathy syndrome- (present on admission) Patient with a history of alcohol use. MRI significant for mild increased T2 hyperintense signal in bilateral mamillary bodies. Thiamine (Vitamin B1) of 47.8 (low). Patient managed on high-dose thiamine with improvement with transition to thiamine 200 mg daily per neurology recommendations. PT/OT recommending SNF. Recheck thiamine level as an outpatient.  Sinus tachycardia Possibly related to alcohol withdrawal. Patient with soft blood pressure that responded appropriately to IV fluids.  Previously with elevated temperatures but no true fever. No infectious source. Chest x-ray unremarkable. Blood cultures obtained with no growth to date. No leukocytosis. D-dimer of 0.61 which is normal when corrected for age, and patient's Wells score indicates low risk. No associated chest pain or dyspnea. Hypoxia can be explained by patient's underlying emphysema. Upon chart review, it appears patient has chronic sinus tachycardia. Will discharge on low-dose metoprolol and recommend outpatient cardiology follow-up.  Alcohol withdrawal ([place]) Patient developed mild symptoms that have now resolved. Completed Librium PO scheduled taper and Ativan prn per [facility]WA.  Malnutrition of moderate degree Dietitian recommendations: Ensure Plus High Protein po BID, each supplement provides 350 kcal and 20 grams of protein.  Alcohol use- (present on admission) Noted. Patient does not plan to drink. Family plan on ensuring limited access to alcohol in the future. Patient received a Librium taper, which she completed.  Elevated LFTs Likely related to alcohol use. CT abdomen/pelvis with evidence of fatty liver disease. Stable.  Dysphagia Chronic. Patient is followed by [facility] with plan for outpatient barium swallow. Speech therapy consulted this admission. Speech therapy recommendations (1/30): Dysphagia 3 (Mech soft);Thin liquid  Liquid Administration via: Cup;Straw Medication Administration: Whole meds with liquid Supervision: Staff to assist with self feeding;Full supervision/cueing for compensatory strategies Compensations: Slow rate;Small sips/bites Postural Changes: Seated upright at 90 degrees   Essential hypertension- (present on admission) Patient is on amlodipine as an outpatient which is held. Patient developed soft blood pressure that responded well to IV fluid bolus. Will discontinue amlodipine and start low-dose metoprolol for associated tachycardia.  Acute respiratory failure with hypoxia  (HCC) No discernable acute process. Patient with a history of emphysema. Able to wean to room air at rest but will likely need 1-2 L/min of oxygen  with ambulation.  Centrilobular emphysema ([place]) Noted on admission two year prior. Treated at that time with amoxicillin for presumed aspiration pneumonia. RLL collapse was evaluated via bronchoscopy with no endobronchial lesion identified. Chest x-ray this admission appears stable with no evidence of active disease. Continue Incruse ellipta and Combivent.           Consultants: Neurology Procedures performed: None  Disposition: Skilled nursing facility Diet recommendation:  Dysphagia type 3 thin Liquid  DISCHARGE MEDICATION: Allergies as of 07/10/2021   No Known Allergies      Medication List     STOP taking these medications    amoxicillin 500 MG tablet Commonly known as: AMOXIL   fexofenadine 60 MG tablet Commonly known as: Allegra Allergy   Vitamin D (Ergocalciferol) 1.25 MG (50000 UNIT) Caps capsule Commonly known as: DRISDOL       TAKE these medications    amLODipine 5 MG tablet Commonly known as: NORVASC TAKE 1 TABLET(5 MG) BY MOUTH DAILY   BD Eclipse Syringe 25G X 5/8" 3 ML Misc Generic drug: SYRINGE-NEEDLE (DISP) 3 ML Use as directed   Blood Pressure Cuff Misc Use daily as directed.   feeding supplement Liqd Take 237 mLs by mouth 2 (two) times daily between meals.   folic acid 1 MG tablet Commonly known as: FOLVITE Take 1 tablet (1 mg total) by mouth daily.   Ipratropium-Albuterol 20-100 MCG/ACT Aers respimat Commonly known as: COMBIVENT Inhale 1 puff into the lungs in the morning and at bedtime.   metoprolol tartrate 25 MG tablet Commonly known as: LOPRESSOR Take 0.5 tablets (12.5 mg total) by mouth 2 (two) times daily.   Multivitamin Adults 50+ Tabs Take 1 tablet by mouth daily.   omeprazole 20 MG capsule Commonly known as: PRILOSEC Take 1 capsule (20 mg total) by mouth daily.    thiamine 100 MG tablet Take 2 tablets (200 mg total) by mouth daily. Start taking on: July 11, 2021   umeclidinium bromide 62.5 MCG/INH Aepb Commonly known as: INCRUSE ELLIPTA Inhale 1 puff into the lungs daily.        Contact information for after-discharge care     Destination     [facility] Preferred SNF .   Service: Skilled Nursing Contact information: [address] [place] [phone]                     Discharge Exam: Danley Danker Weights   07/02/21 1933  Weight: 55 kg   General exam: Appears calm and comfortable Respiratory system: Clear to auscultation. Respiratory effort normal. Cardiovascular system: S1 & S2 heard, RRR. No murmurs, rubs, gallops or clicks. Gastrointestinal system: Abdomen is nondistended, soft and nontender. No organomegaly or masses felt. Normal bowel sounds heard. Central nervous system: Alert and oriented to person and place. No focal neurological deficits. Musculoskeletal: No edema. No calf tenderness Skin: No cyanosis. No rashes Psychiatry: Blunt affect. Psychomotor retardation   Condition at discharge: fair  The results of significant diagnostics from this hospitalization (including imaging, microbiology, ancillary and laboratory) are listed below for reference.   Imaging Studies: CT HEAD WO CONTRAST (5MM)  Result Date: 07/02/2021 CLINICAL DATA:  Mental status change, unknown cause EXAM: CT HEAD WITHOUT CONTRAST TECHNIQUE: Contiguous axial images were obtained from the base of the skull through the vertex without intravenous contrast. RADIATION DOSE REDUCTION: This exam was performed according to the departmental dose-optimization program which includes automated exposure control, adjustment of the mA and/or kV according  to patient size and/or use of iterative reconstruction technique. COMPARISON:  CT head 06/30/2021 FINDINGS: Brain: Patchy and confluent areas of decreased  attenuation are noted throughout the deep and periventricular white matter of the cerebral hemispheres bilaterally, compatible with chronic microvascular ischemic disease. No evidence of large-territorial acute infarction. No parenchymal hemorrhage. No mass lesion. No extra-axial collection. No mass effect or midline shift. No hydrocephalus. Basilar cisterns are patent. Vascular: No hyperdense vessel. Atherosclerotic calcifications are present within the cavernous internal carotid arteries. Skull: No acute fracture or focal lesion. Old left lamina papyracea fracture. Sinuses/Orbits: Paranasal sinuses and mastoid air cells are clear. The orbits are unremarkable. Other: None. IMPRESSION: No acute intracranial abnormality. Electronically Signed   By: Iven Finn M.D.   On: 07/02/2021 22:38   CT HEAD WO CONTRAST (5MM)  Result Date: 06/30/2021 CLINICAL DATA:  Memory loss. Acute neurologic deficit. Unwitnessed fall 1 week ago. EXAM: CT HEAD WITHOUT CONTRAST TECHNIQUE: Contiguous axial images were obtained from the base of the skull through the vertex without intravenous contrast. RADIATION DOSE REDUCTION: This exam was performed according to the departmental dose-optimization program which includes automated exposure control, adjustment of the mA and/or kV according to patient size and/or use of iterative reconstruction technique. COMPARISON:  None. FINDINGS: Brain: The brainstem, cerebellum, cerebral peduncles, thalami, basal ganglia, basilar cisterns, and ventricular system appear within normal limits. Periventricular white matter and corona radiata hypodensities favor chronic ischemic microvascular white matter disease. No intracranial hemorrhage, mass lesion, or acute CVA. Vascular: There is atherosclerotic calcification of the cavernous carotid arteries bilaterally. Skull: Unremarkable Sinuses/Orbits: Old left medial orbital wall fracture. Mild chronic right posterior ethmoid sinusitis. Other: No supplemental  non-categorized findings. IMPRESSION: 1. No acute intracranial findings. 2. Periventricular white matter and corona radiata hypodensities favor chronic ischemic microvascular white matter disease. 3. Atherosclerosis. 4. Old healed left medial orbital wall fracture. 5. Mild chronic right ethmoid sinusitis. Electronically Signed   By: Van Clines M.D.   On: 06/30/2021 12:31   MR BRAIN WO CONTRAST  Result Date: 07/03/2021 CLINICAL DATA:  Ataxia EXAM: MRI HEAD WITHOUT CONTRAST TECHNIQUE: Multiplanar, multiecho pulse sequences of the brain and surrounding structures were obtained without intravenous contrast. COMPARISON:  No prior MRI, correlation is made with CT head 07/02/2021 FINDINGS: Evaluation is limited by motion artifact. Brain: No restricted diffusion to suggest acute or subacute infarct. No acute hemorrhage, mass, mass effect, or midline shift. No foci of hemosiderin deposition to suggest remote hemorrhage. No hydrocephalus or extra-axial collection. Mild T2 hyperintense signal in the periventricular white matter, likely the sequela of chronic small vessel ischemic disease. Mildly increased T2 hyperintense signal in the mamillary bodies bilaterally (series 11, image 21); no increased T2 hyperintense signal along the third ventricle, in the dorsomedial thalami, or in the periaqueductal region. Vascular: Normal flow voids. Skull and upper cervical spine: Normal marrow signal. Sinuses/Orbits: Mild mucosal thickening in the right maxillary sinus and right posterior ethmoid air cells. Right-greater-than-left exophthalmos. Remote left medial orbital fracture. Other: None. IMPRESSION: Mildly increased T2 hyperintense signal in the mamillary bodies bilaterally, as can be seen in the setting of Wernicke encephalopathy; however, no abnormal signal is seen in the other areas that can be typically involved. No other acute intracranial process. Electronically Signed   By: Merilyn Baba M.D.   On: 07/03/2021 19:27    CT ABDOMEN PELVIS W CONTRAST  Result Date: 07/02/2021 CLINICAL DATA:  Nausea and vomiting with abdominal pain EXAM: CT ABDOMEN AND PELVIS WITH CONTRAST TECHNIQUE: Multidetector CT imaging of  the abdomen and pelvis was performed using the standard protocol following bolus administration of intravenous contrast. RADIATION DOSE REDUCTION: This exam was performed according to the departmental dose-optimization program which includes automated exposure control, adjustment of the mA and/or kV according to patient size and/or use of iterative reconstruction technique. CONTRAST:  131mL OMNIPAQUE IOHEXOL 300 MG/ML  SOLN COMPARISON:  07/14/2019 FINDINGS: Lower chest: No acute abnormality. Hepatobiliary: Fatty infiltration of the liver is noted. Gallbladder is within normal limits. Mild fullness of the common bile duct is noted although stable in appearance from the prior exam. Pancreas: Unremarkable. No pancreatic ductal dilatation or surrounding inflammatory changes. Spleen: Normal in size without focal abnormality. Adrenals/Urinary Tract: Adrenal glands are within normal limits. Kidneys demonstrate a normal enhancement pattern bilaterally. No renal calculi or obstructive changes are seen. Delayed images demonstrate normal excretion of contrast. The bladder is well distended. Stomach/Bowel: The appendix is within normal limits. No obstructive or inflammatory changes of the colon are seen. Small bowel is within normal limits. Stomach is unremarkable. Vascular/Lymphatic: Aortic atherosclerosis. No enlarged abdominal or pelvic lymph nodes. Reproductive: Uterus and bilateral adnexa are unremarkable. Other: No abdominal wall hernia or abnormality. No abdominopelvic ascites. Musculoskeletal: No acute bony abnormality is noted. Degenerative changes of lumbar spine are seen. IMPRESSION: Fatty infiltration of the liver. Mildly prominent common bile duct but stable from the prior exam likely consistent with the patient's age. No  acute abnormality noted. Electronically Signed   By: Inez Catalina M.D.   On: 07/02/2021 21:05   DG CHEST PORT 1 VIEW  Result Date: 07/06/2021 CLINICAL DATA:  Tachypnea EXAM: PORTABLE CHEST 1 VIEW COMPARISON:  07/02/2021 FINDINGS: Single frontal view of the chest demonstrates a stable cardiac silhouette. Ectasia of the thoracic aorta. No acute airspace disease, effusion, or pneumothorax. No acute bony abnormalities. IMPRESSION: 1. Stable chest, no acute process. Electronically Signed   By: Randa Ngo M.D.   On: 07/06/2021 21:05   DG Chest Port 1 View  Result Date: 07/02/2021 CLINICAL DATA:  Shortness of breath. EXAM: PORTABLE CHEST 1 VIEW COMPARISON:  Chest x-ray 05/05/2020. FINDINGS: The heart size and mediastinal contours are within normal limits. Both lungs are clear. The visualized skeletal structures are unremarkable. IMPRESSION: No active disease. Electronically Signed   By: Ronney Asters M.D.   On: 07/02/2021 20:14    Microbiology: Results for orders placed or performed during the hospital encounter of 07/02/21  Resp Panel by RT-PCR (Flu A&B, Covid) Nasopharyngeal Swab     Status: None   Collection Time: 07/02/21 10:50 PM   Specimen: Nasopharyngeal Swab; Nasopharyngeal(NP) swabs in vial transport medium  Result Value Ref Range Status   SARS Coronavirus 2 by RT PCR NEGATIVE NEGATIVE Final    Comment: (NOTE) SARS-CoV-2 target nucleic acids are NOT DETECTED.  The SARS-CoV-2 RNA is generally detectable in upper respiratory specimens during the acute phase of infection. The lowest concentration of SARS-CoV-2 viral copies this assay can detect is 138 copies/mL. A negative result does not preclude SARS-Cov-2 infection and should not be used as the sole basis for treatment or other patient management decisions. A negative result may occur with  improper specimen collection/handling, submission of specimen other than nasopharyngeal swab, presence of viral mutation(s) within the areas  targeted by this assay, and inadequate number of viral copies(<138 copies/mL). A negative result must be combined with clinical observations, patient history, and epidemiological information. The expected result is Negative.  Fact Sheet for Patients:  EntrepreneurPulse.com.au  Fact Sheet for Healthcare Providers:  IncredibleEmployment.be  This test is no t yet approved or cleared by the [place] and  has been authorized for detection and/or diagnosis of SARS-CoV-2 by FDA under an Emergency Use Authorization (EUA). This EUA will remain  in effect (meaning this test can be used) for the duration of the COVID-19 declaration under Section 564(b)(1) of the Act, 21 U.S.C.section 360bbb-3(b)(1), unless the authorization is terminated  or revoked sooner.       Influenza A by PCR NEGATIVE NEGATIVE Final   Influenza B by PCR NEGATIVE NEGATIVE Final    Comment: (NOTE) The Xpert Xpress SARS-CoV-2/FLU/RSV plus assay is intended as an aid in the diagnosis of influenza from Nasopharyngeal swab specimens and should not be used as a sole basis for treatment. Nasal washings and aspirates are unacceptable for Xpert Xpress SARS-CoV-2/FLU/RSV testing.  Fact Sheet for Patients: EntrepreneurPulse.com.au  Fact Sheet for Healthcare Providers: IncredibleEmployment.be  This test is not yet approved or cleared by the [place] FDA and has been authorized for detection and/or diagnosis of SARS-CoV-2 by FDA under an Emergency Use Authorization (EUA). This EUA will remain in effect (meaning this test can be used) for the duration of the COVID-19 declaration under Section 564(b)(1) of the Act, 21 U.S.C. section 360bbb-3(b)(1), unless the authorization is terminated or revoked.  Performed at [facility], [place] [address] [place], [place] [address]   SARS CORONAVIRUS 2 (TAT 6-24 HRS) Nasopharyngeal  Nasopharyngeal Swab     Status: None   Collection Time: 07/05/21  5:25 PM   Specimen: Nasopharyngeal Swab  Result Value Ref Range Status   SARS Coronavirus 2 NEGATIVE NEGATIVE Final    Comment: (NOTE) SARS-CoV-2 target nucleic acids are NOT DETECTED.  The SARS-CoV-2 RNA is generally detectable in upper and lower respiratory specimens during the acute phase of infection. Negative results do not preclude SARS-CoV-2 infection, do not rule out co-infections with other pathogens, and should not be used as the sole basis for treatment or other patient management decisions. Negative results must be combined with clinical observations, patient history, and epidemiological information. The expected result is Negative.  Fact Sheet for Patients: SugarRoll.be  Fact Sheet for Healthcare Providers: https://www.woods-mathews.com/  This test is not yet approved or cleared by the [place] FDA and  has been authorized for detection and/or diagnosis of SARS-CoV-2 by FDA under an Emergency Use Authorization (EUA). This EUA will remain  in effect (meaning this test can be used) for the duration of the COVID-19 declaration under Se ction 564(b)(1) of the Act, 21 U.S.C. section 360bbb-3(b)(1), unless the authorization is terminated or revoked sooner.  Performed at [facility], [place] [address], [place], [place] [address]   Culture, blood (routine x 2)     Status: None (Preliminary result)   Collection Time: 07/06/21  7:21 PM   Specimen: Right Antecubital; Blood  Result Value Ref Range Status   Specimen Description   Final    RIGHT ANTECUBITAL Performed at [facility] [address], [place], [place] [address]    Special Requests   Final    BOTTLES DRAWN AEROBIC ONLY Blood Culture results may not be optimal due to an inadequate volume of blood received in culture bottles Performed at [facility]  [address] [place], [place] [address]    Culture   Final    NO GROWTH 4 DAYS Performed at [facility], [place] [address], [place], [place] [address]    Report Status  PENDING  Incomplete  Culture, blood (routine x 2)     Status: None (Preliminary result)   Collection Time: 07/06/21  7:21 PM   Specimen: BLOOD RIGHT HAND  Result Value Ref Range Status   Specimen Description   Final    BLOOD RIGHT HAND Performed at [facility] [address] [place], [place] [address]    Special Requests   Final    BOTTLES DRAWN AEROBIC ONLY Blood Culture adequate volume Performed at [facility] [address], [place], [place] [address]    Culture   Final    NO GROWTH 4 DAYS Performed at [facility], [place] [address], [place], [place] [address]    Report Status PENDING  Incomplete    Labs: CBC: Recent Labs  Lab 07/04/21 1528 07/05/21 0517 07/07/21 0724  WBC 9.3 8.4 7.7  HGB 14.7 12.1 12.9  HCT 44.1 35.2* 37.1  MCV 101.6* 98.9 96.9  PLT 236 215 811   Basic Metabolic Panel: Recent Labs  Lab 07/04/21 1528 07/05/21 0517 07/08/21 2237  NA 137 134* 134*  K 4.6 4.2 3.5  CL 107 110 105  CO2 25 20* 25  GLUCOSE 78 73 113*  BUN 6* 5* 8  CREATININE 0.59 0.51 0.44  CALCIUM 8.3* 7.7* 8.1*  MG  --   --  1.8   Liver Function Tests: Recent Labs  Lab 07/04/21 1528 07/05/21 0517 07/08/21 2237  AST 67* 50* 45*  ALT 24 20 20   ALKPHOS 119 90 105  BILITOT 1.6* 1.1 0.7  PROT 6.7 5.3* 5.6*  ALBUMIN 2.3* 1.8* 1.8*   CBG: No results for input(s): GLUCAP in the last 168 hours.  Discharge time spent: 35 minutes.  Signed: Cordelia Poche, MD [facility] 07/10/2021

## 2021-07-11 ENCOUNTER — Non-Acute Institutional Stay (SKILLED_NURSING_FACILITY): Payer: Medicare Other | Admitting: Adult Health

## 2021-07-11 DIAGNOSIS — F10939 Alcohol use, unspecified with withdrawal, unspecified: Secondary | ICD-10-CM

## 2021-07-11 DIAGNOSIS — I10 Essential (primary) hypertension: Secondary | ICD-10-CM

## 2021-07-11 DIAGNOSIS — E440 Moderate protein-calorie malnutrition: Secondary | ICD-10-CM

## 2021-07-11 DIAGNOSIS — R000 Tachycardia, unspecified: Secondary | ICD-10-CM

## 2021-07-11 DIAGNOSIS — J432 Centrilobular emphysema: Secondary | ICD-10-CM | POA: Diagnosis not present

## 2021-07-11 DIAGNOSIS — R7989 Other specified abnormal findings of blood chemistry: Secondary | ICD-10-CM | POA: Diagnosis not present

## 2021-07-11 DIAGNOSIS — R1312 Dysphagia, oropharyngeal phase: Secondary | ICD-10-CM | POA: Diagnosis not present

## 2021-07-11 DIAGNOSIS — E512 Wernicke's encephalopathy: Secondary | ICD-10-CM | POA: Diagnosis not present

## 2021-07-11 LAB — CULTURE, BLOOD (ROUTINE X 2)
Culture: NO GROWTH
Culture: NO GROWTH
Special Requests: ADEQUATE

## 2021-07-11 NOTE — Progress Notes (Signed)
Location:  [facility] Room Number: 307 A Place of Service:  SNF (31) Provider:  Durenda Age, DNP, FNP-BC  Patient Care Team: Billie Ruddy, MD as PCP - General (Family Medicine)  Extended Emergency Contact Information Primary Emergency Contact: [place] Mobile Phone: [phone] Relation: Son Secondary Emergency Contact: Borge,Tamieka Address: [address]          [place], [place] [address] [place] Mobile Phone: [phone] Relation: Niece  Code Status:   Full Code  Goals of care: Advanced Directive information Advanced Directives 07/02/2021  Does Patient Have a Medical Advance Directive? No  Would patient like information on creating a medical advance directive? No - Patient declined     Chief Complaint  Patient presents with   Hospitalization Follow-up    Admitted to [facility] on 07/10/21 post hospital admission  07/02/21 to 07/10/21    HPI:  Pt is a 71 y.o. female who was admitted to [facility] on 07/10/21 post hospital admission  07/02/21 to 07/10/21.  She has a PMH of hypertension, polycythemia, alcohol abuse and prior pancreatitis.  She presented to the hospital with concerns of weakness and recurrent falls.  CT head on 1/27 was negative.  She was recently referred to GI on 1/23 for issues of dysphagia and weight loss.  She has 2-3 alcoholic drinks per [facility]/58 pack/week. CT of the abdomen/pelvis showed mildly prominent common bile duct that is stable from prior exam.  There is also fatty infiltrate of the liver.  MRI is significant for mild increased T2 hyperintense signal in the bilateral mamillary bodies.  There was concern for Wernicke's encephalopathy hospital admission. She was given high-dose of thiamine with improvement and transitioned to thiamine 200 mg daily per neurology recommendation.  Sinus tachycardia was thought to be related to  alcohol withdrawal.  She was given IV fluids.  She was discharged with low-dose metoprolol and recommended outpatient cardiology follow-up.  She completed Librium scheduled taper and Ativan PRN per CIWA  She was seen today. She stated that she was hospitalized for an intervention.    Past Medical History:  Diagnosis Date   Hypertension    Polycythemia vera(238.4)    Past Surgical History:  Procedure Laterality Date   BRONCHIAL WASHINGS  07/16/2019   Procedure: BRONCHIAL WASHINGS;  Surgeon: Rigoberto Noel, MD;  Location: [facility] ENDOSCOPY;  Service: Cardiopulmonary;;   VIDEO BRONCHOSCOPY N/A 07/16/2019   Procedure: VIDEO BRONCHOSCOPY WITHOUT FLUORO;  Surgeon: Rigoberto Noel, MD;  Location: [facility];  Service: Cardiopulmonary;  Laterality: N/A;    No Known Allergies  Outpatient Encounter Medications as of 07/11/2021  Medication Sig   amLODipine (NORVASC) 5 MG tablet TAKE 1 TABLET(5 MG) BY MOUTH DAILY   Blood Pressure Monitoring (BLOOD PRESSURE CUFF) MISC Use daily as directed.   feeding supplement (ENSURE ENLIVE / ENSURE PLUS) LIQD Take 237 mLs by mouth 2 (two) times daily between meals.   folic acid (FOLVITE) 1 MG tablet Take 1 tablet (1 mg total) by mouth daily.   Ipratropium-Albuterol (COMBIVENT) 20-100 MCG/ACT AERS respimat Inhale 1 puff into the lungs in the morning and at bedtime. (Patient not taking: Reported on 07/02/2021)   metoprolol tartrate (LOPRESSOR) 25 MG tablet Take 0.5 tablets (12.5 mg total) by mouth 2 (two) times daily.   Multiple Vitamins-Minerals (MULTIVITAMIN ADULTS 50+) TABS Take 1 tablet by mouth daily.   omeprazole (PRILOSEC) 20 MG capsule Take 1 capsule (20 mg total)  by mouth daily.   SYRINGE-NEEDLE, DISP, 3 ML (BD ECLIPSE SYRINGE) 25G X 5/8" 3 ML MISC Use as directed   thiamine 100 MG tablet Take 2 tablets (200 mg total) by mouth daily.   umeclidinium bromide (INCRUSE ELLIPTA) 62.5 MCG/INH AEPB Inhale 1 puff into the lungs daily. (Patient not taking: Reported on  07/02/2021)   No facility-administered encounter medications on file as of 07/11/2021.    Review of Systems  Constitutional:  Negative for chills and fever.  HENT:  Negative for congestion, hearing loss, rhinorrhea and sore throat.   Eyes: Negative.   Respiratory:  Negative for cough, shortness of breath and wheezing.   Cardiovascular:  Negative for chest pain, palpitations and leg swelling.  Gastrointestinal:  Negative for abdominal pain, constipation, diarrhea, nausea and vomiting.  Genitourinary:  Negative for dysuria.  Musculoskeletal:  Negative for arthralgias, back pain and myalgias.  Skin:  Negative for color change, rash and wound.  Neurological:  Negative for dizziness and headaches.  Psychiatric/Behavioral:  Negative for behavioral problems. The patient is not nervous/anxious.        There is no immunization history on file for this patient. Pertinent  Health Maintenance Due  Topic Date Due   COLONOSCOPY (Pts 45-79yrs Insurance coverage will need to be confirmed)  Never done   MAMMOGRAM  Never done   DEXA SCAN  Never done   INFLUENZA VACCINE  Never done   Fall Risk 07/08/2021 07/08/2021 07/09/2021 07/09/2021 07/10/2021  Falls in the past year? - - - - -  Was there an injury with Fall? - - - - -  Fall Risk Category Calculator - - - - -  Fall Risk Category - - - - -  Patient Fall Risk Level High fall risk High fall risk High fall risk High fall risk High fall risk     Vitals:   07/11/21 1000  BP: 133/85  Pulse: 100  Resp: 18  Temp: 97.7 F (36.5 C)  Weight: 122 lb 6.4 oz (55.5 kg)  Height: 5\' 2"  (1.575 m)   Body mass index is 22.39 kg/m.  Physical Exam Constitutional:      General: She is not in acute distress.    Appearance: Normal appearance. She is normal weight.  HENT:     Head: Normocephalic and atraumatic.     Nose: Nose normal.     Mouth/Throat:     Mouth: Mucous membranes are moist.  Eyes:     Conjunctiva/sclera: Conjunctivae normal.  Cardiovascular:      Rate and Rhythm: Normal rate and regular rhythm.  Pulmonary:     Effort: Pulmonary effort is normal.     Breath sounds: Normal breath sounds.  Abdominal:     General: Bowel sounds are normal.     Palpations: Abdomen is soft.  Musculoskeletal:        General: No swelling.     Cervical back: Normal range of motion.  Skin:    General: Skin is warm and dry.  Neurological:     Mental Status: She is alert. Mental status is at baseline.     Comments: Alert to self and time, disoriented to place.  Psychiatric:        Mood and Affect: Mood normal.        Behavior: Behavior normal.       Labs reviewed: Recent Labs    07/03/21 0500 07/04/21 1528 07/05/21 0517 07/08/21 2237  NA 135 137 134* 134*  K 3.1* 4.6 4.2 3.5  CL 101 107 110 105  CO2 26 25 20* 25  GLUCOSE 94 78 73 113*  BUN 5* 6* 5* 8  CREATININE 0.54 0.59 0.51 0.44  CALCIUM 8.0* 8.3* 7.7* 8.1*  MG 1.5*  --   --  1.8   Recent Labs    07/04/21 1528 07/05/21 0517 07/08/21 2237  AST 67* 50* 45*  ALT 24 20 20   ALKPHOS 119 90 105  BILITOT 1.6* 1.1 0.7  PROT 6.7 5.3* 5.6*  ALBUMIN 2.3* 1.8* 1.8*   Recent Labs    06/15/21 1409 06/17/21 2111 07/02/21 1943 07/02/21 2019 07/04/21 1528 07/05/21 0517 07/07/21 0724  WBC 6.7   < > 8.4  --  9.3 8.4 7.7  NEUTROABS 4.1  --  4.1  --   --   --   --   HGB 13.5   < > 13.9   < > 14.7 12.1 12.9  HCT 41.2   < > 40.7   < > 44.1 35.2* 37.1  MCV 101.1*   < > 96.4  --  101.6* 98.9 96.9  PLT 178.0   < > 251  --  236 215 210   < > = values in this interval not displayed.   Lab Results  Component Value Date   TSH 1.52 06/15/2021   Lab Results  Component Value Date   HGBA1C 5.6 12/02/2019   Lab Results  Component Value Date   CHOL 146 06/15/2021   HDL 26.60 (L) 06/15/2021   LDLCALC 93 06/15/2021   TRIG 129.0 06/15/2021   CHOLHDL 5 06/15/2021    Significant Diagnostic Results in last 30 days:  CT HEAD WO CONTRAST (5MM)  Result Date: 07/02/2021 CLINICAL DATA:   Mental status change, unknown cause EXAM: CT HEAD WITHOUT CONTRAST TECHNIQUE: Contiguous axial images were obtained from the base of the skull through the vertex without intravenous contrast. RADIATION DOSE REDUCTION: This exam was performed according to the departmental dose-optimization program which includes automated exposure control, adjustment of the mA and/or kV according to patient size and/or use of iterative reconstruction technique. COMPARISON:  CT head 06/30/2021 FINDINGS: Brain: Patchy and confluent areas of decreased attenuation are noted throughout the deep and periventricular white matter of the cerebral hemispheres bilaterally, compatible with chronic microvascular ischemic disease. No evidence of large-territorial acute infarction. No parenchymal hemorrhage. No mass lesion. No extra-axial collection. No mass effect or midline shift. No hydrocephalus. Basilar cisterns are patent. Vascular: No hyperdense vessel. Atherosclerotic calcifications are present within the cavernous internal carotid arteries. Skull: No acute fracture or focal lesion. Old left lamina papyracea fracture. Sinuses/Orbits: Paranasal sinuses and mastoid air cells are clear. The orbits are unremarkable. Other: None. IMPRESSION: No acute intracranial abnormality. Electronically Signed   By: Iven Finn M.D.   On: 07/02/2021 22:38   CT HEAD WO CONTRAST (5MM)  Result Date: 06/30/2021 CLINICAL DATA:  Memory loss. Acute neurologic deficit. Unwitnessed fall 1 week ago. EXAM: CT HEAD WITHOUT CONTRAST TECHNIQUE: Contiguous axial images were obtained from the base of the skull through the vertex without intravenous contrast. RADIATION DOSE REDUCTION: This exam was performed according to the departmental dose-optimization program which includes automated exposure control, adjustment of the mA and/or kV according to patient size and/or use of iterative reconstruction technique. COMPARISON:  None. FINDINGS: Brain: The brainstem,  cerebellum, cerebral peduncles, thalami, basal ganglia, basilar cisterns, and ventricular system appear within normal limits. Periventricular white matter and corona radiata hypodensities favor chronic ischemic microvascular white matter disease. No intracranial hemorrhage, mass  lesion, or acute CVA. Vascular: There is atherosclerotic calcification of the cavernous carotid arteries bilaterally. Skull: Unremarkable Sinuses/Orbits: Old left medial orbital wall fracture. Mild chronic right posterior ethmoid sinusitis. Other: No supplemental non-categorized findings. IMPRESSION: 1. No acute intracranial findings. 2. Periventricular white matter and corona radiata hypodensities favor chronic ischemic microvascular white matter disease. 3. Atherosclerosis. 4. Old healed left medial orbital wall fracture. 5. Mild chronic right ethmoid sinusitis. Electronically Signed   By: Van Clines M.D.   On: 06/30/2021 12:31   MR BRAIN WO CONTRAST  Result Date: 07/03/2021 CLINICAL DATA:  Ataxia EXAM: MRI HEAD WITHOUT CONTRAST TECHNIQUE: Multiplanar, multiecho pulse sequences of the brain and surrounding structures were obtained without intravenous contrast. COMPARISON:  No prior MRI, correlation is made with CT head 07/02/2021 FINDINGS: Evaluation is limited by motion artifact. Brain: No restricted diffusion to suggest acute or subacute infarct. No acute hemorrhage, mass, mass effect, or midline shift. No foci of hemosiderin deposition to suggest remote hemorrhage. No hydrocephalus or extra-axial collection. Mild T2 hyperintense signal in the periventricular white matter, likely the sequela of chronic small vessel ischemic disease. Mildly increased T2 hyperintense signal in the mamillary bodies bilaterally (series 11, image 21); no increased T2 hyperintense signal along the third ventricle, in the dorsomedial thalami, or in the periaqueductal region. Vascular: Normal flow voids. Skull and upper cervical spine: Normal marrow  signal. Sinuses/Orbits: Mild mucosal thickening in the right maxillary sinus and right posterior ethmoid air cells. Right-greater-than-left exophthalmos. Remote left medial orbital fracture. Other: None. IMPRESSION: Mildly increased T2 hyperintense signal in the mamillary bodies bilaterally, as can be seen in the setting of Wernicke encephalopathy; however, no abnormal signal is seen in the other areas that can be typically involved. No other acute intracranial process. Electronically Signed   By: Merilyn Baba M.D.   On: 07/03/2021 19:27   CT ABDOMEN PELVIS W CONTRAST  Result Date: 07/02/2021 CLINICAL DATA:  Nausea and vomiting with abdominal pain EXAM: CT ABDOMEN AND PELVIS WITH CONTRAST TECHNIQUE: Multidetector CT imaging of the abdomen and pelvis was performed using the standard protocol following bolus administration of intravenous contrast. RADIATION DOSE REDUCTION: This exam was performed according to the departmental dose-optimization program which includes automated exposure control, adjustment of the mA and/or kV according to patient size and/or use of iterative reconstruction technique. CONTRAST:  120mL OMNIPAQUE IOHEXOL 300 MG/ML  SOLN COMPARISON:  07/14/2019 FINDINGS: Lower chest: No acute abnormality. Hepatobiliary: Fatty infiltration of the liver is noted. Gallbladder is within normal limits. Mild fullness of the common bile duct is noted although stable in appearance from the prior exam. Pancreas: Unremarkable. No pancreatic ductal dilatation or surrounding inflammatory changes. Spleen: Normal in size without focal abnormality. Adrenals/Urinary Tract: Adrenal glands are within normal limits. Kidneys demonstrate a normal enhancement pattern bilaterally. No renal calculi or obstructive changes are seen. Delayed images demonstrate normal excretion of contrast. The bladder is well distended. Stomach/Bowel: The appendix is within normal limits. No obstructive or inflammatory changes of the colon are  seen. Small bowel is within normal limits. Stomach is unremarkable. Vascular/Lymphatic: Aortic atherosclerosis. No enlarged abdominal or pelvic lymph nodes. Reproductive: Uterus and bilateral adnexa are unremarkable. Other: No abdominal wall hernia or abnormality. No abdominopelvic ascites. Musculoskeletal: No acute bony abnormality is noted. Degenerative changes of lumbar spine are seen. IMPRESSION: Fatty infiltration of the liver. Mildly prominent common bile duct but stable from the prior exam likely consistent with the patient's age. No acute abnormality noted. Electronically Signed   By: Inez Catalina  M.D.   On: 07/02/2021 21:05   DG CHEST PORT 1 VIEW  Result Date: 07/06/2021 CLINICAL DATA:  Tachypnea EXAM: PORTABLE CHEST 1 VIEW COMPARISON:  07/02/2021 FINDINGS: Single frontal view of the chest demonstrates a stable cardiac silhouette. Ectasia of the thoracic aorta. No acute airspace disease, effusion, or pneumothorax. No acute bony abnormalities. IMPRESSION: 1. Stable chest, no acute process. Electronically Signed   By: Randa Ngo M.D.   On: 07/06/2021 21:05   DG Chest Port 1 View  Result Date: 07/02/2021 CLINICAL DATA:  Shortness of breath. EXAM: PORTABLE CHEST 1 VIEW COMPARISON:  Chest x-ray 05/05/2020. FINDINGS: The heart size and mediastinal contours are within normal limits. Both lungs are clear. The visualized skeletal structures are unremarkable. IMPRESSION: No active disease. Electronically Signed   By: Ronney Asters M.D.   On: 07/02/2021 20:14    Assessment/Plan  1. Wernicke encephalopathy syndrome -   has 2-3 alcoholic drinks per [facility]/17 pack/week.    -   MRI is significant for mild increased T2 hyperintense signal in the bilateral mamillary bodies. -   was given high-dose of thiamine with improvement and transitioned to thiamine 200 mg daily per neurology recommendation -    Continue folic acid  2. Sinus tachycardia -   thought to be related to alcohol withdrawal.  She was given  IV fluids.  She was discharged with low-dose metoprolol and recommended outpatient cardiology follow-up  3. Alcohol withdrawal syndrome with complication ([place]) -   completed Librium scheduled taper and Ativan PRN per CIWA  4. Elevated LFTs Lab Results  Component Value Date   ALT 20 07/08/2021   AST 45 (H) 07/08/2021   ALKPHOS 105 07/08/2021   BILITOT 0.7 07/08/2021   -   Thought to be from alcohol use.  CT of the abdomen/pelvis showed mildly prominent common bile duct that is stable from prior exam.  There is also fatty infiltrate of the liver -   will monitor  5. Malnutrition of moderate degree Last metabolic panel Lab Results  Component Value Date   GLUCOSE 113 (H) 07/08/2021   NA 134 (L) 07/08/2021   K 3.5 07/08/2021   CL 105 07/08/2021   CO2 25 07/08/2021   BUN 8 07/08/2021   CREATININE 0.44 07/08/2021   GFRNONAA >60 07/08/2021   CALCIUM 8.1 (L) 07/08/2021   PHOS 4.6 07/14/2019   PROT 5.6 (L) 07/08/2021   ALBUMIN 1.8 (L) 07/08/2021   BILITOT 0.7 07/08/2021   ALKPHOS 105 07/08/2021   AST 45 (H) 07/08/2021   ALT 20 07/08/2021   ANIONGAP 4 (L) 07/08/2021   -   Continue to twice a day for supplementation  6. Oropharyngeal dysphagia -   for speech therapy evaluation and treatment -   Aspiration precautions -    Followed by [facility] with plan for barium swallow  7. Essential hypertension -   Continue amlodipine and metoprolol tartrate -    Monitor BPs  8. Centrilobular emphysema (HCC) -  no wheezing, continue Combivent Respimat inhaler and Incruse Ellipta    Family/ staff Communication: Discussed plan of care with resident and charge nurse.  Labs/tests ordered:    BMP, magnesium, vitamin B1, liver function test    Durenda Age, DNP, MSN, FNP-BC [place] [phone] (Monday-Friday 8:00 a.m. - 5:00 p.m.) [phone] (after hours)

## 2021-07-12 ENCOUNTER — Encounter: Payer: Self-pay | Admitting: Adult Health

## 2021-07-12 LAB — COMPREHENSIVE METABOLIC PANEL
Albumin: 2.4 — AB (ref 3.5–5.0)
Calcium: 8.4 — AB (ref 8.7–10.7)
eGFR: >90

## 2021-07-12 LAB — HEPATIC FUNCTION PANEL
ALT: 18 (ref 7–35)
AST: 47 — AB (ref 13–35)
Alkaline Phosphatase: 113 (ref 25–125)
Bilirubin, Direct: 0.3 (ref 0.01–0.4)
Bilirubin, Total: 1.1

## 2021-07-12 LAB — BASIC METABOLIC PANEL
BUN: 8 (ref 4–21)
CO2: 27 — AB (ref 13–22)
Chloride: 104 (ref 99–108)
Creatinine: 0.6 (ref 0.5–1.1)
Glucose: 98
Potassium: 4.3 (ref 3.4–5.3)
Sodium: 138 (ref 137–147)

## 2021-07-13 ENCOUNTER — Other Ambulatory Visit: Payer: Self-pay | Admitting: *Deleted

## 2021-07-13 ENCOUNTER — Non-Acute Institutional Stay (SKILLED_NURSING_FACILITY): Payer: Medicare Other | Admitting: Internal Medicine

## 2021-07-13 ENCOUNTER — Encounter: Payer: Self-pay | Admitting: Internal Medicine

## 2021-07-13 ENCOUNTER — Telehealth: Payer: Self-pay | Admitting: Gastroenterology

## 2021-07-13 DIAGNOSIS — R1312 Dysphagia, oropharyngeal phase: Secondary | ICD-10-CM | POA: Diagnosis not present

## 2021-07-13 DIAGNOSIS — R000 Tachycardia, unspecified: Secondary | ICD-10-CM | POA: Diagnosis not present

## 2021-07-13 DIAGNOSIS — E512 Wernicke's encephalopathy: Secondary | ICD-10-CM

## 2021-07-13 NOTE — Assessment & Plan Note (Signed)
Speech therapy will follow at SNF.

## 2021-07-13 NOTE — Assessment & Plan Note (Signed)
Pulse rates controlled on present dose of metoprolol.

## 2021-07-13 NOTE — Progress Notes (Signed)
NURSING HOME LOCATION:  [facility] ROOM NUMBER: 307  CODE STATUS: Full code  PCP: Grier Mitts, MD  This is a comprehensive admission note to this SNFperformed on this date less than 30 days from date of admission. Included are preadmission medical/surgical history; reconciled medication list; family history; social history and comprehensive review of systems.  Corrections and additions to the records were documented. Comprehensive physical exam was also performed. Additionally a clinical summary was entered for each active diagnosis pertinent to this admission in the Problem List to enhance continuity of care.  HPI: She was hospitalized 1/29-07/10/2021 with Wernicke encephalopathy syndrome in the context of alcohol withdrawal. She was managed on high-dose thiamine with clinical improvement.  Librium taper was prescribed for alcohol withdrawal symptoms.  MRI was significant for mild increased T2 hyperintense signal seen in bilateral mamillary bodies.  Thiamine/vitamin B1 was low at 47.8 (66.5-200).  High-dose thiamine was transitioned to 200 mg p.o. daily as per neurology recommendations.   Course was complicated by fever with associated tachycardia and soft blood pressure.  Work-up for infectious processes was negative.  Specifically chest x-ray was unremarkable, blood cultures negative, and white count normal. Tachycardia was attributed to alcohol withdrawal. Low-dose metoprolol was prescribed for the tachycardia.  TSH was therapeutic at 1.52.IV fluids addressed the soft blood pressures. D-dimer was 0.61 which was normal when corrected for age; Wells score indicated low risk of PTE.  She had no chest pain or dyspnea.  Hypoxia was attributed to underlying emphysema.   Ensure Plus high-protein was prescribed twice daily for protein/caloric malnutrition of moderate degree. Elevated LFTs were attributed to alcohol use syndrome.  Patient apparently stated she does not plan  to resume alcohol use. Dysphagia 3 mechanical soft diet with thin liquids was prescribed for chronic dysphagia for which she is followed by  GI. Labs were repeated at the SNF on 2/8.  Calcium was mildly reduced to 8.4.  Albumin had risen from 1.8 up to 2.4.  Total protein was 6.9.  AST was essentially stable at 47 and ALT 18.  Magnesium was slightly low at 1.8.  Past medical and surgical history: Includes essential hypertension, COPD, GERD, and polycythemia vera. Surgeries and procedures include bronchoscopy.  Social history: Alcohol abuse; insignificant smoking history according to the chart.  Sister and cousin states that she only drinks 2 cans of beer a day.  Apparently she smoked from roughly age 89 to age 63 up to 1.5 packs/day..  Family history: Entries refer to negative diagnoses among family members.   Review of systems: Clinical neurocognitive deficits made validity of responses questionable .Date given as "July 09, 2022".  POTUS identified as "Trump". When asked why she had been in the hospital she stated "I was coming in to visit somebody, friend".  She scored an 5 on the BIMS. According to her sister and cousin the patient had been independent but living with her son.  They did state that she had been falling intermittently. The patient denied any active symptoms until her sister described a cough.  At that point the patient stated that she did have a cough which was nonproductive over the last week.  She denies any shortness of breath or wheezing.  Her sister and cousin were unaware of the diagnosis of centrilobular emphysema and the prior history of right lower lobe collapse for which bronchoscopy was performed.    Constitutional: No fever, significant weight change, fatigue  Eyes: No redness, discharge, pain, vision change ENT/mouth: No  nasal congestion, purulent discharge, earache, change in hearing, sore throat  Cardiovascular: No chest pain, palpitations, paroxysmal  nocturnal dyspnea, claudication, edema  Respiratory: No hemoptysis, DOE, significant snoring, apnea  Gastrointestinal: No heartburn,abdominal pain, nausea /vomiting, rectal bleeding, melena, change in bowels Genitourinary: No dysuria, hematuria, pyuria, incontinence, nocturia Musculoskeletal: No joint stiffness, joint swelling, weakness, pain Dermatologic: No rash, pruritus, change in appearance of skin Neurologic: No dizziness, headache, syncope, seizures, numbness, tingling Psychiatric: No significant anxiety, depression, insomnia, anorexia Endocrine: No change in hair/skin/nails, excessive thirst, excessive hunger, excessive urination  Hematologic/lymphatic: No significant bruising, lymphadenopathy, abnormal bleeding Allergy/immunology: No itchy/watery eyes, significant sneezing, urticaria, angioedema  Physical exam:  Pertinent or positive findings: She is very lethargic and kept her eyes closed throughout the interview and exam.  Globes are prominent.  The maxilla is edentulous.  The mandibular teeth are markedly coated and stained with some malformation.  Slight tachycardia is present.  She has low-grade musical rhonchi bilaterally.  Abdomen is protuberant.  Pedal pulses are decreased.  She has trace edema at the sock line.  She is weak to opposition in all extremities.  General appearance: Adequately nourished; no acute distress, increased work of breathing is present.   Lymphatic: No lymphadenopathy about the head, neck, axilla. Eyes: No conjunctival inflammation or lid edema is present. There is no scleral icterus. Ears:  External ear exam shows no significant lesions or deformities.   Nose:  External nasal examination shows no deformity or inflammation. Nasal mucosa are pink and moist without lesions, exudates Neck:  No thyromegaly, masses, tenderness noted.    Heart:  No gallop, murmur, click, rub.  Lungs:  without wheezes, rales, rubs. Abdomen: Bowel sounds are normal.  Abdomen is  soft and nontender with no organomegaly, hernias, masses. GU: Deferred  Extremities:  No cyanosis, clubbing. Neurologic exam:  Balance, [place], finger to nose testing could not be completed due to clinical state Skin: Warm & dry w/o tenting. No significant lesions or rash.  See clinical summary under each active problem in the Problem List with associated updated therapeutic plan

## 2021-07-13 NOTE — Patient Outreach (Signed)
Per [place] eligible member resides in  [facility] and [place].  Screened for potential [facility] care coordination services as a benefit of [employer] plan.  [name] admitted to SNF on 07/10/21 after hospitalization.  Facility site visit to  [facility]. Met with [name] and sister/DPR Natasha Holland at bedside. Discussed [facility] services and transition plans. Natasha Holland reports transition plan is to return home. States [name]'s son Natasha Holland is primary contact and HCPOA. Confirms Natasha Holland as [phone].   Natasha Holland states [name] lives with her other son Edd Arbour. Discussed [name] has meals and transportation services available to her post SNF as a benefit of her [facility] [employer]. Natasha Holland asked that Probation officer contact son Natasha Holland to make aware. Natasha Holland endorses member will need transportation assist and meals post SNF.   Confirmed PCP is Dr. Volanda Napoleon with [facility] [place].  Discussed member's PCP at [facility] has [facility] care coordination team available if needed. CCM services would need to be ordered by PCP.  Provided [facility] Care Management brochure, 24-hr nurse advice line magnet, and writer's contact information.   Telephone call made to son/Natasha per request at [phone]. No answer. HIPAA compliant voicemail message left requesting return call.   Sent notification to SNF social worker to make aware writer is following transition plans and for [facility] needs.  Will continue to follow.    Natasha Rolling, MSN, Natasha Holland,Natasha Holland [place] Acute Care Coordinator [phone] [facility]) [phone]  (Toll free office)

## 2021-07-13 NOTE — Telephone Encounter (Signed)
°  Physician Name: Dr. Dustin Flock Contact: Morton Amy Physician Address: [address] [place], [place] [address] Phone Number: [phone]  Fax Number: [phone] Patient Name: Natasha Holland Patient Id: [id number] Insurance Carrier: UHC-MEDICARE Primary Diagnosis Code: R00.0 Description: Tachycardia, unspecified Secondary Diagnosis Code:  Description:  CPT Code 41423 Description: 3D RENDER W/O POSTPROCESSING Authorization Number: [id number] Review Date: 07/13/2021 10:59:27 AM Expiration Date: 2021-09-05  Codes 93306 and H6861 do not need auths.

## 2021-07-14 NOTE — Assessment & Plan Note (Addendum)
She scored 5 on BIMS suggesting severe neurocognitive deficits. BIMS (Brief Interview for Mental Status) score varies from 00-15 ,15 being normal. According to family she was independent prior to admission.  More detailed mental status exam (Ex. SLUMS or Montreal Cognitive Exam) can be performed by her PCP following discharge from rehab. Continue Thiamine

## 2021-07-14 NOTE — Patient Instructions (Signed)
See assessment and plan under each diagnosis in the problem list and acutely for this visit 

## 2021-07-17 DIAGNOSIS — R000 Tachycardia, unspecified: Secondary | ICD-10-CM | POA: Diagnosis not present

## 2021-07-17 DIAGNOSIS — E440 Moderate protein-calorie malnutrition: Secondary | ICD-10-CM | POA: Diagnosis not present

## 2021-07-17 DIAGNOSIS — R296 Repeated falls: Secondary | ICD-10-CM | POA: Diagnosis not present

## 2021-07-17 DIAGNOSIS — Z9181 History of falling: Secondary | ICD-10-CM | POA: Diagnosis not present

## 2021-07-17 DIAGNOSIS — E512 Wernicke's encephalopathy: Secondary | ICD-10-CM | POA: Diagnosis not present

## 2021-07-20 ENCOUNTER — Encounter: Payer: Self-pay | Admitting: Adult Health

## 2021-07-20 ENCOUNTER — Non-Acute Institutional Stay (SKILLED_NURSING_FACILITY): Payer: Medicare Other | Admitting: Adult Health

## 2021-07-20 DIAGNOSIS — J9601 Acute respiratory failure with hypoxia: Secondary | ICD-10-CM

## 2021-07-20 DIAGNOSIS — J432 Centrilobular emphysema: Secondary | ICD-10-CM | POA: Diagnosis not present

## 2021-07-20 DIAGNOSIS — J189 Pneumonia, unspecified organism: Secondary | ICD-10-CM | POA: Diagnosis not present

## 2021-07-20 DIAGNOSIS — I10 Essential (primary) hypertension: Secondary | ICD-10-CM

## 2021-07-20 NOTE — Progress Notes (Signed)
Location:  [facility] Room Number: 307-A Place of Service:  SNF (31) Provider:  Durenda Age, DNP, FNP-BC  Patient Care Team: Billie Ruddy, MD as PCP - General (Family Medicine)  Extended Emergency Contact Information Primary Emergency Contact: [place] Mobile Phone: [phone] Relation: Son Secondary Emergency Contact: Web,Tamieka Address: [address]          [place], [place] [address] [place] Mobile Phone: [phone] Relation: Niece  Code Status:  Full Code  Goals of care: Advanced Directive information Advanced Directives 07/20/2021  Does Patient Have a Medical Advance Directive? No  Would patient like information on creating a medical advance directive? No - Patient declined     Chief Complaint  Patient presents with   Acute Visit    Short term rehabilitation     HPI:  Natasha Holland is a 71 y.o. female seen today for short-term rehabilitation visit. She is currently having Natasha Holland and  OT for physical deconditioning post hospitalization 07/02/21 to 07/10/21. SBPs ranging from 105 to 133, with outlier 94. She takes Amlodipine 5 mg daily and Metoprolol tartrate 12.5 mg BID for hypertension. O2 sat dropped to 89% on room air so she was hooked to O2 @ 2L/min via [place]. O2 sat then went up to 93%. Chest x-ray revealed right lower lung consolidation. No coughing, fever nor chills reported. She continues to take Incruise Ellipta 62.5 mcg INH 1 puff daily and Combivent Respimat 20-100 mcg 1 puff BID for COPD. No wheezing.    Past Medical History:  Diagnosis Date   Hypertension    Polycythemia vera(238.4)    Past Surgical History:  Procedure Laterality Date   BRONCHIAL WASHINGS  07/16/2019   Procedure: BRONCHIAL WASHINGS;  Surgeon: Rigoberto Noel, MD;  Location: [facility] ENDOSCOPY;  Service: Cardiopulmonary;;   VIDEO BRONCHOSCOPY N/A 07/16/2019   Procedure: VIDEO BRONCHOSCOPY WITHOUT FLUORO;  Surgeon: Rigoberto Noel,  MD;  Location: [facility];  Service: Cardiopulmonary;  Laterality: N/A;    No Known Allergies  Outpatient Encounter Medications as of 07/20/2021  Medication Sig   amLODipine (NORVASC) 5 MG tablet TAKE 1 TABLET(5 MG) BY MOUTH DAILY   bisacodyl (DULCOLAX) 10 MG suppository If not relieved by MOM, give 10 mg Bisacodyl suppositiory rectally X 1 dose in 24 hours as needed   feeding supplement (ENSURE ENLIVE / ENSURE PLUS) LIQD Take 237 mLs by mouth 2 (two) times daily between meals.   folic acid (FOLVITE) 1 MG tablet Take 1 tablet (1 mg total) by mouth daily.   Ipratropium-Albuterol (COMBIVENT) 20-100 MCG/ACT AERS respimat Inhale 1 puff into the lungs in the morning and at bedtime.   Magnesium Hydroxide (MILK OF MAGNESIA PO) If no BM in 3 days, give 30 cc Milk of Magnesium p.o. x 1 dose in 24 hours as needed (Do not use standing constipation orders for residents with renal failure CFR less than 30. Contact MD for orders)   metoprolol tartrate (LOPRESSOR) 25 MG tablet Take 0.5 tablets (12.5 mg total) by mouth 2 (two) times daily.   Multiple Vitamins-Minerals (MULTIVITAMIN ADULTS 50+) TABS Take 1 tablet by mouth daily.   omeprazole (PRILOSEC) 20 MG capsule Take 1 capsule (20 mg total) by mouth daily.   Sodium Phosphates (RA SALINE ENEMA RE) If not relieved by Biscodyl suppository, give disposable Saline Enema rectally X 1 dose/24 hrs as needed   thiamine 100 MG tablet Take 2 tablets (200 mg total) by mouth daily.   umeclidinium bromide (  INCRUSE ELLIPTA) 62.5 MCG/INH AEPB Inhale 1 puff into the lungs daily.   Blood Pressure Monitoring (BLOOD PRESSURE CUFF) MISC Use daily as directed.   SYRINGE-NEEDLE, DISP, 3 ML (BD ECLIPSE SYRINGE) 25G X 5/8" 3 ML MISC Use as directed   No facility-administered encounter medications on file as of 07/20/2021.    Review of Systems  Constitutional:  Negative for appetite change, chills, fatigue and fever.  HENT:  Negative for congestion, hearing loss, rhinorrhea and  sore throat.   Eyes: Negative.   Respiratory:  Negative for cough, shortness of breath and wheezing.   Cardiovascular:  Negative for chest pain, palpitations and leg swelling.  Gastrointestinal:  Negative for abdominal pain, constipation, diarrhea, nausea and vomiting.  Genitourinary:  Negative for dysuria.  Musculoskeletal:  Negative for arthralgias, back pain and myalgias.  Skin:  Negative for color change, rash and wound.  Neurological:  Negative for dizziness, weakness and headaches.  Psychiatric/Behavioral:  Negative for behavioral problems. The patient is not nervous/anxious.       Immunization History  Administered Date(s) Administered   Fluad Quad(high Dose 65+) 02/02/2021   Pertinent  Health Maintenance Due  Topic Date Due   COLONOSCOPY (Pts 45-61yrs Insurance coverage will need to be confirmed)  Never done   MAMMOGRAM  Never done   DEXA SCAN  Never done   INFLUENZA VACCINE  Completed   Fall Risk 07/08/2021 07/08/2021 07/09/2021 07/09/2021 07/10/2021  Falls in the past year? - - - - -  Was there an injury with Fall? - - - - -  Fall Risk Category Calculator - - - - -  Fall Risk Category - - - - -  Patient Fall Risk Level High fall risk High fall risk High fall risk High fall risk High fall risk     Vitals:   07/20/21 1045  BP: 94/71  Pulse: 98  Resp: 18  Temp: (!) 97 F (36.1 C)  SpO2: 97%  Weight: 122 lb 3.2 oz (55.4 kg)  Height: 5\' 2"  (1.575 m)   Body mass index is 22.35 kg/m.  Physical Exam Constitutional:      General: She is not in acute distress.    Appearance: Normal appearance. She is normal weight.  HENT:     Head: Normocephalic and atraumatic.     Nose: Nose normal.     Mouth/Throat:     Mouth: Mucous membranes are moist.  Eyes:     Conjunctiva/sclera: Conjunctivae normal.  Cardiovascular:     Rate and Rhythm: Normal rate and regular rhythm.  Pulmonary:     Effort: Pulmonary effort is normal.     Breath sounds: Normal breath sounds.  Abdominal:      General: Bowel sounds are normal.     Palpations: Abdomen is soft.  Musculoskeletal:        General: Normal range of motion.  Skin:    General: Skin is warm and dry.  Neurological:     General: No focal deficit present.     Mental Status: She is alert. She is disoriented.     Comments: Alert to self, disoriented to time and place.  Psychiatric:        Mood and Affect: Mood normal.        Behavior: Behavior normal.       Labs reviewed: Recent Labs    07/03/21 0500 07/04/21 1528 07/05/21 0517 07/08/21 2237 07/12/21 0000  NA 135 137 134* 134* 138  K 3.1* 4.6 4.2 3.5 4.3  CL  101 107 110 105 104  CO2 26 25 20* 25 27*  GLUCOSE 94 78 73 113*  --   BUN 5* 6* 5* 8 8  CREATININE 0.54 0.59 0.51 0.44 0.6  CALCIUM 8.0* 8.3* 7.7* 8.1* 8.4*  MG 1.5*  --   --  1.8  --    Recent Labs    07/04/21 1528 07/05/21 0517 07/08/21 2237 07/12/21 0000  AST 67* 50* 45* 47*  ALT 24 20 20 18   ALKPHOS 119 90 105 113  BILITOT 1.6* 1.1 0.7  --   PROT 6.7 5.3* 5.6*  --   ALBUMIN 2.3* 1.8* 1.8* 2.4*   Recent Labs    06/15/21 1409 06/17/21 2111 07/02/21 1943 07/02/21 2019 07/04/21 1528 07/05/21 0517 07/07/21 0724  WBC 6.7   < > 8.4  --  9.3 8.4 7.7  NEUTROABS 4.1  --  4.1  --   --   --   --   HGB 13.5   < > 13.9   < > 14.7 12.1 12.9  HCT 41.2   < > 40.7   < > 44.1 35.2* 37.1  MCV 101.1*   < > 96.4  --  101.6* 98.9 96.9  PLT 178.0   < > 251  --  236 215 210   < > = values in this interval not displayed.   Lab Results  Component Value Date   TSH 1.52 06/15/2021   Lab Results  Component Value Date   HGBA1C 5.6 12/02/2019   Lab Results  Component Value Date   CHOL 146 06/15/2021   HDL 26.60 (L) 06/15/2021   LDLCALC 93 06/15/2021   TRIG 129.0 06/15/2021   CHOLHDL 5 06/15/2021    Significant Diagnostic Results in last 30 days:  CT HEAD WO CONTRAST (5MM)  Result Date: 07/02/2021 CLINICAL DATA:  Mental status change, unknown cause EXAM: CT HEAD WITHOUT CONTRAST  TECHNIQUE: Contiguous axial images were obtained from the base of the skull through the vertex without intravenous contrast. RADIATION DOSE REDUCTION: This exam was performed according to the departmental dose-optimization program which includes automated exposure control, adjustment of the mA and/or kV according to patient size and/or use of iterative reconstruction technique. COMPARISON:  CT head 06/30/2021 FINDINGS: Brain: Patchy and confluent areas of decreased attenuation are noted throughout the deep and periventricular white matter of the cerebral hemispheres bilaterally, compatible with chronic microvascular ischemic disease. No evidence of large-territorial acute infarction. No parenchymal hemorrhage. No mass lesion. No extra-axial collection. No mass effect or midline shift. No hydrocephalus. Basilar cisterns are patent. Vascular: No hyperdense vessel. Atherosclerotic calcifications are present within the cavernous internal carotid arteries. Skull: No acute fracture or focal lesion. Old left lamina papyracea fracture. Sinuses/Orbits: Paranasal sinuses and mastoid air cells are clear. The orbits are unremarkable. Other: None. IMPRESSION: No acute intracranial abnormality. Electronically Signed   By: Iven Finn M.D.   On: 07/02/2021 22:38   CT HEAD WO CONTRAST (5MM)  Result Date: 06/30/2021 CLINICAL DATA:  Memory loss. Acute neurologic deficit. Unwitnessed fall 1 week ago. EXAM: CT HEAD WITHOUT CONTRAST TECHNIQUE: Contiguous axial images were obtained from the base of the skull through the vertex without intravenous contrast. RADIATION DOSE REDUCTION: This exam was performed according to the departmental dose-optimization program which includes automated exposure control, adjustment of the mA and/or kV according to patient size and/or use of iterative reconstruction technique. COMPARISON:  None. FINDINGS: Brain: The brainstem, cerebellum, cerebral peduncles, thalami, basal ganglia, basilar cisterns,  and ventricular system  appear within normal limits. Periventricular white matter and corona radiata hypodensities favor chronic ischemic microvascular white matter disease. No intracranial hemorrhage, mass lesion, or acute CVA. Vascular: There is atherosclerotic calcification of the cavernous carotid arteries bilaterally. Skull: Unremarkable Sinuses/Orbits: Old left medial orbital wall fracture. Mild chronic right posterior ethmoid sinusitis. Other: No supplemental non-categorized findings. IMPRESSION: 1. No acute intracranial findings. 2. Periventricular white matter and corona radiata hypodensities favor chronic ischemic microvascular white matter disease. 3. Atherosclerosis. 4. Old healed left medial orbital wall fracture. 5. Mild chronic right ethmoid sinusitis. Electronically Signed   By: Van Clines M.D.   On: 06/30/2021 12:31   MR BRAIN WO CONTRAST  Result Date: 07/03/2021 CLINICAL DATA:  Ataxia EXAM: MRI HEAD WITHOUT CONTRAST TECHNIQUE: Multiplanar, multiecho pulse sequences of the brain and surrounding structures were obtained without intravenous contrast. COMPARISON:  No prior MRI, correlation is made with CT head 07/02/2021 FINDINGS: Evaluation is limited by motion artifact. Brain: No restricted diffusion to suggest acute or subacute infarct. No acute hemorrhage, mass, mass effect, or midline shift. No foci of hemosiderin deposition to suggest remote hemorrhage. No hydrocephalus or extra-axial collection. Mild T2 hyperintense signal in the periventricular white matter, likely the sequela of chronic small vessel ischemic disease. Mildly increased T2 hyperintense signal in the mamillary bodies bilaterally (series 11, image 21); no increased T2 hyperintense signal along the third ventricle, in the dorsomedial thalami, or in the periaqueductal region. Vascular: Normal flow voids. Skull and upper cervical spine: Normal marrow signal. Sinuses/Orbits: Mild mucosal thickening in the right maxillary  sinus and right posterior ethmoid air cells. Right-greater-than-left exophthalmos. Remote left medial orbital fracture. Other: None. IMPRESSION: Mildly increased T2 hyperintense signal in the mamillary bodies bilaterally, as can be seen in the setting of Wernicke encephalopathy; however, no abnormal signal is seen in the other areas that can be typically involved. No other acute intracranial process. Electronically Signed   By: Merilyn Baba M.D.   On: 07/03/2021 19:27   CT ABDOMEN PELVIS W CONTRAST  Result Date: 07/02/2021 CLINICAL DATA:  Nausea and vomiting with abdominal pain EXAM: CT ABDOMEN AND PELVIS WITH CONTRAST TECHNIQUE: Multidetector CT imaging of the abdomen and pelvis was performed using the standard protocol following bolus administration of intravenous contrast. RADIATION DOSE REDUCTION: This exam was performed according to the departmental dose-optimization program which includes automated exposure control, adjustment of the mA and/or kV according to patient size and/or use of iterative reconstruction technique. CONTRAST:  184mL OMNIPAQUE IOHEXOL 300 MG/ML  SOLN COMPARISON:  07/14/2019 FINDINGS: Lower chest: No acute abnormality. Hepatobiliary: Fatty infiltration of the liver is noted. Gallbladder is within normal limits. Mild fullness of the common bile duct is noted although stable in appearance from the prior exam. Pancreas: Unremarkable. No pancreatic ductal dilatation or surrounding inflammatory changes. Spleen: Normal in size without focal abnormality. Adrenals/Urinary Tract: Adrenal glands are within normal limits. Kidneys demonstrate a normal enhancement pattern bilaterally. No renal calculi or obstructive changes are seen. Delayed images demonstrate normal excretion of contrast. The bladder is well distended. Stomach/Bowel: The appendix is within normal limits. No obstructive or inflammatory changes of the colon are seen. Small bowel is within normal limits. Stomach is unremarkable.  Vascular/Lymphatic: Aortic atherosclerosis. No enlarged abdominal or pelvic lymph nodes. Reproductive: Uterus and bilateral adnexa are unremarkable. Other: No abdominal wall hernia or abnormality. No abdominopelvic ascites. Musculoskeletal: No acute bony abnormality is noted. Degenerative changes of lumbar spine are seen. IMPRESSION: Fatty infiltration of the liver. Mildly prominent common bile duct but stable  from the prior exam likely consistent with the patient's age. No acute abnormality noted. Electronically Signed   By: Inez Catalina M.D.   On: 07/02/2021 21:05   DG CHEST PORT 1 VIEW  Result Date: 07/06/2021 CLINICAL DATA:  Tachypnea EXAM: PORTABLE CHEST 1 VIEW COMPARISON:  07/02/2021 FINDINGS: Single frontal view of the chest demonstrates a stable cardiac silhouette. Ectasia of the thoracic aorta. No acute airspace disease, effusion, or pneumothorax. No acute bony abnormalities. IMPRESSION: 1. Stable chest, no acute process. Electronically Signed   By: Randa Ngo M.D.   On: 07/06/2021 21:05   DG Chest Port 1 View  Result Date: 07/02/2021 CLINICAL DATA:  Shortness of breath. EXAM: PORTABLE CHEST 1 VIEW COMPARISON:  Chest x-ray 05/05/2020. FINDINGS: The heart size and mediastinal contours are within normal limits. Both lungs are clear. The visualized skeletal structures are unremarkable. IMPRESSION: No active disease. Electronically Signed   By: Ronney Asters M.D.   On: 07/02/2021 20:14    Assessment/Plan  1. Acute respiratory failure with hypoxia (HCC) -  on O2 @ 2L/min via [place]  2. Pneumonia of right lower lobe due to infectious organism -  chest x-ray with right lower lobe consolidation -  will start on Doxycycline 100 mg BID X 10 day and Florastor 250 mg BID X 13 days  3. Essential hypertension -  continue Amlodipine and Metoprolol tartrate -  monitor BPs  4. Centrilobular emphysema ([place]) -   continue Combivent Respimat and Incruise Ellipta INH    Family/ staff Communication:  Discussed plan of care with resident and charge nurse.  Labs/tests ordered:  chest x-ray, CBC with differentials and BMP    Durenda Age, DNP, MSN, FNP-BC [place] [phone] (Monday-Friday 8:00 a.m. - 5:00 p.m.) [phone] (after hours)

## 2021-07-21 ENCOUNTER — Other Ambulatory Visit: Payer: Self-pay | Admitting: *Deleted

## 2021-07-21 ENCOUNTER — Encounter (HOSPITAL_COMMUNITY): Payer: Self-pay | Admitting: Gastroenterology

## 2021-07-21 ENCOUNTER — Ambulatory Visit (HOSPITAL_COMMUNITY): Payer: Medicare Other | Attending: Internal Medicine

## 2021-07-21 NOTE — Patient Outreach (Signed)
[facility] Coordinator follow up. [facility] eligible member screened for potential [facility] care coordination services as a benefit of member's insurance plan.  Member's PCP at [facility] [place] has [place] care coordination services available to member if needed post SNF.  Ms. Handyside remains in [facility]. Telephone call made to son Lin Givens [phone] to discuss [facility] follow up. Patient identifiers confirmed. Gerald Stabs reports member is somewhat getting better. However, she will need 24/7 care if she returns home. Gerald Stabs has not applied for Medicaid at this point. Encouraged him to apply and speak with someone at [facility] about facility Medicaid for long term care if Ms. Wroe is unable to return home.  Will continue to follow.   Marthenia Rolling, MSN, RN,BSN [facility] Acute Care Coordinator [phone] [facility]) [phone]  (Toll free office)

## 2021-07-25 ENCOUNTER — Non-Acute Institutional Stay (SKILLED_NURSING_FACILITY): Payer: Medicare Other | Admitting: Adult Health

## 2021-07-25 ENCOUNTER — Encounter: Payer: Self-pay | Admitting: Adult Health

## 2021-07-25 DIAGNOSIS — R000 Tachycardia, unspecified: Secondary | ICD-10-CM

## 2021-07-25 DIAGNOSIS — J189 Pneumonia, unspecified organism: Secondary | ICD-10-CM | POA: Diagnosis not present

## 2021-07-25 DIAGNOSIS — F419 Anxiety disorder, unspecified: Secondary | ICD-10-CM | POA: Diagnosis not present

## 2021-07-25 NOTE — Progress Notes (Signed)
Location:  [place] Room Number: 307-A Place of Service:  SNF (31) Provider:  Durenda Age, DNP, FNP-BC  Patient Care Team: Billie Ruddy, MD as PCP - General (Family Medicine)  Extended Emergency Contact Information Primary Emergency Contact: [place] Mobile Phone: [phone] Relation: Son Secondary Emergency Contact: Dobrowolski,Tamieka Address: [address]          [place], [place] [address] [place] Mobile Phone: [phone] Relation: Niece  Code Status:  FULL CODE  Goals of care: Advanced Directive information Advanced Directives 07/25/2021  Does Patient Have a Medical Advance Directive? No  Would patient like information on creating a medical advance directive? No - Patient declined     Chief Complaint  Patient presents with   Acute Visit    Tachycardia     HPI:  Pt is a 70 y.o. female seen today for an acute visit regarding elevated Heart rate. HRs ranging from 80 to 110. She denies having chest pain. She was hospitalized on 07/02/21 to 07/10/21 and was treated for Wernicke encephalopathy syndrome, alcohol withdrawal and sinus tachycardia. Amlodipine was recently discontinued due to soft BP. She is currently taking Metoprolol tartrate 12.5 mg BID. She was noted to be fidgeting with her right foot trying to get up. She has history of falls. She is currently on Doxycycline 100 mg BID for pneumonia. No reported fever nor chills.   Past Medical History:  Diagnosis Date   Hypertension    Polycythemia vera(238.4)    Past Surgical History:  Procedure Laterality Date   BRONCHIAL WASHINGS  07/16/2019   Procedure: BRONCHIAL WASHINGS;  Surgeon: Rigoberto Noel, MD;  Location: [facility] ENDOSCOPY;  Service: Cardiopulmonary;;   VIDEO BRONCHOSCOPY N/A 07/16/2019   Procedure: VIDEO BRONCHOSCOPY WITHOUT FLUORO;  Surgeon: Rigoberto Noel, MD;  Location: [place];  Service: Cardiopulmonary;  Laterality: N/A;     No Known Allergies  Outpatient Encounter Medications as of 07/25/2021  Medication Sig   bisacodyl (DULCOLAX) 10 MG suppository If not relieved by MOM, give 10 mg Bisacodyl suppositiory rectally X 1 dose in 24 hours as needed   Blood Pressure Monitoring (BLOOD PRESSURE CUFF) MISC Use daily as directed.   doxycycline (DORYX) 100 MG EC tablet Take 100 mg by mouth 2 (two) times daily.   feeding supplement (ENSURE ENLIVE / ENSURE PLUS) LIQD Take 237 mLs by mouth 2 (two) times daily between meals.   folic acid (FOLVITE) 1 MG tablet Take 1 tablet (1 mg total) by mouth daily.   Ipratropium-Albuterol (COMBIVENT) 20-100 MCG/ACT AERS respimat Inhale 1 puff into the lungs in the morning and at bedtime.   LORazepam (ATIVAN) 0.5 MG tablet Take 0.5 mg by mouth 2 (two) times daily as needed for anxiety.   Magnesium Hydroxide (MILK OF MAGNESIA PO) If no BM in 3 days, give 30 cc Milk of Magnesium p.o. x 1 dose in 24 hours as needed (Do not use standing constipation orders for residents with renal failure CFR less than 30. Contact MD for orders)   metoprolol tartrate (LOPRESSOR) 25 MG tablet Take 25 mg by mouth 2 (two) times daily.   Multiple Vitamins-Minerals (CENTRUM SILVER 50+WOMEN) TABS Take 1 tablet by mouth daily.   Nutritional Supplements (NUTRITIONAL SUPPLEMENT PO) Take 240 mLs by mouth in the morning and at bedtime. BOOST   omeprazole (PRILOSEC) 20 MG capsule Take 1 capsule (20 mg total) by mouth daily.   saccharomyces boulardii (FLORASTOR) 250 MG capsule Take 250 mg  by mouth 2 (two) times daily.   Sodium Phosphates (RA SALINE ENEMA RE) If not relieved by Biscodyl suppository, give disposable Saline Enema rectally X 1 dose/24 hrs as needed   SYRINGE-NEEDLE, DISP, 3 ML (BD ECLIPSE SYRINGE) 25G X 5/8" 3 ML MISC Use as directed   thiamine 100 MG tablet Take 2 tablets (200 mg total) by mouth daily.   umeclidinium bromide (INCRUSE ELLIPTA) 62.5 MCG/INH AEPB Inhale 1 puff into the lungs daily.    [DISCONTINUED] amLODipine (NORVASC) 5 MG tablet TAKE 1 TABLET(5 MG) BY MOUTH DAILY   [DISCONTINUED] metoprolol tartrate (LOPRESSOR) 25 MG tablet Take 0.5 tablets (12.5 mg total) by mouth 2 (two) times daily.   [DISCONTINUED] Multiple Vitamins-Minerals (MULTIVITAMIN ADULTS 50+) TABS Take 1 tablet by mouth daily.   No facility-administered encounter medications on file as of 07/25/2021.     Review of System  Unable to obtain due to Wernicke encephalopathy syndrome   Immunization History  Administered Date(s) Administered   DTaP 02/02/2014   Fluad Quad(high Dose 65+) 02/02/2021   Unspecified SARS-COV-2 Vaccination 08/03/2019, 09/03/2019   Zoster Recombinat (Shingrix) 03/04/2016   Pertinent  Health Maintenance Due  Topic Date Due   COLONOSCOPY (Pts 45-67yrs Insurance coverage will need to be confirmed)  Never done   MAMMOGRAM  Never done   DEXA SCAN  Never done   INFLUENZA VACCINE  Completed   Fall Risk 07/08/2021 07/08/2021 07/09/2021 07/09/2021 07/10/2021  Falls in the past year? - - - - -  Was there an injury with Fall? - - - - -  Fall Risk Category Calculator - - - - -  Fall Risk Category - - - - -  Patient Fall Risk Level High fall risk High fall risk High fall risk High fall risk High fall risk     Vitals:   07/25/21 1500  BP: 135/84  Pulse: (!) 104  Resp: 18  Temp: 97.7 F (36.5 C)  Weight: 117 lb 6.4 oz (53.3 kg)  Height: 5\' 2"  (1.575 m)   Body mass index is 21.47 kg/m.  Physical Exam Constitutional:      General: She is not in acute distress.    Appearance: Normal appearance.  HENT:     Head: Normocephalic and atraumatic.     Nose: Nose normal.     Mouth/Throat:     Mouth: Mucous membranes are moist.  Eyes:     Conjunctiva/sclera: Conjunctivae normal.  Cardiovascular:     Rate and Rhythm: Regular rhythm. Tachycardia present.  Pulmonary:     Effort: Pulmonary effort is normal.     Breath sounds: Normal breath sounds.  Abdominal:     General: Bowel sounds are  normal.     Palpations: Abdomen is soft.  Musculoskeletal:     Cervical back: Normal range of motion.  Skin:    General: Skin is warm and dry.  Neurological:     General: No focal deficit present.     Mental Status: She is alert. Mental status is at baseline. She is disoriented.  Psychiatric:     Comments: Anxious.       Labs reviewed: Recent Labs    07/03/21 0500 07/04/21 1528 07/05/21 0517 07/08/21 2237 07/12/21 0000  NA 135 137 134* 134* 138  K 3.1* 4.6 4.2 3.5 4.3  CL 101 107 110 105 104  CO2 26 25 20* 25 27*  GLUCOSE 94 78 73 113*  --   BUN 5* 6* 5* 8 8  CREATININE 0.54 0.59 0.51  0.44 0.6  CALCIUM 8.0* 8.3* 7.7* 8.1* 8.4*  MG 1.5*  --   --  1.8  --    Recent Labs    07/04/21 1528 07/05/21 0517 07/08/21 2237 07/12/21 0000  AST 67* 50* 45* 47*  ALT 24 20 20 18   ALKPHOS 119 90 105 113  BILITOT 1.6* 1.1 0.7  --   PROT 6.7 5.3* 5.6*  --   ALBUMIN 2.3* 1.8* 1.8* 2.4*   Recent Labs    06/15/21 1409 06/17/21 2111 07/02/21 1943 07/02/21 2019 07/04/21 1528 07/05/21 0517 07/07/21 0724  WBC 6.7   < > 8.4  --  9.3 8.4 7.7  NEUTROABS 4.1  --  4.1  --   --   --   --   HGB 13.5   < > 13.9   < > 14.7 12.1 12.9  HCT 41.2   < > 40.7   < > 44.1 35.2* 37.1  MCV 101.1*   < > 96.4  --  101.6* 98.9 96.9  PLT 178.0   < > 251  --  236 215 210   < > = values in this interval not displayed.   Lab Results  Component Value Date   TSH 1.52 06/15/2021   Lab Results  Component Value Date   HGBA1C 5.6 12/02/2019   Lab Results  Component Value Date   CHOL 146 06/15/2021   HDL 26.60 (L) 06/15/2021   LDLCALC 93 06/15/2021   TRIG 129.0 06/15/2021   CHOLHDL 5 06/15/2021    Significant Diagnostic Results in last 30 days:  CT HEAD WO CONTRAST (5MM)  Result Date: 07/02/2021 CLINICAL DATA:  Mental status change, unknown cause EXAM: CT HEAD WITHOUT CONTRAST TECHNIQUE: Contiguous axial images were obtained from the base of the skull through the vertex without intravenous  contrast. RADIATION DOSE REDUCTION: This exam was performed according to the departmental dose-optimization program which includes automated exposure control, adjustment of the mA and/or kV according to patient size and/or use of iterative reconstruction technique. COMPARISON:  CT head 06/30/2021 FINDINGS: Brain: Patchy and confluent areas of decreased attenuation are noted throughout the deep and periventricular white matter of the cerebral hemispheres bilaterally, compatible with chronic microvascular ischemic disease. No evidence of large-territorial acute infarction. No parenchymal hemorrhage. No mass lesion. No extra-axial collection. No mass effect or midline shift. No hydrocephalus. Basilar cisterns are patent. Vascular: No hyperdense vessel. Atherosclerotic calcifications are present within the cavernous internal carotid arteries. Skull: No acute fracture or focal lesion. Old left lamina papyracea fracture. Sinuses/Orbits: Paranasal sinuses and mastoid air cells are clear. The orbits are unremarkable. Other: None. IMPRESSION: No acute intracranial abnormality. Electronically Signed   By: Iven Finn M.D.   On: 07/02/2021 22:38   MR BRAIN WO CONTRAST  Result Date: 07/03/2021 CLINICAL DATA:  Ataxia EXAM: MRI HEAD WITHOUT CONTRAST TECHNIQUE: Multiplanar, multiecho pulse sequences of the brain and surrounding structures were obtained without intravenous contrast. COMPARISON:  No prior MRI, correlation is made with CT head 07/02/2021 FINDINGS: Evaluation is limited by motion artifact. Brain: No restricted diffusion to suggest acute or subacute infarct. No acute hemorrhage, mass, mass effect, or midline shift. No foci of hemosiderin deposition to suggest remote hemorrhage. No hydrocephalus or extra-axial collection. Mild T2 hyperintense signal in the periventricular white matter, likely the sequela of chronic small vessel ischemic disease. Mildly increased T2 hyperintense signal in the mamillary bodies  bilaterally (series 11, image 21); no increased T2 hyperintense signal along the third ventricle, in the dorsomedial thalami,  or in the periaqueductal region. Vascular: Normal flow voids. Skull and upper cervical spine: Normal marrow signal. Sinuses/Orbits: Mild mucosal thickening in the right maxillary sinus and right posterior ethmoid air cells. Right-greater-than-left exophthalmos. Remote left medial orbital fracture. Other: None. IMPRESSION: Mildly increased T2 hyperintense signal in the mamillary bodies bilaterally, as can be seen in the setting of Wernicke encephalopathy; however, no abnormal signal is seen in the other areas that can be typically involved. No other acute intracranial process. Electronically Signed   By: Merilyn Baba M.D.   On: 07/03/2021 19:27   CT ABDOMEN PELVIS W CONTRAST  Result Date: 07/02/2021 CLINICAL DATA:  Nausea and vomiting with abdominal pain EXAM: CT ABDOMEN AND PELVIS WITH CONTRAST TECHNIQUE: Multidetector CT imaging of the abdomen and pelvis was performed using the standard protocol following bolus administration of intravenous contrast. RADIATION DOSE REDUCTION: This exam was performed according to the departmental dose-optimization program which includes automated exposure control, adjustment of the mA and/or kV according to patient size and/or use of iterative reconstruction technique. CONTRAST:  165mL OMNIPAQUE IOHEXOL 300 MG/ML  SOLN COMPARISON:  07/14/2019 FINDINGS: Lower chest: No acute abnormality. Hepatobiliary: Fatty infiltration of the liver is noted. Gallbladder is within normal limits. Mild fullness of the common bile duct is noted although stable in appearance from the prior exam. Pancreas: Unremarkable. No pancreatic ductal dilatation or surrounding inflammatory changes. Spleen: Normal in size without focal abnormality. Adrenals/Urinary Tract: Adrenal glands are within normal limits. Kidneys demonstrate a normal enhancement pattern bilaterally. No renal  calculi or obstructive changes are seen. Delayed images demonstrate normal excretion of contrast. The bladder is well distended. Stomach/Bowel: The appendix is within normal limits. No obstructive or inflammatory changes of the colon are seen. Small bowel is within normal limits. Stomach is unremarkable. Vascular/Lymphatic: Aortic atherosclerosis. No enlarged abdominal or pelvic lymph nodes. Reproductive: Uterus and bilateral adnexa are unremarkable. Other: No abdominal wall hernia or abnormality. No abdominopelvic ascites. Musculoskeletal: No acute bony abnormality is noted. Degenerative changes of lumbar spine are seen. IMPRESSION: Fatty infiltration of the liver. Mildly prominent common bile duct but stable from the prior exam likely consistent with the patient's age. No acute abnormality noted. Electronically Signed   By: Inez Catalina M.D.   On: 07/02/2021 21:05   DG CHEST PORT 1 VIEW  Result Date: 07/06/2021 CLINICAL DATA:  Tachypnea EXAM: PORTABLE CHEST 1 VIEW COMPARISON:  07/02/2021 FINDINGS: Single frontal view of the chest demonstrates a stable cardiac silhouette. Ectasia of the thoracic aorta. No acute airspace disease, effusion, or pneumothorax. No acute bony abnormalities. IMPRESSION: 1. Stable chest, no acute process. Electronically Signed   By: Randa Ngo M.D.   On: 07/06/2021 21:05   DG Chest Port 1 View  Result Date: 07/02/2021 CLINICAL DATA:  Shortness of breath. EXAM: PORTABLE CHEST 1 VIEW COMPARISON:  Chest x-ray 05/05/2020. FINDINGS: The heart size and mediastinal contours are within normal limits. Both lungs are clear. The visualized skeletal structures are unremarkable. IMPRESSION: No active disease. Electronically Signed   By: Ronney Asters M.D.   On: 07/02/2021 20:14    Assessment/Plan  1. Sinus tachycardia -  will increase Metoprolol tartrate 12.5 mg BID to 25 mg BID -   cardiology consult  2. Anxiety -  will start on Ativan 0.5 mg BID PRN  -  Psych consult  3.  Pneumonia of right lower lobe due to infectious organism -  continue Doxycycline    Family/ staff Communication: Discussed plan of care with charge nurse.  Labs/tests  ordered:   None    Durenda Age, DNP, MSN, FNP-BC [facility] and Adult Medicine [phone] (Monday-Friday 8:00 a.m. - 5:00 p.m.) [phone] (after hours)

## 2021-07-26 ENCOUNTER — Other Ambulatory Visit: Payer: Self-pay | Admitting: *Deleted

## 2021-07-26 NOTE — Patient Outreach (Signed)
[place] Coordinator follow up. [employer] eligible member screened for potential [facility] care coordination services as a benefit of member's insurance plan. Ms.Donahey resides in [facility].  Member's PCP at [facility] has [place] care coordination services available to member if needed post SNF.  Telephone call made to [place] (son) [phone] to follow up on status of applying for Medicaid and transition plans. No answer. HIPAA compliant voicemail message left to request return call.   Will continue to follow.    Marthenia Rolling, MSN, RN,BSN [place] Acute [facility] Coordinator [phone] [facility]) [phone]  (Toll free office)

## 2021-08-01 DIAGNOSIS — E119 Type 2 diabetes mellitus without complications: Secondary | ICD-10-CM | POA: Diagnosis not present

## 2021-08-01 DIAGNOSIS — N390 Urinary tract infection, site not specified: Secondary | ICD-10-CM | POA: Diagnosis not present

## 2021-08-01 LAB — HEPATIC FUNCTION PANEL
ALT: 19 (ref 7–35)
AST: 39 — AB (ref 13–35)
Alkaline Phosphatase: 105 (ref 25–125)
Bilirubin, Total: 0.9

## 2021-08-01 LAB — BASIC METABOLIC PANEL
BUN: 9 (ref 4–21)
CO2: 26 — AB (ref 13–22)
Chloride: 106 (ref 99–108)
Creatinine: 0.6 (ref 0.5–1.1)
Glucose: 80
Potassium: 3.7 (ref 3.4–5.3)
Sodium: 144 (ref 137–147)

## 2021-08-01 LAB — COMPREHENSIVE METABOLIC PANEL
Albumin: 2.7 — AB (ref 3.5–5.0)
Calcium: 9.3 (ref 8.7–10.7)
Globulin: 4

## 2021-08-01 LAB — HEMOGLOBIN A1C: Hemoglobin A1C: 4.7

## 2021-08-04 ENCOUNTER — Encounter: Payer: Self-pay | Admitting: Adult Health

## 2021-08-04 ENCOUNTER — Non-Acute Institutional Stay (SKILLED_NURSING_FACILITY): Payer: Medicare Other | Admitting: Adult Health

## 2021-08-04 DIAGNOSIS — E512 Wernicke's encephalopathy: Secondary | ICD-10-CM

## 2021-08-04 DIAGNOSIS — M6281 Muscle weakness (generalized): Secondary | ICD-10-CM | POA: Diagnosis not present

## 2021-08-04 DIAGNOSIS — J432 Centrilobular emphysema: Secondary | ICD-10-CM | POA: Diagnosis not present

## 2021-08-04 DIAGNOSIS — R000 Tachycardia, unspecified: Secondary | ICD-10-CM | POA: Diagnosis not present

## 2021-08-04 DIAGNOSIS — M6259 Muscle wasting and atrophy, not elsewhere classified, multiple sites: Secondary | ICD-10-CM | POA: Diagnosis not present

## 2021-08-04 DIAGNOSIS — R2681 Unsteadiness on feet: Secondary | ICD-10-CM | POA: Diagnosis not present

## 2021-08-04 DIAGNOSIS — R2689 Other abnormalities of gait and mobility: Secondary | ICD-10-CM | POA: Diagnosis not present

## 2021-08-04 DIAGNOSIS — F419 Anxiety disorder, unspecified: Secondary | ICD-10-CM

## 2021-08-04 DIAGNOSIS — Z741 Need for assistance with personal care: Secondary | ICD-10-CM | POA: Diagnosis not present

## 2021-08-04 NOTE — Progress Notes (Signed)
Location:  [place] Room Number: 202-B Place of Service:  SNF (31) Provider:  Durenda Age, DNP, FNP-BC  Patient Care Team: Billie Ruddy, MD as PCP - General (Family Medicine)  Extended Emergency Contact Information Primary Emergency Contact: [place] Mobile Phone: [phone] Relation: Son Secondary Emergency Contact: [place],Tamieka Address: [address]          [place], [place] [address] [place] Mobile Phone: [phone] Relation: Niece  Code Status:  Full Code  Goals of care: Advanced Directive information Advanced Directives 08/04/2021  Does Patient Have a Medical Advance Directive? No  Would patient like information on creating a medical advance directive? No - Patient declined     Chief Complaint  Patient presents with   Acute Visit    Short term rehabilitation     HPI:  Pt is a 71 y.o. female seen today for short-term rehabilitation visit. She had been attempting to get up and had fall incidents. PT was initiated today.   Centrilobular emphysema (HCC)  -  No wheezing, she takes Combivent Respimat 20-100 mcg inhale 1 puff into the lungs BID and Incruse Ellipta 62.5 mcg inhale into the lungs daily  Wernicke encephalopathy syndrome -  has been having episodes trying to get up and fall, takes  Vitamin B1 100 mg 2 tabs = 458 mg daily and folic acid 1 mg 1 tab daily  Sinus tachycardia -  HRs ranging from 67 to 75, with outlier 106 and 110, takes Metoprolol tartrate 25 mg BID  Anxiety  -  takes Ativan 0.5 mg 1 tab BID PRN X 14 days    Past Medical History:  Diagnosis Date   Hypertension    Polycythemia vera(238.4)    Past Surgical History:  Procedure Laterality Date   BRONCHIAL WASHINGS  07/16/2019   Procedure: BRONCHIAL WASHINGS;  Surgeon: Rigoberto Noel, MD;  Location: [place];  Service: Cardiopulmonary;;   VIDEO BRONCHOSCOPY N/A 07/16/2019   Procedure: VIDEO BRONCHOSCOPY  WITHOUT FLUORO;  Surgeon: Rigoberto Noel, MD;  Location: [facility] ENDOSCOPY;  Service: Cardiopulmonary;  Laterality: N/A;    No Known Allergies  Outpatient Encounter Medications as of 08/04/2021  Medication Sig   bisacodyl (DULCOLAX) 10 MG suppository If not relieved by MOM, give 10 mg Bisacodyl suppositiory rectally X 1 dose in 24 hours as needed   feeding supplement (ENSURE ENLIVE / ENSURE PLUS) LIQD Take 237 mLs by mouth 2 (two) times daily between meals.   folic acid (FOLVITE) 1 MG tablet Take 1 tablet (1 mg total) by mouth daily.   Ipratropium-Albuterol (COMBIVENT) 20-100 MCG/ACT AERS respimat Inhale 1 puff into the lungs in the morning and at bedtime.   LORazepam (ATIVAN) 0.5 MG tablet Take 0.5 mg by mouth 2 (two) times daily as needed for anxiety.   Magnesium Hydroxide (MILK OF MAGNESIA PO) If no BM in 3 days, give 30 cc Milk of Magnesium p.o. x 1 dose in 24 hours as needed (Do not use standing constipation orders for residents with renal failure CFR less than 30. Contact MD for orders)   metoprolol tartrate (LOPRESSOR) 25 MG tablet Take 25 mg by mouth 2 (two) times daily.   Multiple Vitamins-Minerals (CENTRUM SILVER 50+WOMEN) TABS Take 1 tablet by mouth daily.   Nutritional Supplements (NUTRITIONAL SUPPLEMENT PO) Take 240 mLs by mouth in the morning and at bedtime. BOOST   Nutritional Supplements (NUTRITIONAL SUPPLEMENT PO) Take by mouth. MAGIC CUP TWICE DAILY DIETARY SUPPLEMEMT  omeprazole (PRILOSEC) 20 MG capsule Take 1 capsule (20 mg total) by mouth daily.   Sodium Phosphates (RA SALINE ENEMA RE) If not relieved by Biscodyl suppository, give disposable Saline Enema rectally X 1 dose/24 hrs as needed   thiamine 100 MG tablet Take 2 tablets (200 mg total) by mouth daily.   umeclidinium bromide (INCRUSE ELLIPTA) 62.5 MCG/INH AEPB Inhale 1 puff into the lungs daily.   Blood Pressure Monitoring (BLOOD PRESSURE CUFF) MISC Use daily as directed.   SYRINGE-NEEDLE, DISP, 3 ML (BD ECLIPSE SYRINGE)  25G X 5/8" 3 ML MISC Use as directed   [DISCONTINUED] doxycycline (DORYX) 100 MG EC tablet Take 100 mg by mouth 2 (two) times daily.   [DISCONTINUED] saccharomyces boulardii (FLORASTOR) 250 MG capsule Take 250 mg by mouth 2 (two) times daily.   No facility-administered encounter medications on file as of 08/04/2021.    Review of Systems  Constitutional:  Negative for appetite change, chills, fatigue and fever.  HENT:  Negative for congestion, hearing loss, rhinorrhea and sore throat.   Eyes: Negative.   Respiratory:  Negative for cough, shortness of breath and wheezing.   Cardiovascular:  Negative for chest pain, palpitations and leg swelling.  Gastrointestinal:  Negative for abdominal pain, constipation, diarrhea, nausea and vomiting.  Genitourinary:  Negative for dysuria.  Musculoskeletal:  Negative for arthralgias, back pain and myalgias.  Skin:  Negative for color change, rash and wound.  Neurological:  Negative for dizziness, weakness and headaches.  Psychiatric/Behavioral:  Negative for behavioral problems. The patient is not nervous/anxious.       Immunization History  Administered Date(s) Administered   DTaP 02/02/2014   Fluad Quad(high Dose 65+) 02/02/2021   Unspecified SARS-COV-2 Vaccination 08/03/2019, 09/03/2019   Zoster Recombinat (Shingrix) 03/04/2016   Pertinent  Health Maintenance Due  Topic Date Due   COLONOSCOPY (Pts 45-78yrs Insurance coverage will need to be confirmed)  Never done   MAMMOGRAM  Never done   DEXA SCAN  Never done   INFLUENZA VACCINE  Completed   Fall Risk 07/08/2021 07/08/2021 07/09/2021 07/09/2021 07/10/2021  Falls in the past year? - - - - -  Was there an injury with Fall? - - - - -  Fall Risk Category Calculator - - - - -  Fall Risk Category - - - - -  Patient Fall Risk Level High fall risk High fall risk High fall risk High fall risk High fall risk     Vitals:   08/04/21 1509  BP: 118/88  Pulse: (!) 108  Resp: 18  Temp: (!) 97.5 F (36.4  C)  SpO2: 95%  Weight: 120 lb 3.2 oz (54.5 kg)  Height: 5\' 2"  (1.575 m)   Body mass index is 21.98 kg/m.  Physical Exam Constitutional:      Appearance: Normal appearance.  HENT:     Head: Normocephalic and atraumatic.     Nose: Nose normal.     Mouth/Throat:     Mouth: Mucous membranes are moist.  Eyes:     Conjunctiva/sclera: Conjunctivae normal.  Cardiovascular:     Rate and Rhythm: Regular rhythm.     Pulses: Normal pulses.  Pulmonary:     Effort: Pulmonary effort is normal.     Breath sounds: Normal breath sounds.  Abdominal:     General: Bowel sounds are normal.     Palpations: Abdomen is soft.  Musculoskeletal:        General: Normal range of motion.     Cervical back: Normal range  of motion.  Skin:    General: Skin is warm and dry.  Neurological:     General: No focal deficit present.     Mental Status: She is alert.  Psychiatric:        Mood and Affect: Mood normal.     Labs reviewed: Recent Labs    07/03/21 0500 07/04/21 1528 07/05/21 0517 07/08/21 2237 07/12/21 0000 08/01/21 0000  NA 135 137 134* 134* 138 144  K 3.1* 4.6 4.2 3.5 4.3 3.7  CL 101 107 110 105 104 106  CO2 26 25 20* 25 27* 26*  GLUCOSE 94 78 73 113*  --   --   BUN 5* 6* 5* 8 8 9   CREATININE 0.54 0.59 0.51 0.44 0.6 0.6  CALCIUM 8.0* 8.3* 7.7* 8.1* 8.4* 9.3  MG 1.5*  --   --  1.8  --   --    Recent Labs    07/04/21 1528 07/05/21 0517 07/08/21 2237 07/12/21 0000 08/01/21 0000  AST 67* 50* 45* 47* 39*  ALT 24 20 20 18 19   ALKPHOS 119 90 105 113 105  BILITOT 1.6* 1.1 0.7  --   --   PROT 6.7 5.3* 5.6*  --   --   ALBUMIN 2.3* 1.8* 1.8* 2.4* 2.7*   Recent Labs    06/15/21 1409 06/17/21 2111 07/02/21 1943 07/02/21 2019 07/04/21 1528 07/05/21 0517 07/07/21 0724  WBC 6.7   < > 8.4  --  9.3 8.4 7.7  NEUTROABS 4.1  --  4.1  --   --   --   --   HGB 13.5   < > 13.9   < > 14.7 12.1 12.9  HCT 41.2   < > 40.7   < > 44.1 35.2* 37.1  MCV 101.1*   < > 96.4  --  101.6* 98.9 96.9   PLT 178.0   < > 251  --  236 215 210   < > = values in this interval not displayed.   Lab Results  Component Value Date   TSH 1.52 06/15/2021   Lab Results  Component Value Date   HGBA1C 4.7 08/01/2021   Lab Results  Component Value Date   CHOL 146 06/15/2021   HDL 26.60 (L) 06/15/2021   LDLCALC 93 06/15/2021   TRIG 129.0 06/15/2021   CHOLHDL 5 06/15/2021    Significant Diagnostic Results in last 30 days:  DG CHEST PORT 1 VIEW  Result Date: 07/06/2021 CLINICAL DATA:  Tachypnea EXAM: PORTABLE CHEST 1 VIEW COMPARISON:  07/02/2021 FINDINGS: Single frontal view of the chest demonstrates a stable cardiac silhouette. Ectasia of the thoracic aorta. No acute airspace disease, effusion, or pneumothorax. No acute bony abnormalities. IMPRESSION: 1. Stable chest, no acute process. Electronically Signed   By: Randa Ngo M.D.   On: 07/06/2021 21:05    Assessment/Plan  1. Centrilobular emphysema (HCC) -  No wheezing, continue Incruse Ellipta and Combivent  2. Wernicke encephalopathy syndrome -   Has episodes of confusion, continue vitamin D1 and folic acid  3. Sinus tachycardia -  HRs with occasional >100, continue Metoprolol tartrate  4. Anxiety -   has occasional anxiety, continue Ativan PRN    Family/ staff Communication: Discussed plan of care with resident and charge nurse.  Labs/tests ordered:   None    Durenda Age, DNP, MSN, FNP-BC [facility] and Adult Medicine [phone] (Monday-Friday 8:00 a.m. - 5:00 p.m.) [phone] (after hours)

## 2021-08-07 ENCOUNTER — Telehealth: Payer: Self-pay | Admitting: Family Medicine

## 2021-08-07 DIAGNOSIS — Z9181 History of falling: Secondary | ICD-10-CM | POA: Diagnosis not present

## 2021-08-07 DIAGNOSIS — M6259 Muscle wasting and atrophy, not elsewhere classified, multiple sites: Secondary | ICD-10-CM | POA: Diagnosis not present

## 2021-08-07 DIAGNOSIS — R000 Tachycardia, unspecified: Secondary | ICD-10-CM | POA: Diagnosis not present

## 2021-08-07 DIAGNOSIS — Z741 Need for assistance with personal care: Secondary | ICD-10-CM | POA: Diagnosis not present

## 2021-08-07 DIAGNOSIS — E440 Moderate protein-calorie malnutrition: Secondary | ICD-10-CM | POA: Diagnosis not present

## 2021-08-07 DIAGNOSIS — R2689 Other abnormalities of gait and mobility: Secondary | ICD-10-CM | POA: Diagnosis not present

## 2021-08-07 DIAGNOSIS — E512 Wernicke's encephalopathy: Secondary | ICD-10-CM | POA: Diagnosis not present

## 2021-08-07 DIAGNOSIS — R2681 Unsteadiness on feet: Secondary | ICD-10-CM | POA: Diagnosis not present

## 2021-08-07 DIAGNOSIS — R296 Repeated falls: Secondary | ICD-10-CM | POA: Diagnosis not present

## 2021-08-07 DIAGNOSIS — M6281 Muscle weakness (generalized): Secondary | ICD-10-CM | POA: Diagnosis not present

## 2021-08-07 NOTE — Telephone Encounter (Signed)
Left message for patient to call back and schedule Medicare Annual Wellness Visit (AWV) either virtually or in office. Left  my Herbie Drape number [phone] ? ? ?*awvi 04/04/16  per [facility]  ? please schedule at anytime with [facility] Nurse Health Advisor 1 or 2 ? ? ?This should be a 45 minute visit.  ?

## 2021-08-08 DIAGNOSIS — Z741 Need for assistance with personal care: Secondary | ICD-10-CM | POA: Diagnosis not present

## 2021-08-08 DIAGNOSIS — R2689 Other abnormalities of gait and mobility: Secondary | ICD-10-CM | POA: Diagnosis not present

## 2021-08-08 DIAGNOSIS — E119 Type 2 diabetes mellitus without complications: Secondary | ICD-10-CM | POA: Diagnosis not present

## 2021-08-08 DIAGNOSIS — M6259 Muscle wasting and atrophy, not elsewhere classified, multiple sites: Secondary | ICD-10-CM | POA: Diagnosis not present

## 2021-08-08 DIAGNOSIS — M6281 Muscle weakness (generalized): Secondary | ICD-10-CM | POA: Diagnosis not present

## 2021-08-08 DIAGNOSIS — R2681 Unsteadiness on feet: Secondary | ICD-10-CM | POA: Diagnosis not present

## 2021-08-09 DIAGNOSIS — R2689 Other abnormalities of gait and mobility: Secondary | ICD-10-CM | POA: Diagnosis not present

## 2021-08-09 DIAGNOSIS — M6281 Muscle weakness (generalized): Secondary | ICD-10-CM | POA: Diagnosis not present

## 2021-08-09 DIAGNOSIS — M79642 Pain in left hand: Secondary | ICD-10-CM | POA: Diagnosis not present

## 2021-08-09 DIAGNOSIS — M19042 Primary osteoarthritis, left hand: Secondary | ICD-10-CM | POA: Diagnosis not present

## 2021-08-09 DIAGNOSIS — R2681 Unsteadiness on feet: Secondary | ICD-10-CM | POA: Diagnosis not present

## 2021-08-09 DIAGNOSIS — M6259 Muscle wasting and atrophy, not elsewhere classified, multiple sites: Secondary | ICD-10-CM | POA: Diagnosis not present

## 2021-08-09 DIAGNOSIS — Z741 Need for assistance with personal care: Secondary | ICD-10-CM | POA: Diagnosis not present

## 2021-08-09 DIAGNOSIS — M25552 Pain in left hip: Secondary | ICD-10-CM | POA: Diagnosis not present

## 2021-08-09 DIAGNOSIS — M25551 Pain in right hip: Secondary | ICD-10-CM | POA: Diagnosis not present

## 2021-08-10 DIAGNOSIS — Z741 Need for assistance with personal care: Secondary | ICD-10-CM | POA: Diagnosis not present

## 2021-08-10 DIAGNOSIS — M6281 Muscle weakness (generalized): Secondary | ICD-10-CM | POA: Diagnosis not present

## 2021-08-10 DIAGNOSIS — M6259 Muscle wasting and atrophy, not elsewhere classified, multiple sites: Secondary | ICD-10-CM | POA: Diagnosis not present

## 2021-08-10 DIAGNOSIS — R2689 Other abnormalities of gait and mobility: Secondary | ICD-10-CM | POA: Diagnosis not present

## 2021-08-10 DIAGNOSIS — R2681 Unsteadiness on feet: Secondary | ICD-10-CM | POA: Diagnosis not present

## 2021-08-11 ENCOUNTER — Other Ambulatory Visit: Payer: Self-pay | Admitting: *Deleted

## 2021-08-11 DIAGNOSIS — M6259 Muscle wasting and atrophy, not elsewhere classified, multiple sites: Secondary | ICD-10-CM | POA: Diagnosis not present

## 2021-08-11 DIAGNOSIS — M6281 Muscle weakness (generalized): Secondary | ICD-10-CM | POA: Diagnosis not present

## 2021-08-11 DIAGNOSIS — Z741 Need for assistance with personal care: Secondary | ICD-10-CM | POA: Diagnosis not present

## 2021-08-11 DIAGNOSIS — R2689 Other abnormalities of gait and mobility: Secondary | ICD-10-CM | POA: Diagnosis not present

## 2021-08-11 DIAGNOSIS — R2681 Unsteadiness on feet: Secondary | ICD-10-CM | POA: Diagnosis not present

## 2021-08-11 NOTE — Patient Outreach (Signed)
[place] Coordinator follow up. [facility] eligible member screened for potential [facility] Care Management services as a benefit of member's insurance plan. Ms. Natasha Holland resides in [facility].  ? ?Telephone call made to son Natasha Holland [phone]. Natasha Holland states he applied for Medicaid and the plan is for Natasha Holland to remain in [place] for [place]. Natasha Holland expressed Scientist, research (life sciences) encouraging him to apply for Medicaid.  ? ?No identifiable [facility] needs at this time. ? ? ?Natasha Rolling, MSN, RN,BSN ?Natasha Holland Coordinator ?[phone] [facility]) ?[phone]  (Toll free office)   ?

## 2021-08-14 DIAGNOSIS — M6259 Muscle wasting and atrophy, not elsewhere classified, multiple sites: Secondary | ICD-10-CM | POA: Diagnosis not present

## 2021-08-14 DIAGNOSIS — M6281 Muscle weakness (generalized): Secondary | ICD-10-CM | POA: Diagnosis not present

## 2021-08-14 DIAGNOSIS — R2681 Unsteadiness on feet: Secondary | ICD-10-CM | POA: Diagnosis not present

## 2021-08-14 DIAGNOSIS — Z741 Need for assistance with personal care: Secondary | ICD-10-CM | POA: Diagnosis not present

## 2021-08-14 DIAGNOSIS — R2689 Other abnormalities of gait and mobility: Secondary | ICD-10-CM | POA: Diagnosis not present

## 2021-08-15 ENCOUNTER — Non-Acute Institutional Stay (SKILLED_NURSING_FACILITY): Payer: Medicare Other | Admitting: Adult Health

## 2021-08-15 ENCOUNTER — Encounter: Payer: Self-pay | Admitting: Adult Health

## 2021-08-15 DIAGNOSIS — Z741 Need for assistance with personal care: Secondary | ICD-10-CM | POA: Diagnosis not present

## 2021-08-15 DIAGNOSIS — F419 Anxiety disorder, unspecified: Secondary | ICD-10-CM | POA: Diagnosis not present

## 2021-08-15 DIAGNOSIS — R2689 Other abnormalities of gait and mobility: Secondary | ICD-10-CM | POA: Diagnosis not present

## 2021-08-15 DIAGNOSIS — R000 Tachycardia, unspecified: Secondary | ICD-10-CM

## 2021-08-15 DIAGNOSIS — J432 Centrilobular emphysema: Secondary | ICD-10-CM | POA: Diagnosis not present

## 2021-08-15 DIAGNOSIS — R2681 Unsteadiness on feet: Secondary | ICD-10-CM | POA: Diagnosis not present

## 2021-08-15 DIAGNOSIS — M6281 Muscle weakness (generalized): Secondary | ICD-10-CM | POA: Diagnosis not present

## 2021-08-15 DIAGNOSIS — M6259 Muscle wasting and atrophy, not elsewhere classified, multiple sites: Secondary | ICD-10-CM | POA: Diagnosis not present

## 2021-08-15 NOTE — Progress Notes (Signed)
? ?Location:  [facility] ?Nursing Home Room Number: 202-B ?Place of Service:  SNF (31) ?Provider:  Durenda Age, DNP, FNP-BC ? ?Patient Care Team: ?Billie Ruddy, MD as PCP - General (Family Medicine) ? ?Extended Emergency Contact Information ?Primary Emergency Contact: MASSY,CHRIS ? [place] ?Mobile Phone: [phone] ?Relation: Son ?Secondary Emergency Contact: Detweiler,Tamieka ?Address: [address] ?         [place], [place] [address] [place] ?Mobile Phone: [phone] ?Relation: Niece ? ?Code Status:  FULL CODE ? ?Goals of care: Advanced Directive information ?Advanced Directives 08/15/2021  ?Does Patient Have a Medical Advance Directive? No  ?Would patient like information on creating a medical advance directive? No - Patient declined  ? ? ? ?Chief Complaint  ?Patient presents with  ? Acute Visit  ?  Multiple Falls.  ? ? ?HPI:  ?Pt is a 71 y.o. female seen today for an acute visit regarding multiple falls. She was seen sitting on her wheelchair with right foot on the the floor and left foot on footrest. She gets anxious at times and tries to get up without asking for help. She denies pain. She has a BIMS score of 5/15, ranging in severe cognitive impairment. Incruse Ellipta, anticholinergic,  was recently discontinued per pharmacy recommendation. She takes Combivent Respimat 20-100 mcg 1 puff BID which is also an anticholinergic for her COPD. No reported SOB. ? ?She was admitted to [facility] on  07/10/21 post hospital admission 07/02/21 to 07/10/21.  She presented to the hospital with concerns of weakness and recurrent falls.  CT head on 1/27 was negative.  She was recently referred to GI on 1/23 for issues of dysphagia and weight loss.  She has 2-3 alcoholic drinks per ALP/37 pack/week.  CT of the abdomen/pelvis showed mildly prominent common bile duct that is stable from prior exam.  There is also fatty infiltrate of the liver.  MRI is  significant for mild increased T2 hyperintense signal in the bilateral mamillary bodies.  There was concern for Wernicke's encephalopathy.  She was given high-dose of thiamine with improvement and transition to thiamine 200 mg daily per neurology recommendation.  Sinus tachycardia was thought to be related to alcohol withdrawal while.  She was given IV fluids.  She was discharged with low-dose metoprolol and recommended outpatient cardiology follow-up.  She completed Librium scheduled taper and Ativan as needed for CIWA. ? ? ?Past Medical History:  ?Diagnosis Date  ? Hypertension   ? Polycythemia vera(238.4)   ? ?Past Surgical History:  ?Procedure Laterality Date  ? BRONCHIAL WASHINGS  07/16/2019  ? Procedure: BRONCHIAL WASHINGS;  Surgeon: Rigoberto Noel, MD;  Location: [facility];  Service: Cardiopulmonary;;  ? VIDEO BRONCHOSCOPY N/A 07/16/2019  ? Procedure: VIDEO BRONCHOSCOPY WITHOUT FLUORO;  Surgeon: Rigoberto Noel, MD;  Location: [facility];  Service: Cardiopulmonary;  Laterality: N/A;  ? ? ?No Known Allergies ? ?Outpatient Encounter Medications as of 08/15/2021  ?Medication Sig  ? acetaminophen (TYLENOL) 325 MG tablet Take 650 mg by mouth as needed (For pain.).  ? Albuterol Sulfate (PROAIR RESPICLICK) 902 (90 Base) MCG/ACT AEPB Inhale 1 puff into the lungs every 6 (six) hours as needed (For Wheezing.).  ? bisacodyl (DULCOLAX) 10 MG suppository If not relieved by MOM, give 10 mg Bisacodyl suppositiory rectally X 1 dose in 24 hours as needed  ? feeding supplement (ENSURE ENLIVE / ENSURE PLUS) LIQD Take 237 mLs by mouth 2 (two) times daily between meals.  ? folic  acid (FOLVITE) 1 MG tablet Take 1 tablet (1 mg total) by mouth daily.  ? Ipratropium-Albuterol (COMBIVENT) 20-100 MCG/ACT AERS respimat Inhale 1 puff into the lungs in the morning and at bedtime.  ? LORazepam (ATIVAN) 0.5 MG tablet Take 0.5 mg by mouth 2 (two) times daily as needed for anxiety.  ? Magnesium Hydroxide (MILK OF MAGNESIA PO) If no BM in 3  days, give 30 cc Milk of Magnesium p.o. x 1 dose in 24 hours as needed (Do not use standing constipation orders for residents with renal failure CFR less than 30. Contact MD for orders)  ? metoprolol tartrate (LOPRESSOR) 25 MG tablet Take 25 mg by mouth 2 (two) times daily.  ? Multiple Vitamins-Minerals (CENTRUM SILVER 50+WOMEN) TABS Take 1 tablet by mouth daily.  ? Nutritional Supplements (NUTRITIONAL SUPPLEMENT PO) Take 240 mLs by mouth in the morning and at bedtime. BOOST  ? Nutritional Supplements (NUTRITIONAL SUPPLEMENT PO) Take by mouth. MAGIC CUP TWICE DAILY DIETARY SUPPLEMEMT  ? omeprazole (PRILOSEC) 20 MG capsule Take 1 capsule (20 mg total) by mouth daily.  ? OXYGEN Inhale into the lungs.  ? Sodium Phosphates (RA SALINE ENEMA RE) If not relieved by Biscodyl suppository, give disposable Saline Enema rectally X 1 dose/24 hrs as needed  ? thiamine 100 MG tablet Take 2 tablets (200 mg total) by mouth daily.  ? Blood Pressure Monitoring (BLOOD PRESSURE CUFF) MISC Use daily as directed.  ? SYRINGE-NEEDLE, DISP, 3 ML (BD ECLIPSE SYRINGE) 25G X 5/8" 3 ML MISC Use as directed  ? [DISCONTINUED] umeclidinium bromide (INCRUSE ELLIPTA) 62.5 MCG/INH AEPB Inhale 1 puff into the lungs daily.  ? ?No facility-administered encounter medications on file as of 08/15/2021.  ? ? ?Review of Systems   Unable to obtain due to cognitive impairment. ? ? ? ?Immunization History  ?Administered Date(s) Administered  ? DTaP 02/02/2014  ? Fluad Quad(high Dose 65+) 02/02/2021  ? Unspecified SARS-COV-2 Vaccination 08/03/2019, 09/03/2019  ? Zoster Recombinat (Shingrix) 03/04/2016  ? ?Pertinent  Health Maintenance Due  ?Topic Date Due  ? COLONOSCOPY (Pts 45-67yr Insurance coverage will need to be confirmed)  Never done  ? MAMMOGRAM  Never done  ? DEXA SCAN  Never done  ? INFLUENZA VACCINE  Completed  ? ?Fall Risk 07/08/2021 07/08/2021 07/09/2021 07/09/2021 07/10/2021  ?Falls in the past year? - - - - -  ?Was there an injury with Fall? - - - - -  ?Fall  Risk Category Calculator - - - - -  ?Fall Risk Category - - - - -  ?Patient Fall Risk Level High fall risk High fall risk High fall risk High fall risk High fall risk  ? ? ? ?Vitals:  ? 08/15/21 1150  ?BP: 115/76  ?Pulse: (!) 109  ?Resp: 18  ?Temp: 99.1 ?F (37.3 ?C)  ?SpO2: 96%  ?Weight: 122 lb 6.4 oz (55.5 kg)  ?Height: '5\' 2"'$  (1.575 m)  ? ?Body mass index is 22.39 kg/m?. ? ?Physical Exam ?Constitutional:   ?   Appearance: Normal appearance.  ?HENT:  ?   Head: Normocephalic and atraumatic.  ?   Nose: Nose normal.  ?   Mouth/Throat:  ?   Mouth: Mucous membranes are moist.  ?Eyes:  ?   Conjunctiva/sclera: Conjunctivae normal.  ?Cardiovascular:  ?   Rate and Rhythm: Regular rhythm. Tachycardia present.  ?Pulmonary:  ?   Effort: Pulmonary effort is normal.  ?   Breath sounds: Normal breath sounds.  ?Abdominal:  ?   General: Bowel sounds  are normal.  ?   Palpations: Abdomen is soft.  ?Musculoskeletal:  ?   Cervical back: Normal range of motion.  ?Skin: ?   General: Skin is warm and dry.  ?Neurological:  ?   Mental Status: She is alert. She is disoriented.  ?Psychiatric:  ?   Comments: anxious  ?  ? ? ? ?Labs reviewed: ?Recent Labs  ?  07/03/21 ?0500 07/04/21 ?1528 07/05/21 ?1610 07/08/21 ?2237 07/12/21 ?0000 08/01/21 ?0000  ?NA 135 137 134* 134* 138 144  ?K 3.1* 4.6 4.2 3.5 4.3 3.7  ?CL 101 107 110 105 104 106  ?CO2 26 25 20* 25 27* 26*  ?GLUCOSE 94 78 73 113*  --   --   ?BUN 5* 6* 5* '8 8 9  '$ ?CREATININE 0.54 0.59 0.51 0.44 0.6 0.6  ?CALCIUM 8.0* 8.3* 7.7* 8.1* 8.4* 9.3  ?MG 1.5*  --   --  1.8  --   --   ? ?Recent Labs  ?  07/04/21 ?1528 07/05/21 ?9604 07/08/21 ?2237 07/12/21 ?0000 08/01/21 ?0000  ?AST 67* 50* 45* 47* 39*  ?ALT '24 20 20 18 19  '$ ?ALKPHOS 119 90 105 113 105  ?BILITOT 1.6* 1.1 0.7  --   --   ?PROT 6.7 5.3* 5.6*  --   --   ?ALBUMIN 2.3* 1.8* 1.8* 2.4* 2.7*  ? ?Recent Labs  ?  06/15/21 ?1409 06/17/21 ?2111 07/02/21 ?1943 07/02/21 ?2019 07/04/21 ?1528 07/05/21 ?5409 07/07/21 ?0724  ?WBC 6.7   < > 8.4  --  9.3  8.4 7.7  ?NEUTROABS 4.1  --  4.1  --   --   --   --   ?HGB 13.5   < > 13.9   < > 14.7 12.1 12.9  ?HCT 41.2   < > 40.7   < > 44.1 35.2* 37.1  ?MCV 101.1*   < > 96.4  --  101.6* 98.9 96.9  ?PLT 178.0   < > 25

## 2021-08-16 DIAGNOSIS — Z741 Need for assistance with personal care: Secondary | ICD-10-CM | POA: Diagnosis not present

## 2021-08-16 DIAGNOSIS — R2689 Other abnormalities of gait and mobility: Secondary | ICD-10-CM | POA: Diagnosis not present

## 2021-08-16 DIAGNOSIS — R000 Tachycardia, unspecified: Secondary | ICD-10-CM | POA: Diagnosis not present

## 2021-08-16 DIAGNOSIS — Z9181 History of falling: Secondary | ICD-10-CM | POA: Diagnosis not present

## 2021-08-16 DIAGNOSIS — E512 Wernicke's encephalopathy: Secondary | ICD-10-CM | POA: Diagnosis not present

## 2021-08-16 DIAGNOSIS — M6259 Muscle wasting and atrophy, not elsewhere classified, multiple sites: Secondary | ICD-10-CM | POA: Diagnosis not present

## 2021-08-16 DIAGNOSIS — R2681 Unsteadiness on feet: Secondary | ICD-10-CM | POA: Diagnosis not present

## 2021-08-16 DIAGNOSIS — R296 Repeated falls: Secondary | ICD-10-CM | POA: Diagnosis not present

## 2021-08-16 DIAGNOSIS — M6281 Muscle weakness (generalized): Secondary | ICD-10-CM | POA: Diagnosis not present

## 2021-08-16 DIAGNOSIS — E440 Moderate protein-calorie malnutrition: Secondary | ICD-10-CM | POA: Diagnosis not present

## 2021-08-17 DIAGNOSIS — R2681 Unsteadiness on feet: Secondary | ICD-10-CM | POA: Diagnosis not present

## 2021-08-17 DIAGNOSIS — M6259 Muscle wasting and atrophy, not elsewhere classified, multiple sites: Secondary | ICD-10-CM | POA: Diagnosis not present

## 2021-08-17 DIAGNOSIS — M6281 Muscle weakness (generalized): Secondary | ICD-10-CM | POA: Diagnosis not present

## 2021-08-17 DIAGNOSIS — R2689 Other abnormalities of gait and mobility: Secondary | ICD-10-CM | POA: Diagnosis not present

## 2021-08-17 DIAGNOSIS — Z741 Need for assistance with personal care: Secondary | ICD-10-CM | POA: Diagnosis not present

## 2021-08-18 DIAGNOSIS — M6281 Muscle weakness (generalized): Secondary | ICD-10-CM | POA: Diagnosis not present

## 2021-08-18 DIAGNOSIS — R2689 Other abnormalities of gait and mobility: Secondary | ICD-10-CM | POA: Diagnosis not present

## 2021-08-18 DIAGNOSIS — Z741 Need for assistance with personal care: Secondary | ICD-10-CM | POA: Diagnosis not present

## 2021-08-18 DIAGNOSIS — M6259 Muscle wasting and atrophy, not elsewhere classified, multiple sites: Secondary | ICD-10-CM | POA: Diagnosis not present

## 2021-08-18 DIAGNOSIS — R2681 Unsteadiness on feet: Secondary | ICD-10-CM | POA: Diagnosis not present

## 2021-08-20 MED ORDER — LORAZEPAM 0.5 MG PO TABS: 0.5000 mg | ORAL_TABLET | Freq: Two times a day (BID) | ORAL | 0 refills | Status: DC

## 2021-08-21 DIAGNOSIS — R2689 Other abnormalities of gait and mobility: Secondary | ICD-10-CM | POA: Diagnosis not present

## 2021-08-21 DIAGNOSIS — R2681 Unsteadiness on feet: Secondary | ICD-10-CM | POA: Diagnosis not present

## 2021-08-21 DIAGNOSIS — M6259 Muscle wasting and atrophy, not elsewhere classified, multiple sites: Secondary | ICD-10-CM | POA: Diagnosis not present

## 2021-08-21 DIAGNOSIS — M6281 Muscle weakness (generalized): Secondary | ICD-10-CM | POA: Diagnosis not present

## 2021-08-21 DIAGNOSIS — Z741 Need for assistance with personal care: Secondary | ICD-10-CM | POA: Diagnosis not present

## 2021-08-22 DIAGNOSIS — R2689 Other abnormalities of gait and mobility: Secondary | ICD-10-CM | POA: Diagnosis not present

## 2021-08-22 DIAGNOSIS — Z741 Need for assistance with personal care: Secondary | ICD-10-CM | POA: Diagnosis not present

## 2021-08-22 DIAGNOSIS — M6281 Muscle weakness (generalized): Secondary | ICD-10-CM | POA: Diagnosis not present

## 2021-08-22 DIAGNOSIS — M6259 Muscle wasting and atrophy, not elsewhere classified, multiple sites: Secondary | ICD-10-CM | POA: Diagnosis not present

## 2021-08-22 DIAGNOSIS — R2681 Unsteadiness on feet: Secondary | ICD-10-CM | POA: Diagnosis not present

## 2021-08-23 DIAGNOSIS — R2689 Other abnormalities of gait and mobility: Secondary | ICD-10-CM | POA: Diagnosis not present

## 2021-08-23 DIAGNOSIS — M6259 Muscle wasting and atrophy, not elsewhere classified, multiple sites: Secondary | ICD-10-CM | POA: Diagnosis not present

## 2021-08-23 DIAGNOSIS — M6281 Muscle weakness (generalized): Secondary | ICD-10-CM | POA: Diagnosis not present

## 2021-08-23 DIAGNOSIS — Z741 Need for assistance with personal care: Secondary | ICD-10-CM | POA: Diagnosis not present

## 2021-08-23 DIAGNOSIS — R2681 Unsteadiness on feet: Secondary | ICD-10-CM | POA: Diagnosis not present

## 2021-08-24 ENCOUNTER — Ambulatory Visit: Payer: Medicare Other | Admitting: Family Medicine

## 2021-08-24 DIAGNOSIS — M6281 Muscle weakness (generalized): Secondary | ICD-10-CM | POA: Diagnosis not present

## 2021-08-24 DIAGNOSIS — Z741 Need for assistance with personal care: Secondary | ICD-10-CM | POA: Diagnosis not present

## 2021-08-24 DIAGNOSIS — M6259 Muscle wasting and atrophy, not elsewhere classified, multiple sites: Secondary | ICD-10-CM | POA: Diagnosis not present

## 2021-08-24 DIAGNOSIS — R2689 Other abnormalities of gait and mobility: Secondary | ICD-10-CM | POA: Diagnosis not present

## 2021-08-24 DIAGNOSIS — R2681 Unsteadiness on feet: Secondary | ICD-10-CM | POA: Diagnosis not present

## 2021-08-25 DIAGNOSIS — M6281 Muscle weakness (generalized): Secondary | ICD-10-CM | POA: Diagnosis not present

## 2021-08-25 DIAGNOSIS — Z741 Need for assistance with personal care: Secondary | ICD-10-CM | POA: Diagnosis not present

## 2021-08-25 DIAGNOSIS — R2689 Other abnormalities of gait and mobility: Secondary | ICD-10-CM | POA: Diagnosis not present

## 2021-08-25 DIAGNOSIS — R2681 Unsteadiness on feet: Secondary | ICD-10-CM | POA: Diagnosis not present

## 2021-08-25 DIAGNOSIS — M6259 Muscle wasting and atrophy, not elsewhere classified, multiple sites: Secondary | ICD-10-CM | POA: Diagnosis not present

## 2021-08-28 DIAGNOSIS — R2681 Unsteadiness on feet: Secondary | ICD-10-CM | POA: Diagnosis not present

## 2021-08-28 DIAGNOSIS — Z741 Need for assistance with personal care: Secondary | ICD-10-CM | POA: Diagnosis not present

## 2021-08-28 DIAGNOSIS — R2689 Other abnormalities of gait and mobility: Secondary | ICD-10-CM | POA: Diagnosis not present

## 2021-08-28 DIAGNOSIS — M6259 Muscle wasting and atrophy, not elsewhere classified, multiple sites: Secondary | ICD-10-CM | POA: Diagnosis not present

## 2021-08-28 DIAGNOSIS — M6281 Muscle weakness (generalized): Secondary | ICD-10-CM | POA: Diagnosis not present

## 2021-08-29 DIAGNOSIS — M6281 Muscle weakness (generalized): Secondary | ICD-10-CM | POA: Diagnosis not present

## 2021-08-29 DIAGNOSIS — M6259 Muscle wasting and atrophy, not elsewhere classified, multiple sites: Secondary | ICD-10-CM | POA: Diagnosis not present

## 2021-08-29 DIAGNOSIS — Z741 Need for assistance with personal care: Secondary | ICD-10-CM | POA: Diagnosis not present

## 2021-08-29 DIAGNOSIS — R2681 Unsteadiness on feet: Secondary | ICD-10-CM | POA: Diagnosis not present

## 2021-08-29 DIAGNOSIS — R2689 Other abnormalities of gait and mobility: Secondary | ICD-10-CM | POA: Diagnosis not present

## 2021-08-30 DIAGNOSIS — R2689 Other abnormalities of gait and mobility: Secondary | ICD-10-CM | POA: Diagnosis not present

## 2021-08-30 DIAGNOSIS — M6281 Muscle weakness (generalized): Secondary | ICD-10-CM | POA: Diagnosis not present

## 2021-08-30 DIAGNOSIS — Z741 Need for assistance with personal care: Secondary | ICD-10-CM | POA: Diagnosis not present

## 2021-08-30 DIAGNOSIS — R2681 Unsteadiness on feet: Secondary | ICD-10-CM | POA: Diagnosis not present

## 2021-08-30 DIAGNOSIS — M6259 Muscle wasting and atrophy, not elsewhere classified, multiple sites: Secondary | ICD-10-CM | POA: Diagnosis not present

## 2021-08-31 DIAGNOSIS — M6259 Muscle wasting and atrophy, not elsewhere classified, multiple sites: Secondary | ICD-10-CM | POA: Diagnosis not present

## 2021-08-31 DIAGNOSIS — Z741 Need for assistance with personal care: Secondary | ICD-10-CM | POA: Diagnosis not present

## 2021-08-31 DIAGNOSIS — R2689 Other abnormalities of gait and mobility: Secondary | ICD-10-CM | POA: Diagnosis not present

## 2021-08-31 DIAGNOSIS — R2681 Unsteadiness on feet: Secondary | ICD-10-CM | POA: Diagnosis not present

## 2021-08-31 DIAGNOSIS — M6281 Muscle weakness (generalized): Secondary | ICD-10-CM | POA: Diagnosis not present

## 2021-09-01 DIAGNOSIS — M6281 Muscle weakness (generalized): Secondary | ICD-10-CM | POA: Diagnosis not present

## 2021-09-01 DIAGNOSIS — Z741 Need for assistance with personal care: Secondary | ICD-10-CM | POA: Diagnosis not present

## 2021-09-01 DIAGNOSIS — R2689 Other abnormalities of gait and mobility: Secondary | ICD-10-CM | POA: Diagnosis not present

## 2021-09-01 DIAGNOSIS — M6259 Muscle wasting and atrophy, not elsewhere classified, multiple sites: Secondary | ICD-10-CM | POA: Diagnosis not present

## 2021-09-01 DIAGNOSIS — R2681 Unsteadiness on feet: Secondary | ICD-10-CM | POA: Diagnosis not present

## 2021-09-02 DIAGNOSIS — Z741 Need for assistance with personal care: Secondary | ICD-10-CM | POA: Diagnosis not present

## 2021-09-02 DIAGNOSIS — R2681 Unsteadiness on feet: Secondary | ICD-10-CM | POA: Diagnosis not present

## 2021-09-02 DIAGNOSIS — M6281 Muscle weakness (generalized): Secondary | ICD-10-CM | POA: Diagnosis not present

## 2021-09-02 DIAGNOSIS — R1311 Dysphagia, oral phase: Secondary | ICD-10-CM | POA: Diagnosis not present

## 2021-09-02 DIAGNOSIS — M6259 Muscle wasting and atrophy, not elsewhere classified, multiple sites: Secondary | ICD-10-CM | POA: Diagnosis not present

## 2021-09-02 DIAGNOSIS — E512 Wernicke's encephalopathy: Secondary | ICD-10-CM | POA: Diagnosis not present

## 2021-09-02 DIAGNOSIS — R2689 Other abnormalities of gait and mobility: Secondary | ICD-10-CM | POA: Diagnosis not present

## 2021-09-05 DIAGNOSIS — Z741 Need for assistance with personal care: Secondary | ICD-10-CM | POA: Diagnosis not present

## 2021-09-05 DIAGNOSIS — M6259 Muscle wasting and atrophy, not elsewhere classified, multiple sites: Secondary | ICD-10-CM | POA: Diagnosis not present

## 2021-09-05 DIAGNOSIS — R2689 Other abnormalities of gait and mobility: Secondary | ICD-10-CM | POA: Diagnosis not present

## 2021-09-05 DIAGNOSIS — E512 Wernicke's encephalopathy: Secondary | ICD-10-CM | POA: Diagnosis not present

## 2021-09-05 DIAGNOSIS — R2681 Unsteadiness on feet: Secondary | ICD-10-CM | POA: Diagnosis not present

## 2021-09-05 DIAGNOSIS — M6281 Muscle weakness (generalized): Secondary | ICD-10-CM | POA: Diagnosis not present

## 2021-09-05 DIAGNOSIS — R1311 Dysphagia, oral phase: Secondary | ICD-10-CM | POA: Diagnosis not present

## 2021-09-06 DIAGNOSIS — R2689 Other abnormalities of gait and mobility: Secondary | ICD-10-CM | POA: Diagnosis not present

## 2021-09-06 DIAGNOSIS — R1311 Dysphagia, oral phase: Secondary | ICD-10-CM | POA: Diagnosis not present

## 2021-09-06 DIAGNOSIS — M6259 Muscle wasting and atrophy, not elsewhere classified, multiple sites: Secondary | ICD-10-CM | POA: Diagnosis not present

## 2021-09-06 DIAGNOSIS — R2681 Unsteadiness on feet: Secondary | ICD-10-CM | POA: Diagnosis not present

## 2021-09-06 DIAGNOSIS — M6281 Muscle weakness (generalized): Secondary | ICD-10-CM | POA: Diagnosis not present

## 2021-09-06 DIAGNOSIS — Z741 Need for assistance with personal care: Secondary | ICD-10-CM | POA: Diagnosis not present

## 2021-09-06 DIAGNOSIS — E512 Wernicke's encephalopathy: Secondary | ICD-10-CM | POA: Diagnosis not present

## 2021-09-07 DIAGNOSIS — E512 Wernicke's encephalopathy: Secondary | ICD-10-CM | POA: Diagnosis not present

## 2021-09-07 DIAGNOSIS — Z741 Need for assistance with personal care: Secondary | ICD-10-CM | POA: Diagnosis not present

## 2021-09-07 DIAGNOSIS — R2689 Other abnormalities of gait and mobility: Secondary | ICD-10-CM | POA: Diagnosis not present

## 2021-09-07 DIAGNOSIS — M6281 Muscle weakness (generalized): Secondary | ICD-10-CM | POA: Diagnosis not present

## 2021-09-07 DIAGNOSIS — R1311 Dysphagia, oral phase: Secondary | ICD-10-CM | POA: Diagnosis not present

## 2021-09-07 DIAGNOSIS — R2681 Unsteadiness on feet: Secondary | ICD-10-CM | POA: Diagnosis not present

## 2021-09-07 DIAGNOSIS — M6259 Muscle wasting and atrophy, not elsewhere classified, multiple sites: Secondary | ICD-10-CM | POA: Diagnosis not present

## 2021-09-08 DIAGNOSIS — M6281 Muscle weakness (generalized): Secondary | ICD-10-CM | POA: Diagnosis not present

## 2021-09-08 DIAGNOSIS — R2689 Other abnormalities of gait and mobility: Secondary | ICD-10-CM | POA: Diagnosis not present

## 2021-09-08 DIAGNOSIS — E512 Wernicke's encephalopathy: Secondary | ICD-10-CM | POA: Diagnosis not present

## 2021-09-08 DIAGNOSIS — Z741 Need for assistance with personal care: Secondary | ICD-10-CM | POA: Diagnosis not present

## 2021-09-08 DIAGNOSIS — M6259 Muscle wasting and atrophy, not elsewhere classified, multiple sites: Secondary | ICD-10-CM | POA: Diagnosis not present

## 2021-09-08 DIAGNOSIS — R2681 Unsteadiness on feet: Secondary | ICD-10-CM | POA: Diagnosis not present

## 2021-09-08 DIAGNOSIS — R1311 Dysphagia, oral phase: Secondary | ICD-10-CM | POA: Diagnosis not present

## 2021-09-09 ENCOUNTER — Other Ambulatory Visit: Payer: Self-pay | Admitting: Family Medicine

## 2021-09-09 DIAGNOSIS — I10 Essential (primary) hypertension: Secondary | ICD-10-CM

## 2021-09-11 DIAGNOSIS — M6259 Muscle wasting and atrophy, not elsewhere classified, multiple sites: Secondary | ICD-10-CM | POA: Diagnosis not present

## 2021-09-11 DIAGNOSIS — R2681 Unsteadiness on feet: Secondary | ICD-10-CM | POA: Diagnosis not present

## 2021-09-11 DIAGNOSIS — R1311 Dysphagia, oral phase: Secondary | ICD-10-CM | POA: Diagnosis not present

## 2021-09-11 DIAGNOSIS — M6281 Muscle weakness (generalized): Secondary | ICD-10-CM | POA: Diagnosis not present

## 2021-09-11 DIAGNOSIS — E512 Wernicke's encephalopathy: Secondary | ICD-10-CM | POA: Diagnosis not present

## 2021-09-11 DIAGNOSIS — R2689 Other abnormalities of gait and mobility: Secondary | ICD-10-CM | POA: Diagnosis not present

## 2021-09-11 DIAGNOSIS — Z741 Need for assistance with personal care: Secondary | ICD-10-CM | POA: Diagnosis not present

## 2021-09-12 DIAGNOSIS — R2681 Unsteadiness on feet: Secondary | ICD-10-CM | POA: Diagnosis not present

## 2021-09-12 DIAGNOSIS — R1311 Dysphagia, oral phase: Secondary | ICD-10-CM | POA: Diagnosis not present

## 2021-09-12 DIAGNOSIS — R2689 Other abnormalities of gait and mobility: Secondary | ICD-10-CM | POA: Diagnosis not present

## 2021-09-12 DIAGNOSIS — E512 Wernicke's encephalopathy: Secondary | ICD-10-CM | POA: Diagnosis not present

## 2021-09-12 DIAGNOSIS — Z741 Need for assistance with personal care: Secondary | ICD-10-CM | POA: Diagnosis not present

## 2021-09-12 DIAGNOSIS — M6281 Muscle weakness (generalized): Secondary | ICD-10-CM | POA: Diagnosis not present

## 2021-09-12 DIAGNOSIS — M6259 Muscle wasting and atrophy, not elsewhere classified, multiple sites: Secondary | ICD-10-CM | POA: Diagnosis not present

## 2021-09-13 DIAGNOSIS — Z741 Need for assistance with personal care: Secondary | ICD-10-CM | POA: Diagnosis not present

## 2021-09-13 DIAGNOSIS — M6281 Muscle weakness (generalized): Secondary | ICD-10-CM | POA: Diagnosis not present

## 2021-09-13 DIAGNOSIS — R1311 Dysphagia, oral phase: Secondary | ICD-10-CM | POA: Diagnosis not present

## 2021-09-13 DIAGNOSIS — E512 Wernicke's encephalopathy: Secondary | ICD-10-CM | POA: Diagnosis not present

## 2021-09-13 DIAGNOSIS — R2689 Other abnormalities of gait and mobility: Secondary | ICD-10-CM | POA: Diagnosis not present

## 2021-09-13 DIAGNOSIS — M6259 Muscle wasting and atrophy, not elsewhere classified, multiple sites: Secondary | ICD-10-CM | POA: Diagnosis not present

## 2021-09-13 DIAGNOSIS — R2681 Unsteadiness on feet: Secondary | ICD-10-CM | POA: Diagnosis not present

## 2021-09-14 DIAGNOSIS — Z741 Need for assistance with personal care: Secondary | ICD-10-CM | POA: Diagnosis not present

## 2021-09-14 DIAGNOSIS — R2681 Unsteadiness on feet: Secondary | ICD-10-CM | POA: Diagnosis not present

## 2021-09-14 DIAGNOSIS — R1311 Dysphagia, oral phase: Secondary | ICD-10-CM | POA: Diagnosis not present

## 2021-09-14 DIAGNOSIS — E512 Wernicke's encephalopathy: Secondary | ICD-10-CM | POA: Diagnosis not present

## 2021-09-14 DIAGNOSIS — R2689 Other abnormalities of gait and mobility: Secondary | ICD-10-CM | POA: Diagnosis not present

## 2021-09-14 DIAGNOSIS — M6259 Muscle wasting and atrophy, not elsewhere classified, multiple sites: Secondary | ICD-10-CM | POA: Diagnosis not present

## 2021-09-14 DIAGNOSIS — M6281 Muscle weakness (generalized): Secondary | ICD-10-CM | POA: Diagnosis not present

## 2021-09-15 DIAGNOSIS — R2681 Unsteadiness on feet: Secondary | ICD-10-CM | POA: Diagnosis not present

## 2021-09-15 DIAGNOSIS — Z741 Need for assistance with personal care: Secondary | ICD-10-CM | POA: Diagnosis not present

## 2021-09-15 DIAGNOSIS — R1311 Dysphagia, oral phase: Secondary | ICD-10-CM | POA: Diagnosis not present

## 2021-09-15 DIAGNOSIS — R2689 Other abnormalities of gait and mobility: Secondary | ICD-10-CM | POA: Diagnosis not present

## 2021-09-15 DIAGNOSIS — M6281 Muscle weakness (generalized): Secondary | ICD-10-CM | POA: Diagnosis not present

## 2021-09-15 DIAGNOSIS — M6259 Muscle wasting and atrophy, not elsewhere classified, multiple sites: Secondary | ICD-10-CM | POA: Diagnosis not present

## 2021-09-15 DIAGNOSIS — E512 Wernicke's encephalopathy: Secondary | ICD-10-CM | POA: Diagnosis not present

## 2021-09-17 DIAGNOSIS — M6259 Muscle wasting and atrophy, not elsewhere classified, multiple sites: Secondary | ICD-10-CM | POA: Diagnosis not present

## 2021-09-17 DIAGNOSIS — M6281 Muscle weakness (generalized): Secondary | ICD-10-CM | POA: Diagnosis not present

## 2021-09-17 DIAGNOSIS — Z741 Need for assistance with personal care: Secondary | ICD-10-CM | POA: Diagnosis not present

## 2021-09-17 DIAGNOSIS — E512 Wernicke's encephalopathy: Secondary | ICD-10-CM | POA: Diagnosis not present

## 2021-09-17 DIAGNOSIS — R2681 Unsteadiness on feet: Secondary | ICD-10-CM | POA: Diagnosis not present

## 2021-09-17 DIAGNOSIS — R2689 Other abnormalities of gait and mobility: Secondary | ICD-10-CM | POA: Diagnosis not present

## 2021-09-17 DIAGNOSIS — R1311 Dysphagia, oral phase: Secondary | ICD-10-CM | POA: Diagnosis not present

## 2021-09-18 DIAGNOSIS — Z741 Need for assistance with personal care: Secondary | ICD-10-CM | POA: Diagnosis not present

## 2021-09-18 DIAGNOSIS — E512 Wernicke's encephalopathy: Secondary | ICD-10-CM | POA: Diagnosis not present

## 2021-09-18 DIAGNOSIS — M6281 Muscle weakness (generalized): Secondary | ICD-10-CM | POA: Diagnosis not present

## 2021-09-18 DIAGNOSIS — R2689 Other abnormalities of gait and mobility: Secondary | ICD-10-CM | POA: Diagnosis not present

## 2021-09-18 DIAGNOSIS — R1311 Dysphagia, oral phase: Secondary | ICD-10-CM | POA: Diagnosis not present

## 2021-09-18 DIAGNOSIS — M6259 Muscle wasting and atrophy, not elsewhere classified, multiple sites: Secondary | ICD-10-CM | POA: Diagnosis not present

## 2021-09-18 DIAGNOSIS — R2681 Unsteadiness on feet: Secondary | ICD-10-CM | POA: Diagnosis not present

## 2021-09-19 DIAGNOSIS — R1311 Dysphagia, oral phase: Secondary | ICD-10-CM | POA: Diagnosis not present

## 2021-09-19 DIAGNOSIS — R2689 Other abnormalities of gait and mobility: Secondary | ICD-10-CM | POA: Diagnosis not present

## 2021-09-19 DIAGNOSIS — M6259 Muscle wasting and atrophy, not elsewhere classified, multiple sites: Secondary | ICD-10-CM | POA: Diagnosis not present

## 2021-09-19 DIAGNOSIS — Z741 Need for assistance with personal care: Secondary | ICD-10-CM | POA: Diagnosis not present

## 2021-09-19 DIAGNOSIS — R2681 Unsteadiness on feet: Secondary | ICD-10-CM | POA: Diagnosis not present

## 2021-09-19 DIAGNOSIS — E512 Wernicke's encephalopathy: Secondary | ICD-10-CM | POA: Diagnosis not present

## 2021-09-19 DIAGNOSIS — M6281 Muscle weakness (generalized): Secondary | ICD-10-CM | POA: Diagnosis not present

## 2021-09-20 DIAGNOSIS — R2681 Unsteadiness on feet: Secondary | ICD-10-CM | POA: Diagnosis not present

## 2021-09-20 DIAGNOSIS — R1311 Dysphagia, oral phase: Secondary | ICD-10-CM | POA: Diagnosis not present

## 2021-09-20 DIAGNOSIS — M6281 Muscle weakness (generalized): Secondary | ICD-10-CM | POA: Diagnosis not present

## 2021-09-20 DIAGNOSIS — M6259 Muscle wasting and atrophy, not elsewhere classified, multiple sites: Secondary | ICD-10-CM | POA: Diagnosis not present

## 2021-09-20 DIAGNOSIS — R2689 Other abnormalities of gait and mobility: Secondary | ICD-10-CM | POA: Diagnosis not present

## 2021-09-20 DIAGNOSIS — Z741 Need for assistance with personal care: Secondary | ICD-10-CM | POA: Diagnosis not present

## 2021-09-20 DIAGNOSIS — E512 Wernicke's encephalopathy: Secondary | ICD-10-CM | POA: Diagnosis not present

## 2021-09-21 DIAGNOSIS — R2681 Unsteadiness on feet: Secondary | ICD-10-CM | POA: Diagnosis not present

## 2021-09-21 DIAGNOSIS — R1311 Dysphagia, oral phase: Secondary | ICD-10-CM | POA: Diagnosis not present

## 2021-09-21 DIAGNOSIS — M6281 Muscle weakness (generalized): Secondary | ICD-10-CM | POA: Diagnosis not present

## 2021-09-21 DIAGNOSIS — Z741 Need for assistance with personal care: Secondary | ICD-10-CM | POA: Diagnosis not present

## 2021-09-21 DIAGNOSIS — R2689 Other abnormalities of gait and mobility: Secondary | ICD-10-CM | POA: Diagnosis not present

## 2021-09-21 DIAGNOSIS — M6259 Muscle wasting and atrophy, not elsewhere classified, multiple sites: Secondary | ICD-10-CM | POA: Diagnosis not present

## 2021-09-21 DIAGNOSIS — E512 Wernicke's encephalopathy: Secondary | ICD-10-CM | POA: Diagnosis not present

## 2021-09-22 DIAGNOSIS — R2689 Other abnormalities of gait and mobility: Secondary | ICD-10-CM | POA: Diagnosis not present

## 2021-09-22 DIAGNOSIS — R1311 Dysphagia, oral phase: Secondary | ICD-10-CM | POA: Diagnosis not present

## 2021-09-22 DIAGNOSIS — Z741 Need for assistance with personal care: Secondary | ICD-10-CM | POA: Diagnosis not present

## 2021-09-22 DIAGNOSIS — R2681 Unsteadiness on feet: Secondary | ICD-10-CM | POA: Diagnosis not present

## 2021-09-22 DIAGNOSIS — E512 Wernicke's encephalopathy: Secondary | ICD-10-CM | POA: Diagnosis not present

## 2021-09-22 DIAGNOSIS — M6281 Muscle weakness (generalized): Secondary | ICD-10-CM | POA: Diagnosis not present

## 2021-09-22 DIAGNOSIS — M6259 Muscle wasting and atrophy, not elsewhere classified, multiple sites: Secondary | ICD-10-CM | POA: Diagnosis not present

## 2021-09-23 DIAGNOSIS — M6281 Muscle weakness (generalized): Secondary | ICD-10-CM | POA: Diagnosis not present

## 2021-09-23 DIAGNOSIS — R2689 Other abnormalities of gait and mobility: Secondary | ICD-10-CM | POA: Diagnosis not present

## 2021-09-23 DIAGNOSIS — R1311 Dysphagia, oral phase: Secondary | ICD-10-CM | POA: Diagnosis not present

## 2021-09-23 DIAGNOSIS — E512 Wernicke's encephalopathy: Secondary | ICD-10-CM | POA: Diagnosis not present

## 2021-09-23 DIAGNOSIS — Z741 Need for assistance with personal care: Secondary | ICD-10-CM | POA: Diagnosis not present

## 2021-09-23 DIAGNOSIS — R2681 Unsteadiness on feet: Secondary | ICD-10-CM | POA: Diagnosis not present

## 2021-09-23 DIAGNOSIS — M6259 Muscle wasting and atrophy, not elsewhere classified, multiple sites: Secondary | ICD-10-CM | POA: Diagnosis not present

## 2021-09-25 ENCOUNTER — Non-Acute Institutional Stay (SKILLED_NURSING_FACILITY): Payer: Medicare Other | Admitting: Adult Health

## 2021-09-25 ENCOUNTER — Encounter: Payer: Self-pay | Admitting: Adult Health

## 2021-09-25 DIAGNOSIS — R1311 Dysphagia, oral phase: Secondary | ICD-10-CM | POA: Diagnosis not present

## 2021-09-25 DIAGNOSIS — R000 Tachycardia, unspecified: Secondary | ICD-10-CM | POA: Diagnosis not present

## 2021-09-25 DIAGNOSIS — M6259 Muscle wasting and atrophy, not elsewhere classified, multiple sites: Secondary | ICD-10-CM | POA: Diagnosis not present

## 2021-09-25 DIAGNOSIS — J432 Centrilobular emphysema: Secondary | ICD-10-CM | POA: Diagnosis not present

## 2021-09-25 DIAGNOSIS — E512 Wernicke's encephalopathy: Secondary | ICD-10-CM

## 2021-09-25 DIAGNOSIS — Z741 Need for assistance with personal care: Secondary | ICD-10-CM | POA: Diagnosis not present

## 2021-09-25 DIAGNOSIS — F419 Anxiety disorder, unspecified: Secondary | ICD-10-CM | POA: Diagnosis not present

## 2021-09-25 DIAGNOSIS — M6281 Muscle weakness (generalized): Secondary | ICD-10-CM | POA: Diagnosis not present

## 2021-09-25 DIAGNOSIS — R2681 Unsteadiness on feet: Secondary | ICD-10-CM | POA: Diagnosis not present

## 2021-09-25 DIAGNOSIS — R2689 Other abnormalities of gait and mobility: Secondary | ICD-10-CM | POA: Diagnosis not present

## 2021-09-25 NOTE — Progress Notes (Signed)
? ?Location:  [facility] ?Nursing Home Room Number: 751-W ?Place of Service:  SNF (31) ?Provider:  Durenda Age, DNP, FNP-BC ? ?Patient Care Team: ?Billie Ruddy, MD as PCP - General (Family Medicine) ? ?Extended Emergency Contact Information ?Primary Emergency Contact: MASSY,CHRIS ? [place] ?Mobile Phone: [phone] ?Relation: Son ?Secondary Emergency Contact: Parchment,Tamieka ?Address: [address] ?         [place], [place] [address] [place] ?Mobile Phone: [phone] ?Relation: Niece ? ?Code Status:  DNR ? ?Goals of care: Advanced Directive information ? ?  09/25/2021  ?  9:08 AM  ?Advanced Directives  ?Does Patient Have a Medical Advance Directive? Yes  ?Type of Advance Directive Out of facility DNR (pink MOST or yellow form)  ?Does patient want to make changes to medical advance directive? No - Patient declined  ? ? ? ?Chief Complaint  ?Patient presents with  ? Routine  ? ? ?HPI:  ?Pt is a 71 y.o. female seen today for medical management of chronic diseases. She is a long-term care resident of [facility]. ? ?Sinus tachycardia  -  HRs <100, takes metoprolol tartrate 25 mg 1 tab twice a day ? ?Centrilobular emphysema (HCC) -   no wheezing/SOB, takes albuterol HFA 90 mcg inhaler 2 inhalations every 6 hours PRN ? ?Anxiety  -  denies being anxious, takes Ativan 0.5 mg 1 tab twice a day ? ?Wernicke encephalopathy syndrome  -  BIMS score 5/15, takes vitamin B1 676 mg daily, folic acid 1 mg daily and multivitamins with minerals 1 tab daily ? ? ? ?Past Medical History:  ?Diagnosis Date  ? Hypertension   ? Polycythemia vera(238.4)   ? ?Past Surgical History:  ?Procedure Laterality Date  ? BRONCHIAL WASHINGS  07/16/2019  ? Procedure: BRONCHIAL WASHINGS;  Surgeon: Rigoberto Noel, MD;  Location: [facility];  Service: Cardiopulmonary;;  ? VIDEO BRONCHOSCOPY N/A 07/16/2019  ? Procedure: VIDEO BRONCHOSCOPY WITHOUT FLUORO;  Surgeon: Rigoberto Noel, MD;   Location: [facility];  Service: Cardiopulmonary;  Laterality: N/A;  ? ? ?No Known Allergies ? ?Outpatient Encounter Medications as of 09/25/2021  ?Medication Sig  ? acetaminophen (TYLENOL) 325 MG tablet Take 650 mg by mouth as needed (For pain.).  ? Albuterol Sulfate (PROAIR RESPICLICK) 195 (90 Base) MCG/ACT AEPB Inhale 2 puffs into the lungs every 6 (six) hours as needed (For Wheezing.).  ? bisacodyl (DULCOLAX) 10 MG suppository If not relieved by MOM, give 10 mg Bisacodyl suppositiory rectally X 1 dose in 24 hours as needed  ? feeding supplement (ENSURE ENLIVE / ENSURE PLUS) LIQD Take 237 mLs by mouth 2 (two) times daily between meals.  ? folic acid (FOLVITE) 1 MG tablet Take 1 tablet (1 mg total) by mouth daily.  ? Ipratropium-Albuterol (COMBIVENT) 20-100 MCG/ACT AERS respimat Inhale 1 puff into the lungs in the morning and at bedtime.  ? LORazepam (ATIVAN) 0.5 MG tablet Take 1 tablet (0.5 mg total) by mouth 2 (two) times daily.  ? Magnesium Hydroxide (MILK OF MAGNESIA PO) If no BM in 3 days, give 30 cc Milk of Magnesium p.o. x 1 dose in 24 hours as needed (Do not use standing constipation orders for residents with renal failure CFR less than 30. Contact MD for orders)  ? melatonin 5 MG TABS Take 5 mg by mouth at bedtime.  ? metoprolol tartrate (LOPRESSOR) 25 MG tablet Take 25 mg by mouth 2 (two) times daily.  ? Multiple Vitamins-Minerals (CENTRUM SILVER 50+WOMEN) TABS  Take 1 tablet by mouth daily.  ? NON FORMULARY Diet:Mech soft/thin consistency  ? Nutritional Supplements (NUTRITIONAL SUPPLEMENT PO) Take 240 mLs by mouth in the morning and at bedtime. BOOST  ? Nutritional Supplements (NUTRITIONAL SUPPLEMENT PO) Take by mouth. MAGIC CUP TWICE DAILY DIETARY SUPPLEMEMT  ? omeprazole (PRILOSEC) 20 MG capsule Take 1 capsule (20 mg total) by mouth daily.  ? OXYGEN Inhale into the lungs.  ? Sodium Phosphates (RA SALINE ENEMA RE) If not relieved by Biscodyl suppository, give disposable Saline Enema rectally X 1 dose/24  hrs as needed  ? thiamine 100 MG tablet Take 2 tablets (200 mg total) by mouth daily.  ? Blood Pressure Monitoring (BLOOD PRESSURE CUFF) MISC Use daily as directed.  ? SYRINGE-NEEDLE, DISP, 3 ML (BD ECLIPSE SYRINGE) 25G X 5/8" 3 ML MISC Use as directed  ? ?No facility-administered encounter medications on file as of 09/25/2021.  ? ? ?Review of Systems  ?Constitutional:  Negative for appetite change, chills, fatigue and fever.  ?HENT:  Negative for congestion, hearing loss, rhinorrhea and sore throat.   ?Eyes: Negative.   ?Respiratory:  Negative for cough, shortness of breath and wheezing.   ?Cardiovascular:  Negative for chest pain, palpitations and leg swelling.  ?Gastrointestinal:  Negative for abdominal pain, constipation, diarrhea, nausea and vomiting.  ?Genitourinary:  Negative for dysuria.  ?Musculoskeletal:  Negative for arthralgias, back pain and myalgias.  ?Skin:  Negative for color change, rash and wound.  ?Neurological:  Negative for dizziness, weakness and headaches.  ?Psychiatric/Behavioral:  Negative for behavioral problems. The patient is not nervous/anxious.    ? ? ? ?Immunization History  ?Administered Date(s) Administered  ? DTaP 02/02/2014  ? Fluad Quad(high Dose 65+) 02/02/2021  ? Unspecified SARS-COV-2 Vaccination 08/03/2019, 09/03/2019  ? Zoster Recombinat (Shingrix) 03/04/2016  ? ?Pertinent  Health Maintenance Due  ?Topic Date Due  ? COLONOSCOPY (Pts 45-58yr Insurance coverage will need to be confirmed)  Never done  ? MAMMOGRAM  Never done  ? DEXA SCAN  Never done  ? INFLUENZA VACCINE  01/02/2022  ? ? ?  07/08/2021  ?  8:05 AM 07/08/2021  ?  9:41 PM 07/09/2021  ?  7:52 AM 07/09/2021  ? 10:00 PM 07/10/2021  ? 12:00 PM  ?Fall Risk  ?Patient Fall Risk Level High fall risk High fall risk High fall risk High fall risk High fall risk  ? ? ? ?Vitals:  ? 09/25/21 0905  ?BP: (!) 142/91  ?Pulse: 92  ?Resp: 18  ?Temp: (!) 97.3 ?F (36.3 ?C)  ?SpO2: 95%  ?Weight: 115 lb (52.2 kg)  ?Height: '5\' 2"'$  (1.575 m)  ? ?Body  mass index is 21.03 kg/m?. ? ?Physical Exam ?Constitutional:   ?   General: She is not in acute distress. ?   Appearance: Normal appearance.  ?HENT:  ?   Head: Normocephalic and atraumatic.  ?   Nose: Nose normal.  ?   Mouth/Throat:  ?   Mouth: Mucous membranes are moist.  ?Eyes:  ?   Conjunctiva/sclera: Conjunctivae normal.  ?Cardiovascular:  ?   Rate and Rhythm: Normal rate and regular rhythm.  ?Pulmonary:  ?   Effort: Pulmonary effort is normal.  ?   Breath sounds: Normal breath sounds.  ?Abdominal:  ?   General: Bowel sounds are normal.  ?   Palpations: Abdomen is soft.  ?Musculoskeletal:     ?   General: Normal range of motion.  ?   Cervical back: Normal range of motion.  ?Skin: ?  General: Skin is warm and dry.  ?Neurological:  ?   Mental Status: She is alert. Mental status is at baseline. She is disoriented.  ?Psychiatric:     ?   Mood and Affect: Mood normal.     ?   Behavior: Behavior normal.  ?  ? ? ? ?Labs reviewed: ?Recent Labs  ?  07/03/21 ?0500 07/04/21 ?1528 07/05/21 ?2423 07/08/21 ?2237 07/12/21 ?0000 08/01/21 ?0000  ?NA 135 137 134* 134* 138 144  ?K 3.1* 4.6 4.2 3.5 4.3 3.7  ?CL 101 107 110 105 104 106  ?CO2 26 25 20* 25 27* 26*  ?GLUCOSE 94 78 73 113*  --   --   ?BUN 5* 6* 5* '8 8 9  '$ ?CREATININE 0.54 0.59 0.51 0.44 0.6 0.6  ?CALCIUM 8.0* 8.3* 7.7* 8.1* 8.4* 9.3  ?MG 1.5*  --   --  1.8  --   --   ? ?Recent Labs  ?  07/04/21 ?1528 07/05/21 ?5361 07/08/21 ?2237 07/12/21 ?0000 08/01/21 ?0000  ?AST 67* 50* 45* 47* 39*  ?ALT '24 20 20 18 19  '$ ?ALKPHOS 119 90 105 113 105  ?BILITOT 1.6* 1.1 0.7  --   --   ?PROT 6.7 5.3* 5.6*  --   --   ?ALBUMIN 2.3* 1.8* 1.8* 2.4* 2.7*  ? ?Recent Labs  ?  06/15/21 ?1409 06/17/21 ?2111 07/02/21 ?1943 07/02/21 ?2019 07/04/21 ?1528 07/05/21 ?4431 07/07/21 ?0724  ?WBC 6.7   < > 8.4  --  9.3 8.4 7.7  ?NEUTROABS 4.1  --  4.1  --   --   --   --   ?HGB 13.5   < > 13.9   < > 14.7 12.1 12.9  ?HCT 41.2   < > 40.7   < > 44.1 35.2* 37.1  ?MCV 101.1*   < > 96.4  --  101.6* 98.9 96.9  ?PLT  178.0   < > 251  --  236 215 210  ? < > = values in this interval not displayed.  ? ?Lab Results  ?Component Value Date  ? TSH 1.52 06/15/2021  ? ?Lab Results  ?Component Value Date  ? HGBA1C 4.7 02/

## 2021-09-26 DIAGNOSIS — M6281 Muscle weakness (generalized): Secondary | ICD-10-CM | POA: Diagnosis not present

## 2021-09-26 DIAGNOSIS — R2689 Other abnormalities of gait and mobility: Secondary | ICD-10-CM | POA: Diagnosis not present

## 2021-09-26 DIAGNOSIS — R2681 Unsteadiness on feet: Secondary | ICD-10-CM | POA: Diagnosis not present

## 2021-09-26 DIAGNOSIS — Z741 Need for assistance with personal care: Secondary | ICD-10-CM | POA: Diagnosis not present

## 2021-09-26 DIAGNOSIS — R1311 Dysphagia, oral phase: Secondary | ICD-10-CM | POA: Diagnosis not present

## 2021-09-26 DIAGNOSIS — M6259 Muscle wasting and atrophy, not elsewhere classified, multiple sites: Secondary | ICD-10-CM | POA: Diagnosis not present

## 2021-09-26 DIAGNOSIS — E512 Wernicke's encephalopathy: Secondary | ICD-10-CM | POA: Diagnosis not present

## 2021-09-27 DIAGNOSIS — R2681 Unsteadiness on feet: Secondary | ICD-10-CM | POA: Diagnosis not present

## 2021-09-27 DIAGNOSIS — M6281 Muscle weakness (generalized): Secondary | ICD-10-CM | POA: Diagnosis not present

## 2021-09-27 DIAGNOSIS — R1311 Dysphagia, oral phase: Secondary | ICD-10-CM | POA: Diagnosis not present

## 2021-09-27 DIAGNOSIS — Z741 Need for assistance with personal care: Secondary | ICD-10-CM | POA: Diagnosis not present

## 2021-09-27 DIAGNOSIS — E512 Wernicke's encephalopathy: Secondary | ICD-10-CM | POA: Diagnosis not present

## 2021-09-27 DIAGNOSIS — R2689 Other abnormalities of gait and mobility: Secondary | ICD-10-CM | POA: Diagnosis not present

## 2021-09-27 DIAGNOSIS — M6259 Muscle wasting and atrophy, not elsewhere classified, multiple sites: Secondary | ICD-10-CM | POA: Diagnosis not present

## 2021-09-28 DIAGNOSIS — R2689 Other abnormalities of gait and mobility: Secondary | ICD-10-CM | POA: Diagnosis not present

## 2021-09-28 DIAGNOSIS — E512 Wernicke's encephalopathy: Secondary | ICD-10-CM | POA: Diagnosis not present

## 2021-09-28 DIAGNOSIS — R1311 Dysphagia, oral phase: Secondary | ICD-10-CM | POA: Diagnosis not present

## 2021-09-28 DIAGNOSIS — R2681 Unsteadiness on feet: Secondary | ICD-10-CM | POA: Diagnosis not present

## 2021-09-28 DIAGNOSIS — M6281 Muscle weakness (generalized): Secondary | ICD-10-CM | POA: Diagnosis not present

## 2021-09-28 DIAGNOSIS — Z741 Need for assistance with personal care: Secondary | ICD-10-CM | POA: Diagnosis not present

## 2021-09-28 DIAGNOSIS — M6259 Muscle wasting and atrophy, not elsewhere classified, multiple sites: Secondary | ICD-10-CM | POA: Diagnosis not present

## 2021-09-29 DIAGNOSIS — R2681 Unsteadiness on feet: Secondary | ICD-10-CM | POA: Diagnosis not present

## 2021-09-29 DIAGNOSIS — R1311 Dysphagia, oral phase: Secondary | ICD-10-CM | POA: Diagnosis not present

## 2021-09-29 DIAGNOSIS — R2689 Other abnormalities of gait and mobility: Secondary | ICD-10-CM | POA: Diagnosis not present

## 2021-09-29 DIAGNOSIS — Z741 Need for assistance with personal care: Secondary | ICD-10-CM | POA: Diagnosis not present

## 2021-09-29 DIAGNOSIS — M6281 Muscle weakness (generalized): Secondary | ICD-10-CM | POA: Diagnosis not present

## 2021-09-29 DIAGNOSIS — E512 Wernicke's encephalopathy: Secondary | ICD-10-CM | POA: Diagnosis not present

## 2021-09-29 DIAGNOSIS — M6259 Muscle wasting and atrophy, not elsewhere classified, multiple sites: Secondary | ICD-10-CM | POA: Diagnosis not present

## 2021-10-01 DIAGNOSIS — E512 Wernicke's encephalopathy: Secondary | ICD-10-CM | POA: Diagnosis not present

## 2021-10-01 DIAGNOSIS — R2689 Other abnormalities of gait and mobility: Secondary | ICD-10-CM | POA: Diagnosis not present

## 2021-10-01 DIAGNOSIS — M6259 Muscle wasting and atrophy, not elsewhere classified, multiple sites: Secondary | ICD-10-CM | POA: Diagnosis not present

## 2021-10-01 DIAGNOSIS — M6281 Muscle weakness (generalized): Secondary | ICD-10-CM | POA: Diagnosis not present

## 2021-10-01 DIAGNOSIS — R1311 Dysphagia, oral phase: Secondary | ICD-10-CM | POA: Diagnosis not present

## 2021-10-01 DIAGNOSIS — Z741 Need for assistance with personal care: Secondary | ICD-10-CM | POA: Diagnosis not present

## 2021-10-01 DIAGNOSIS — R2681 Unsteadiness on feet: Secondary | ICD-10-CM | POA: Diagnosis not present

## 2021-10-02 DIAGNOSIS — E512 Wernicke's encephalopathy: Secondary | ICD-10-CM | POA: Diagnosis not present

## 2021-10-02 DIAGNOSIS — R2689 Other abnormalities of gait and mobility: Secondary | ICD-10-CM | POA: Diagnosis not present

## 2021-10-02 DIAGNOSIS — M6281 Muscle weakness (generalized): Secondary | ICD-10-CM | POA: Diagnosis not present

## 2021-10-02 DIAGNOSIS — M6259 Muscle wasting and atrophy, not elsewhere classified, multiple sites: Secondary | ICD-10-CM | POA: Diagnosis not present

## 2021-10-02 DIAGNOSIS — Z741 Need for assistance with personal care: Secondary | ICD-10-CM | POA: Diagnosis not present

## 2021-10-02 DIAGNOSIS — R2681 Unsteadiness on feet: Secondary | ICD-10-CM | POA: Diagnosis not present

## 2021-10-03 DIAGNOSIS — M6259 Muscle wasting and atrophy, not elsewhere classified, multiple sites: Secondary | ICD-10-CM | POA: Diagnosis not present

## 2021-10-03 DIAGNOSIS — R2681 Unsteadiness on feet: Secondary | ICD-10-CM | POA: Diagnosis not present

## 2021-10-03 DIAGNOSIS — Z741 Need for assistance with personal care: Secondary | ICD-10-CM | POA: Diagnosis not present

## 2021-10-03 DIAGNOSIS — M6281 Muscle weakness (generalized): Secondary | ICD-10-CM | POA: Diagnosis not present

## 2021-10-03 DIAGNOSIS — E512 Wernicke's encephalopathy: Secondary | ICD-10-CM | POA: Diagnosis not present

## 2021-10-03 DIAGNOSIS — R2689 Other abnormalities of gait and mobility: Secondary | ICD-10-CM | POA: Diagnosis not present

## 2021-10-04 DIAGNOSIS — M6281 Muscle weakness (generalized): Secondary | ICD-10-CM | POA: Diagnosis not present

## 2021-10-04 DIAGNOSIS — Z741 Need for assistance with personal care: Secondary | ICD-10-CM | POA: Diagnosis not present

## 2021-10-04 DIAGNOSIS — M6259 Muscle wasting and atrophy, not elsewhere classified, multiple sites: Secondary | ICD-10-CM | POA: Diagnosis not present

## 2021-10-04 DIAGNOSIS — R2681 Unsteadiness on feet: Secondary | ICD-10-CM | POA: Diagnosis not present

## 2021-10-04 DIAGNOSIS — R2689 Other abnormalities of gait and mobility: Secondary | ICD-10-CM | POA: Diagnosis not present

## 2021-10-04 DIAGNOSIS — E512 Wernicke's encephalopathy: Secondary | ICD-10-CM | POA: Diagnosis not present

## 2021-10-06 DIAGNOSIS — M6281 Muscle weakness (generalized): Secondary | ICD-10-CM | POA: Diagnosis not present

## 2021-10-06 DIAGNOSIS — R2689 Other abnormalities of gait and mobility: Secondary | ICD-10-CM | POA: Diagnosis not present

## 2021-10-06 DIAGNOSIS — E512 Wernicke's encephalopathy: Secondary | ICD-10-CM | POA: Diagnosis not present

## 2021-10-06 DIAGNOSIS — Z741 Need for assistance with personal care: Secondary | ICD-10-CM | POA: Diagnosis not present

## 2021-10-06 DIAGNOSIS — D696 Thrombocytopenia, unspecified: Secondary | ICD-10-CM | POA: Diagnosis not present

## 2021-10-06 DIAGNOSIS — E440 Moderate protein-calorie malnutrition: Secondary | ICD-10-CM | POA: Diagnosis not present

## 2021-10-06 DIAGNOSIS — R2681 Unsteadiness on feet: Secondary | ICD-10-CM | POA: Diagnosis not present

## 2021-10-06 DIAGNOSIS — M6259 Muscle wasting and atrophy, not elsewhere classified, multiple sites: Secondary | ICD-10-CM | POA: Diagnosis not present

## 2021-10-07 DIAGNOSIS — M6259 Muscle wasting and atrophy, not elsewhere classified, multiple sites: Secondary | ICD-10-CM | POA: Diagnosis not present

## 2021-10-07 DIAGNOSIS — Z741 Need for assistance with personal care: Secondary | ICD-10-CM | POA: Diagnosis not present

## 2021-10-07 DIAGNOSIS — E512 Wernicke's encephalopathy: Secondary | ICD-10-CM | POA: Diagnosis not present

## 2021-10-07 DIAGNOSIS — R2681 Unsteadiness on feet: Secondary | ICD-10-CM | POA: Diagnosis not present

## 2021-10-07 DIAGNOSIS — R2689 Other abnormalities of gait and mobility: Secondary | ICD-10-CM | POA: Diagnosis not present

## 2021-10-07 DIAGNOSIS — M6281 Muscle weakness (generalized): Secondary | ICD-10-CM | POA: Diagnosis not present

## 2021-10-09 DIAGNOSIS — Z741 Need for assistance with personal care: Secondary | ICD-10-CM | POA: Diagnosis not present

## 2021-10-09 DIAGNOSIS — M6259 Muscle wasting and atrophy, not elsewhere classified, multiple sites: Secondary | ICD-10-CM | POA: Diagnosis not present

## 2021-10-09 DIAGNOSIS — M6281 Muscle weakness (generalized): Secondary | ICD-10-CM | POA: Diagnosis not present

## 2021-10-09 DIAGNOSIS — E512 Wernicke's encephalopathy: Secondary | ICD-10-CM | POA: Diagnosis not present

## 2021-10-09 DIAGNOSIS — R2681 Unsteadiness on feet: Secondary | ICD-10-CM | POA: Diagnosis not present

## 2021-10-09 DIAGNOSIS — R2689 Other abnormalities of gait and mobility: Secondary | ICD-10-CM | POA: Diagnosis not present

## 2021-10-10 DIAGNOSIS — R2689 Other abnormalities of gait and mobility: Secondary | ICD-10-CM | POA: Diagnosis not present

## 2021-10-10 DIAGNOSIS — M6281 Muscle weakness (generalized): Secondary | ICD-10-CM | POA: Diagnosis not present

## 2021-10-10 DIAGNOSIS — R2681 Unsteadiness on feet: Secondary | ICD-10-CM | POA: Diagnosis not present

## 2021-10-10 DIAGNOSIS — E512 Wernicke's encephalopathy: Secondary | ICD-10-CM | POA: Diagnosis not present

## 2021-10-10 DIAGNOSIS — M6259 Muscle wasting and atrophy, not elsewhere classified, multiple sites: Secondary | ICD-10-CM | POA: Diagnosis not present

## 2021-10-10 DIAGNOSIS — Z741 Need for assistance with personal care: Secondary | ICD-10-CM | POA: Diagnosis not present

## 2021-10-11 DIAGNOSIS — R2689 Other abnormalities of gait and mobility: Secondary | ICD-10-CM | POA: Diagnosis not present

## 2021-10-11 DIAGNOSIS — E512 Wernicke's encephalopathy: Secondary | ICD-10-CM | POA: Diagnosis not present

## 2021-10-11 DIAGNOSIS — M6281 Muscle weakness (generalized): Secondary | ICD-10-CM | POA: Diagnosis not present

## 2021-10-11 DIAGNOSIS — M6259 Muscle wasting and atrophy, not elsewhere classified, multiple sites: Secondary | ICD-10-CM | POA: Diagnosis not present

## 2021-10-11 DIAGNOSIS — Z741 Need for assistance with personal care: Secondary | ICD-10-CM | POA: Diagnosis not present

## 2021-10-11 DIAGNOSIS — R2681 Unsteadiness on feet: Secondary | ICD-10-CM | POA: Diagnosis not present

## 2021-10-16 DIAGNOSIS — M6281 Muscle weakness (generalized): Secondary | ICD-10-CM | POA: Diagnosis not present

## 2021-10-16 DIAGNOSIS — Z741 Need for assistance with personal care: Secondary | ICD-10-CM | POA: Diagnosis not present

## 2021-10-16 DIAGNOSIS — E512 Wernicke's encephalopathy: Secondary | ICD-10-CM | POA: Diagnosis not present

## 2021-10-16 DIAGNOSIS — R2681 Unsteadiness on feet: Secondary | ICD-10-CM | POA: Diagnosis not present

## 2021-10-16 DIAGNOSIS — R2689 Other abnormalities of gait and mobility: Secondary | ICD-10-CM | POA: Diagnosis not present

## 2021-10-16 DIAGNOSIS — M6259 Muscle wasting and atrophy, not elsewhere classified, multiple sites: Secondary | ICD-10-CM | POA: Diagnosis not present

## 2021-10-17 ENCOUNTER — Other Ambulatory Visit: Payer: Self-pay | Admitting: Adult Health

## 2021-10-17 DIAGNOSIS — E512 Wernicke's encephalopathy: Secondary | ICD-10-CM | POA: Diagnosis not present

## 2021-10-17 DIAGNOSIS — R2681 Unsteadiness on feet: Secondary | ICD-10-CM | POA: Diagnosis not present

## 2021-10-17 DIAGNOSIS — R2689 Other abnormalities of gait and mobility: Secondary | ICD-10-CM | POA: Diagnosis not present

## 2021-10-17 DIAGNOSIS — M6281 Muscle weakness (generalized): Secondary | ICD-10-CM | POA: Diagnosis not present

## 2021-10-17 DIAGNOSIS — M6259 Muscle wasting and atrophy, not elsewhere classified, multiple sites: Secondary | ICD-10-CM | POA: Diagnosis not present

## 2021-10-17 DIAGNOSIS — F419 Anxiety disorder, unspecified: Secondary | ICD-10-CM

## 2021-10-17 DIAGNOSIS — Z741 Need for assistance with personal care: Secondary | ICD-10-CM | POA: Diagnosis not present

## 2021-10-17 MED ORDER — LORAZEPAM 0.5 MG PO TABS: 0.5000 mg | ORAL_TABLET | Freq: Two times a day (BID) | ORAL | 0 refills | Status: AC

## 2021-10-18 DIAGNOSIS — M6281 Muscle weakness (generalized): Secondary | ICD-10-CM | POA: Diagnosis not present

## 2021-10-18 DIAGNOSIS — M6259 Muscle wasting and atrophy, not elsewhere classified, multiple sites: Secondary | ICD-10-CM | POA: Diagnosis not present

## 2021-10-18 DIAGNOSIS — E512 Wernicke's encephalopathy: Secondary | ICD-10-CM | POA: Diagnosis not present

## 2021-10-18 DIAGNOSIS — R2681 Unsteadiness on feet: Secondary | ICD-10-CM | POA: Diagnosis not present

## 2021-10-18 DIAGNOSIS — Z741 Need for assistance with personal care: Secondary | ICD-10-CM | POA: Diagnosis not present

## 2021-10-18 DIAGNOSIS — R2689 Other abnormalities of gait and mobility: Secondary | ICD-10-CM | POA: Diagnosis not present

## 2021-10-19 DIAGNOSIS — R2689 Other abnormalities of gait and mobility: Secondary | ICD-10-CM | POA: Diagnosis not present

## 2021-10-19 DIAGNOSIS — M6281 Muscle weakness (generalized): Secondary | ICD-10-CM | POA: Diagnosis not present

## 2021-10-19 DIAGNOSIS — Z741 Need for assistance with personal care: Secondary | ICD-10-CM | POA: Diagnosis not present

## 2021-10-19 DIAGNOSIS — M6259 Muscle wasting and atrophy, not elsewhere classified, multiple sites: Secondary | ICD-10-CM | POA: Diagnosis not present

## 2021-10-19 DIAGNOSIS — R2681 Unsteadiness on feet: Secondary | ICD-10-CM | POA: Diagnosis not present

## 2021-10-19 DIAGNOSIS — E512 Wernicke's encephalopathy: Secondary | ICD-10-CM | POA: Diagnosis not present

## 2021-10-20 DIAGNOSIS — E512 Wernicke's encephalopathy: Secondary | ICD-10-CM | POA: Diagnosis not present

## 2021-10-20 DIAGNOSIS — M6259 Muscle wasting and atrophy, not elsewhere classified, multiple sites: Secondary | ICD-10-CM | POA: Diagnosis not present

## 2021-10-20 DIAGNOSIS — R2689 Other abnormalities of gait and mobility: Secondary | ICD-10-CM | POA: Diagnosis not present

## 2021-10-20 DIAGNOSIS — Z741 Need for assistance with personal care: Secondary | ICD-10-CM | POA: Diagnosis not present

## 2021-10-20 DIAGNOSIS — R2681 Unsteadiness on feet: Secondary | ICD-10-CM | POA: Diagnosis not present

## 2021-10-20 DIAGNOSIS — M6281 Muscle weakness (generalized): Secondary | ICD-10-CM | POA: Diagnosis not present

## 2021-10-23 DIAGNOSIS — Z741 Need for assistance with personal care: Secondary | ICD-10-CM | POA: Diagnosis not present

## 2021-10-23 DIAGNOSIS — R2681 Unsteadiness on feet: Secondary | ICD-10-CM | POA: Diagnosis not present

## 2021-10-23 DIAGNOSIS — M6259 Muscle wasting and atrophy, not elsewhere classified, multiple sites: Secondary | ICD-10-CM | POA: Diagnosis not present

## 2021-10-23 DIAGNOSIS — E512 Wernicke's encephalopathy: Secondary | ICD-10-CM | POA: Diagnosis not present

## 2021-10-23 DIAGNOSIS — M6281 Muscle weakness (generalized): Secondary | ICD-10-CM | POA: Diagnosis not present

## 2021-10-23 DIAGNOSIS — R2689 Other abnormalities of gait and mobility: Secondary | ICD-10-CM | POA: Diagnosis not present

## 2021-10-24 DIAGNOSIS — R2689 Other abnormalities of gait and mobility: Secondary | ICD-10-CM | POA: Diagnosis not present

## 2021-10-24 DIAGNOSIS — E512 Wernicke's encephalopathy: Secondary | ICD-10-CM | POA: Diagnosis not present

## 2021-10-24 DIAGNOSIS — M6259 Muscle wasting and atrophy, not elsewhere classified, multiple sites: Secondary | ICD-10-CM | POA: Diagnosis not present

## 2021-10-24 DIAGNOSIS — R2681 Unsteadiness on feet: Secondary | ICD-10-CM | POA: Diagnosis not present

## 2021-10-24 DIAGNOSIS — M6281 Muscle weakness (generalized): Secondary | ICD-10-CM | POA: Diagnosis not present

## 2021-10-24 DIAGNOSIS — Z741 Need for assistance with personal care: Secondary | ICD-10-CM | POA: Diagnosis not present

## 2021-10-25 DIAGNOSIS — Z741 Need for assistance with personal care: Secondary | ICD-10-CM | POA: Diagnosis not present

## 2021-10-25 DIAGNOSIS — R2681 Unsteadiness on feet: Secondary | ICD-10-CM | POA: Diagnosis not present

## 2021-10-25 DIAGNOSIS — M6259 Muscle wasting and atrophy, not elsewhere classified, multiple sites: Secondary | ICD-10-CM | POA: Diagnosis not present

## 2021-10-25 DIAGNOSIS — E512 Wernicke's encephalopathy: Secondary | ICD-10-CM | POA: Diagnosis not present

## 2021-10-25 DIAGNOSIS — M6281 Muscle weakness (generalized): Secondary | ICD-10-CM | POA: Diagnosis not present

## 2021-10-25 DIAGNOSIS — R2689 Other abnormalities of gait and mobility: Secondary | ICD-10-CM | POA: Diagnosis not present

## 2021-10-26 DIAGNOSIS — E512 Wernicke's encephalopathy: Secondary | ICD-10-CM | POA: Diagnosis not present

## 2021-10-26 DIAGNOSIS — R2689 Other abnormalities of gait and mobility: Secondary | ICD-10-CM | POA: Diagnosis not present

## 2021-10-26 DIAGNOSIS — M6281 Muscle weakness (generalized): Secondary | ICD-10-CM | POA: Diagnosis not present

## 2021-10-26 DIAGNOSIS — R2681 Unsteadiness on feet: Secondary | ICD-10-CM | POA: Diagnosis not present

## 2021-10-26 DIAGNOSIS — M6259 Muscle wasting and atrophy, not elsewhere classified, multiple sites: Secondary | ICD-10-CM | POA: Diagnosis not present

## 2021-10-26 DIAGNOSIS — Z741 Need for assistance with personal care: Secondary | ICD-10-CM | POA: Diagnosis not present

## 2021-10-27 ENCOUNTER — Non-Acute Institutional Stay (SKILLED_NURSING_FACILITY): Payer: Medicare Other | Admitting: Adult Health

## 2021-10-27 ENCOUNTER — Encounter: Payer: Self-pay | Admitting: Adult Health

## 2021-10-27 DIAGNOSIS — F419 Anxiety disorder, unspecified: Secondary | ICD-10-CM

## 2021-10-27 DIAGNOSIS — I10 Essential (primary) hypertension: Secondary | ICD-10-CM | POA: Diagnosis not present

## 2021-10-27 DIAGNOSIS — Z741 Need for assistance with personal care: Secondary | ICD-10-CM | POA: Diagnosis not present

## 2021-10-27 DIAGNOSIS — R000 Tachycardia, unspecified: Secondary | ICD-10-CM

## 2021-10-27 DIAGNOSIS — R2689 Other abnormalities of gait and mobility: Secondary | ICD-10-CM | POA: Diagnosis not present

## 2021-10-27 DIAGNOSIS — J432 Centrilobular emphysema: Secondary | ICD-10-CM | POA: Diagnosis not present

## 2021-10-27 DIAGNOSIS — R2681 Unsteadiness on feet: Secondary | ICD-10-CM | POA: Diagnosis not present

## 2021-10-27 DIAGNOSIS — M6281 Muscle weakness (generalized): Secondary | ICD-10-CM | POA: Diagnosis not present

## 2021-10-27 DIAGNOSIS — E512 Wernicke's encephalopathy: Secondary | ICD-10-CM | POA: Diagnosis not present

## 2021-10-27 DIAGNOSIS — M6259 Muscle wasting and atrophy, not elsewhere classified, multiple sites: Secondary | ICD-10-CM | POA: Diagnosis not present

## 2021-10-27 NOTE — Progress Notes (Signed)
Location:  [facility] Room Number: 852D Place of Service:  SNF (31) Provider:  Durenda Age, DNP, FNP-BC  Patient Care Team: Billie Ruddy, MD as PCP - General (Family Medicine)  Extended Emergency Contact Information Primary Emergency Contact: [place] Mobile Phone: [phone] Relation: Son Secondary Emergency Contact: Tomaszewski,Tamieka Address: [address]          [place], [place] [address] [place] Mobile Phone: [phone] Relation: Niece  Code Status:  DNR  Goals of care: Advanced Directive information    10/27/2021   11:58 AM  Advanced Directives  Does Patient Have a Medical Advance Directive? Yes  Type of Advance Directive Out of facility DNR (pink MOST or yellow form)  Does patient want to make changes to medical advance directive? No - Patient declined     Chief Complaint  Patient presents with   Medical Management of Chronic Issues    Routine Visit     HPI:  Pt is a 71 y.o. female seen today for medical management of chronic diseases. She is a long-term care resident of [facility].  Essential hypertension SBPs ranging from 106 to 132, She takes metoprolol tartrate 25 mg twice daily  Sinus tachycardia -  HRs ranging from 80-82, with an outlier 56, takes Metoprolol tartrate 25 mg BID  Centrilobular emphysema (HCC) no wheezing nor SB, takes PRN Albuterol and Combivent Respimat 20-100 mcg inhale 1 puff into the lungs BID  Anxiety -  denies anxiety, family comes to visit daily, take Ativan 0.5 mg twice a day    Past Medical History:  Diagnosis Date   Hypertension    Polycythemia vera(238.4)    Past Surgical History:  Procedure Laterality Date   BRONCHIAL WASHINGS  07/16/2019   Procedure: BRONCHIAL WASHINGS;  Surgeon: Rigoberto Noel, MD;  Location: [facility] ENDOSCOPY;  Service: Cardiopulmonary;;   VIDEO BRONCHOSCOPY N/A 07/16/2019   Procedure: VIDEO  BRONCHOSCOPY WITHOUT FLUORO;  Surgeon: Rigoberto Noel, MD;  Location: [facility] ENDOSCOPY;  Service: Cardiopulmonary;  Laterality: N/A;    No Known Allergies  Outpatient Encounter Medications as of 10/27/2021  Medication Sig   acetaminophen (TYLENOL) 325 MG tablet Take 650 mg by mouth as needed (For pain.).   Albuterol Sulfate (PROAIR RESPICLICK) 093 (90 Base) MCG/ACT AEPB Inhale 2 puffs into the lungs every 6 (six) hours as needed (For Wheezing.).   bisacodyl (DULCOLAX) 10 MG suppository If not relieved by MOM, give 10 mg Bisacodyl suppositiory rectally X 1 dose in 24 hours as needed   Blood Pressure Monitoring (BLOOD PRESSURE CUFF) MISC Use daily as directed.   feeding supplement (ENSURE ENLIVE / ENSURE PLUS) LIQD Take 237 mLs by mouth 2 (two) times daily between meals.   folic acid (FOLVITE) 1 MG tablet Take 1 tablet (1 mg total) by mouth daily.   Ipratropium-Albuterol (COMBIVENT) 20-100 MCG/ACT AERS respimat Inhale 1 puff into the lungs in the morning and at bedtime.   LORazepam (ATIVAN) 0.5 MG tablet Take 1 tablet (0.5 mg total) by mouth 2 (two) times daily.   Magnesium Hydroxide (MILK OF MAGNESIA PO) If no BM in 3 days, give 30 cc Milk of Magnesium p.o. x 1 dose in 24 hours as needed (Do not use standing constipation orders for residents with renal failure CFR less than 30. Contact MD for orders)   melatonin 5 MG TABS Take 5 mg by mouth at bedtime.   metoprolol tartrate (LOPRESSOR) 25 MG tablet Take 25  mg by mouth 2 (two) times daily.   Multiple Vitamins-Minerals (CENTRUM SILVER 50+WOMEN) TABS Take 1 tablet by mouth daily.   NON FORMULARY Diet:Mech soft/thin consistency   Nutritional Supplements (NUTRITIONAL SUPPLEMENT PO) Take 240 mLs by mouth in the morning and at bedtime. BOOST   Nutritional Supplements (NUTRITIONAL SUPPLEMENT PO) Take by mouth. MAGIC CUP TWICE DAILY DIETARY SUPPLEMEMT   omeprazole (PRILOSEC) 20 MG capsule Take 1 capsule (20 mg total) by mouth daily.   OXYGEN Inhale into the  lungs.   Sodium Phosphates (RA SALINE ENEMA RE) If not relieved by Biscodyl suppository, give disposable Saline Enema rectally X 1 dose/24 hrs as needed   SYRINGE-NEEDLE, DISP, 3 ML (BD ECLIPSE SYRINGE) 25G X 5/8" 3 ML MISC Use as directed   thiamine 100 MG tablet Take 2 tablets (200 mg total) by mouth daily.   No facility-administered encounter medications on file as of 10/27/2021.    Review of Systems  Constitutional:  Negative for appetite change, chills, fatigue and fever.  HENT:  Negative for congestion, hearing loss, rhinorrhea and sore throat.   Eyes: Negative.   Respiratory:  Negative for cough, shortness of breath and wheezing.   Cardiovascular:  Negative for chest pain, palpitations and leg swelling.  Gastrointestinal:  Negative for abdominal pain, constipation, diarrhea, nausea and vomiting.  Genitourinary:  Negative for dysuria.  Musculoskeletal:  Negative for arthralgias, back pain and myalgias.  Skin:  Negative for color change, rash and wound.  Neurological:  Negative for dizziness, weakness and headaches.  Psychiatric/Behavioral:  Negative for behavioral problems. The patient is not nervous/anxious.       Immunization History  Administered Date(s) Administered   DTaP 02/02/2014   Fluad Quad(high Dose 65+) 02/02/2021   Unspecified SARS-COV-2 Vaccination 08/03/2019, 09/03/2019   Zoster Recombinat (Shingrix) 03/04/2016   Pertinent  Health Maintenance Due  Topic Date Due   COLONOSCOPY (Pts 45-56yr Insurance coverage will need to be confirmed)  Never done   MAMMOGRAM  Never done   DEXA SCAN  Never done   INFLUENZA VACCINE  01/02/2022      07/08/2021    8:05 AM 07/08/2021    9:41 PM 07/09/2021    7:52 AM 07/09/2021   10:00 PM 07/10/2021   12:00 PM  Fall Risk  Patient Fall Risk Level High fall risk High fall risk High fall risk High fall risk High fall risk     Vitals:   10/27/21 1201  BP: 131/81  Pulse: 80  Resp: 18  Temp: (!) 97.2 F (36.2 C)  SpO2: 96%   Weight: 127 lb 6.4 oz (57.8 kg)  Height: '5\' 2"'$  (1.575 m)   Body mass index is 23.3 kg/m.  Physical Exam Constitutional:      General: She is not in acute distress.    Appearance: Normal appearance.  HENT:     Head: Normocephalic and atraumatic.     Nose: Nose normal.     Mouth/Throat:     Mouth: Mucous membranes are moist.  Eyes:     Conjunctiva/sclera: Conjunctivae normal.  Cardiovascular:     Rate and Rhythm: Normal rate and regular rhythm.  Pulmonary:     Effort: Pulmonary effort is normal.     Breath sounds: Normal breath sounds.  Abdominal:     General: Bowel sounds are normal.     Palpations: Abdomen is soft.  Musculoskeletal:        General: Normal range of motion.     Cervical back: Normal range of motion.  Skin:    General: Skin is warm and dry.  Neurological:     Mental Status: She is alert. Mental status is at baseline. She is disoriented.     Comments: Alert to self, disoriented to time and place.  Psychiatric:        Mood and Affect: Mood normal.        Behavior: Behavior normal.       Labs reviewed: Recent Labs    07/03/21 0500 07/04/21 1528 07/05/21 0517 07/08/21 2237 07/12/21 0000 08/01/21 0000  NA 135 137 134* 134* 138 144  K 3.1* 4.6 4.2 3.5 4.3 3.7  CL 101 107 110 105 104 106  CO2 26 25 20* 25 27* 26*  GLUCOSE 94 78 73 113*  --   --   BUN 5* 6* 5* '8 8 9  '$ CREATININE 0.54 0.59 0.51 0.44 0.6 0.6  CALCIUM 8.0* 8.3* 7.7* 8.1* 8.4* 9.3  MG 1.5*  --   --  1.8  --   --    Recent Labs    07/04/21 1528 07/05/21 0517 07/08/21 2237 07/12/21 0000 08/01/21 0000  AST 67* 50* 45* 47* 39*  ALT '24 20 20 18 19  '$ ALKPHOS 119 90 105 113 105  BILITOT 1.6* 1.1 0.7  --   --   PROT 6.7 5.3* 5.6*  --   --   ALBUMIN 2.3* 1.8* 1.8* 2.4* 2.7*   Recent Labs    06/15/21 1409 06/17/21 2111 07/02/21 1943 07/02/21 2019 07/04/21 1528 07/05/21 0517 07/07/21 0724  WBC 6.7   < > 8.4  --  9.3 8.4 7.7  NEUTROABS 4.1  --  4.1  --   --   --   --   HGB  13.5   < > 13.9   < > 14.7 12.1 12.9  HCT 41.2   < > 40.7   < > 44.1 35.2* 37.1  MCV 101.1*   < > 96.4  --  101.6* 98.9 96.9  PLT 178.0   < > 251  --  236 215 210   < > = values in this interval not displayed.   Lab Results  Component Value Date   TSH 1.52 06/15/2021   Lab Results  Component Value Date   HGBA1C 4.7 08/01/2021   Lab Results  Component Value Date   CHOL 146 06/15/2021   HDL 26.60 (L) 06/15/2021   LDLCALC 93 06/15/2021   TRIG 129.0 06/15/2021   CHOLHDL 5 06/15/2021    Significant Diagnostic Results in last 30 days:  No results found.  Assessment/Plan  1. Essential hypertension --Blood pressure well controlled Continue current medication  2. Sinus tachycardia HRs rate-controlled, continue Metoprolol tartrate  3. Centrilobular emphysema (HCC) -   no SOB/wheezing, continue Combivent Respimat and PRN Albuterol  4. Anxiety -  mood is stable, continue Ativan    Family/ staff Communication: Discussed plan of care with resident and charge nurse  Labs/tests ordered:   None    Durenda Age, DNP, MSN, FNP-BC [facility] [phone] (Monday-Friday 8:00 a.m. - 5:00 p.m.) [phone] (after hours)

## 2021-10-31 DIAGNOSIS — M6259 Muscle wasting and atrophy, not elsewhere classified, multiple sites: Secondary | ICD-10-CM | POA: Diagnosis not present

## 2021-10-31 DIAGNOSIS — Z741 Need for assistance with personal care: Secondary | ICD-10-CM | POA: Diagnosis not present

## 2021-10-31 DIAGNOSIS — E512 Wernicke's encephalopathy: Secondary | ICD-10-CM | POA: Diagnosis not present

## 2021-10-31 DIAGNOSIS — R2681 Unsteadiness on feet: Secondary | ICD-10-CM | POA: Diagnosis not present

## 2021-10-31 DIAGNOSIS — R2689 Other abnormalities of gait and mobility: Secondary | ICD-10-CM | POA: Diagnosis not present

## 2021-10-31 DIAGNOSIS — M6281 Muscle weakness (generalized): Secondary | ICD-10-CM | POA: Diagnosis not present

## 2021-11-14 DIAGNOSIS — I10 Essential (primary) hypertension: Secondary | ICD-10-CM | POA: Diagnosis not present

## 2021-12-07 DIAGNOSIS — D696 Thrombocytopenia, unspecified: Secondary | ICD-10-CM | POA: Diagnosis not present

## 2021-12-07 DIAGNOSIS — I10 Essential (primary) hypertension: Secondary | ICD-10-CM | POA: Diagnosis not present

## 2021-12-07 DIAGNOSIS — M6259 Muscle wasting and atrophy, not elsewhere classified, multiple sites: Secondary | ICD-10-CM | POA: Diagnosis not present

## 2021-12-07 DIAGNOSIS — E512 Wernicke's encephalopathy: Secondary | ICD-10-CM | POA: Diagnosis not present

## 2021-12-07 DIAGNOSIS — R296 Repeated falls: Secondary | ICD-10-CM | POA: Diagnosis not present

## 2021-12-13 DIAGNOSIS — I10 Essential (primary) hypertension: Secondary | ICD-10-CM | POA: Diagnosis not present

## 2021-12-13 DIAGNOSIS — Z049 Encounter for examination and observation for unspecified reason: Secondary | ICD-10-CM | POA: Diagnosis not present

## 2022-01-05 DIAGNOSIS — R278 Other lack of coordination: Secondary | ICD-10-CM | POA: Diagnosis not present

## 2022-01-05 DIAGNOSIS — M6259 Muscle wasting and atrophy, not elsewhere classified, multiple sites: Secondary | ICD-10-CM | POA: Diagnosis not present

## 2022-01-08 DIAGNOSIS — M6259 Muscle wasting and atrophy, not elsewhere classified, multiple sites: Secondary | ICD-10-CM | POA: Diagnosis not present

## 2022-01-08 DIAGNOSIS — R278 Other lack of coordination: Secondary | ICD-10-CM | POA: Diagnosis not present

## 2022-01-09 DIAGNOSIS — R278 Other lack of coordination: Secondary | ICD-10-CM | POA: Diagnosis not present

## 2022-01-09 DIAGNOSIS — M6259 Muscle wasting and atrophy, not elsewhere classified, multiple sites: Secondary | ICD-10-CM | POA: Diagnosis not present

## 2022-01-10 DIAGNOSIS — M6259 Muscle wasting and atrophy, not elsewhere classified, multiple sites: Secondary | ICD-10-CM | POA: Diagnosis not present

## 2022-01-10 DIAGNOSIS — R278 Other lack of coordination: Secondary | ICD-10-CM | POA: Diagnosis not present

## 2022-01-11 DIAGNOSIS — R278 Other lack of coordination: Secondary | ICD-10-CM | POA: Diagnosis not present

## 2022-01-11 DIAGNOSIS — M6259 Muscle wasting and atrophy, not elsewhere classified, multiple sites: Secondary | ICD-10-CM | POA: Diagnosis not present

## 2022-01-12 DIAGNOSIS — M6259 Muscle wasting and atrophy, not elsewhere classified, multiple sites: Secondary | ICD-10-CM | POA: Diagnosis not present

## 2022-01-12 DIAGNOSIS — R278 Other lack of coordination: Secondary | ICD-10-CM | POA: Diagnosis not present

## 2022-01-14 DIAGNOSIS — M6259 Muscle wasting and atrophy, not elsewhere classified, multiple sites: Secondary | ICD-10-CM | POA: Diagnosis not present

## 2022-01-14 DIAGNOSIS — R278 Other lack of coordination: Secondary | ICD-10-CM | POA: Diagnosis not present

## 2022-01-15 DIAGNOSIS — R278 Other lack of coordination: Secondary | ICD-10-CM | POA: Diagnosis not present

## 2022-01-15 DIAGNOSIS — M6259 Muscle wasting and atrophy, not elsewhere classified, multiple sites: Secondary | ICD-10-CM | POA: Diagnosis not present

## 2022-01-16 DIAGNOSIS — M6259 Muscle wasting and atrophy, not elsewhere classified, multiple sites: Secondary | ICD-10-CM | POA: Diagnosis not present

## 2022-01-16 DIAGNOSIS — R278 Other lack of coordination: Secondary | ICD-10-CM | POA: Diagnosis not present

## 2022-01-17 DIAGNOSIS — M6259 Muscle wasting and atrophy, not elsewhere classified, multiple sites: Secondary | ICD-10-CM | POA: Diagnosis not present

## 2022-01-17 DIAGNOSIS — R278 Other lack of coordination: Secondary | ICD-10-CM | POA: Diagnosis not present

## 2022-01-19 DIAGNOSIS — R278 Other lack of coordination: Secondary | ICD-10-CM | POA: Diagnosis not present

## 2022-01-19 DIAGNOSIS — M6259 Muscle wasting and atrophy, not elsewhere classified, multiple sites: Secondary | ICD-10-CM | POA: Diagnosis not present

## 2022-01-22 DIAGNOSIS — R278 Other lack of coordination: Secondary | ICD-10-CM | POA: Diagnosis not present

## 2022-01-22 DIAGNOSIS — M6259 Muscle wasting and atrophy, not elsewhere classified, multiple sites: Secondary | ICD-10-CM | POA: Diagnosis not present

## 2022-01-23 DIAGNOSIS — M6259 Muscle wasting and atrophy, not elsewhere classified, multiple sites: Secondary | ICD-10-CM | POA: Diagnosis not present

## 2022-01-23 DIAGNOSIS — R278 Other lack of coordination: Secondary | ICD-10-CM | POA: Diagnosis not present

## 2022-01-24 DIAGNOSIS — M6259 Muscle wasting and atrophy, not elsewhere classified, multiple sites: Secondary | ICD-10-CM | POA: Diagnosis not present

## 2022-01-24 DIAGNOSIS — R278 Other lack of coordination: Secondary | ICD-10-CM | POA: Diagnosis not present

## 2022-01-25 DIAGNOSIS — R278 Other lack of coordination: Secondary | ICD-10-CM | POA: Diagnosis not present

## 2022-01-25 DIAGNOSIS — M6259 Muscle wasting and atrophy, not elsewhere classified, multiple sites: Secondary | ICD-10-CM | POA: Diagnosis not present

## 2022-01-26 DIAGNOSIS — R278 Other lack of coordination: Secondary | ICD-10-CM | POA: Diagnosis not present

## 2022-01-26 DIAGNOSIS — M6259 Muscle wasting and atrophy, not elsewhere classified, multiple sites: Secondary | ICD-10-CM | POA: Diagnosis not present

## 2022-02-01 DIAGNOSIS — U071 COVID-19: Secondary | ICD-10-CM | POA: Diagnosis not present

## 2024-04-23 ENCOUNTER — Other Ambulatory Visit: Payer: Self-pay | Admitting: Internal Medicine

## 2024-04-23 DIAGNOSIS — Z1231 Encounter for screening mammogram for malignant neoplasm of breast: Secondary | ICD-10-CM

## 2024-04-24 ENCOUNTER — Other Ambulatory Visit: Payer: Self-pay

## 2024-04-24 DIAGNOSIS — J432 Centrilobular emphysema: Secondary | ICD-10-CM

## 2024-06-15 ENCOUNTER — Inpatient Hospital Stay: Admission: RE | Admit: 2024-06-15 | Discharge: 2024-06-15 | Disposition: A | Source: Ambulatory Visit

## 2024-06-15 DIAGNOSIS — J432 Centrilobular emphysema: Secondary | ICD-10-CM

## 2024-06-18 ENCOUNTER — Ambulatory Visit
Admission: RE | Admit: 2024-06-18 | Discharge: 2024-06-18 | Disposition: A | Payer: Medicare (Managed Care) | Source: Ambulatory Visit | Attending: Internal Medicine | Admitting: Internal Medicine

## 2024-06-18 DIAGNOSIS — Z1231 Encounter for screening mammogram for malignant neoplasm of breast: Secondary | ICD-10-CM
